# Patient Record
Sex: Female | Born: 1935 | Race: White | Hispanic: No | Smoking: Former smoker
Health system: Southern US, Community
[De-identification: ages and names within clinical notes are randomized; demographics above are authoritative.]

## PROBLEM LIST (undated history)

## (undated) DIAGNOSIS — Z79899 Other long term (current) drug therapy: Secondary | ICD-10-CM

## (undated) DIAGNOSIS — I5022 Chronic systolic (congestive) heart failure: Secondary | ICD-10-CM

## (undated) DIAGNOSIS — D509 Iron deficiency anemia, unspecified: Secondary | ICD-10-CM

## (undated) DIAGNOSIS — E785 Hyperlipidemia, unspecified: Secondary | ICD-10-CM

## (undated) DIAGNOSIS — F329 Major depressive disorder, single episode, unspecified: Secondary | ICD-10-CM

## (undated) DIAGNOSIS — I2510 Atherosclerotic heart disease of native coronary artery without angina pectoris: Secondary | ICD-10-CM

## (undated) DIAGNOSIS — F32A Depression, unspecified: Secondary | ICD-10-CM

## (undated) DIAGNOSIS — I447 Left bundle-branch block, unspecified: Secondary | ICD-10-CM

## (undated) DIAGNOSIS — F419 Anxiety disorder, unspecified: Secondary | ICD-10-CM

## (undated) DIAGNOSIS — Z7901 Long term (current) use of anticoagulants: Secondary | ICD-10-CM

## (undated) DIAGNOSIS — I10 Essential (primary) hypertension: Secondary | ICD-10-CM

## (undated) DIAGNOSIS — I495 Sick sinus syndrome: Secondary | ICD-10-CM

## (undated) DIAGNOSIS — I609 Nontraumatic subarachnoid hemorrhage, unspecified: Secondary | ICD-10-CM

## (undated) DIAGNOSIS — I4891 Unspecified atrial fibrillation: Secondary | ICD-10-CM

## (undated) DIAGNOSIS — I340 Nonrheumatic mitral (valve) insufficiency: Secondary | ICD-10-CM

## (undated) HISTORY — DX: Depression, unspecified: F32.A

## (undated) HISTORY — DX: Major depressive disorder, single episode, unspecified: F32.9

## (undated) HISTORY — DX: Essential (primary) hypertension: I10

## (undated) HISTORY — DX: Nontraumatic subarachnoid hemorrhage, unspecified: I60.9

## (undated) HISTORY — DX: Anxiety disorder, unspecified: F41.9

## (undated) HISTORY — DX: Other long term (current) drug therapy: Z79.899

## (undated) HISTORY — DX: Unspecified atrial fibrillation: I48.91

## (undated) HISTORY — PX: CORONARY ARTERY BYPASS GRAFT: SHX141

## (undated) HISTORY — DX: Atherosclerotic heart disease of native coronary artery without angina pectoris: I25.10

## (undated) HISTORY — DX: Nonrheumatic mitral (valve) insufficiency: I34.0

## (undated) HISTORY — DX: Sick sinus syndrome: I49.5

## (undated) HISTORY — DX: Hyperlipidemia, unspecified: E78.5

## (undated) HISTORY — PX: CORONARY STENT PLACEMENT: SHX1402

## (undated) HISTORY — PX: OTHER SURGICAL HISTORY: SHX169

## (undated) HISTORY — DX: Iron deficiency anemia, unspecified: D50.9

## (undated) HISTORY — DX: Long term (current) use of anticoagulants: Z79.01

## (undated) HISTORY — DX: Left bundle-branch block, unspecified: I44.7

## (undated) HISTORY — PX: MITRAL VALVE REPAIR: SHX2039

---

## 2004-06-21 ENCOUNTER — Inpatient Hospital Stay (HOSPITAL_COMMUNITY): Admission: AD | Admit: 2004-06-21 | Discharge: 2004-06-26 | Payer: Self-pay | Admitting: *Deleted

## 2004-06-22 ENCOUNTER — Encounter (INDEPENDENT_AMBULATORY_CARE_PROVIDER_SITE_OTHER): Payer: Self-pay | Admitting: *Deleted

## 2005-12-31 ENCOUNTER — Encounter: Admission: RE | Admit: 2005-12-31 | Discharge: 2005-12-31 | Payer: Self-pay | Admitting: Cardiology

## 2007-03-14 ENCOUNTER — Inpatient Hospital Stay (HOSPITAL_COMMUNITY): Admission: EM | Admit: 2007-03-14 | Discharge: 2007-03-21 | Payer: Self-pay | Admitting: Emergency Medicine

## 2007-03-14 ENCOUNTER — Ambulatory Visit: Payer: Self-pay | Admitting: Pulmonary Disease

## 2007-03-14 ENCOUNTER — Encounter (INDEPENDENT_AMBULATORY_CARE_PROVIDER_SITE_OTHER): Payer: Self-pay | Admitting: Nephrology

## 2007-03-19 HISTORY — PX: CARDIAC CATHETERIZATION: SHX172

## 2007-08-08 ENCOUNTER — Inpatient Hospital Stay (HOSPITAL_COMMUNITY): Admission: EM | Admit: 2007-08-08 | Discharge: 2007-08-10 | Payer: Self-pay | Admitting: Emergency Medicine

## 2007-11-16 ENCOUNTER — Inpatient Hospital Stay (HOSPITAL_COMMUNITY): Admission: EM | Admit: 2007-11-16 | Discharge: 2007-11-20 | Payer: Self-pay | Admitting: Emergency Medicine

## 2007-11-18 ENCOUNTER — Encounter (INDEPENDENT_AMBULATORY_CARE_PROVIDER_SITE_OTHER): Payer: Self-pay | Admitting: Internal Medicine

## 2007-11-20 ENCOUNTER — Emergency Department (HOSPITAL_COMMUNITY): Admission: EM | Admit: 2007-11-20 | Discharge: 2007-11-21 | Payer: Self-pay | Admitting: Emergency Medicine

## 2007-11-29 ENCOUNTER — Ambulatory Visit: Payer: Self-pay | Admitting: Cardiology

## 2007-11-29 ENCOUNTER — Inpatient Hospital Stay (HOSPITAL_COMMUNITY): Admission: EM | Admit: 2007-11-29 | Discharge: 2007-12-04 | Payer: Self-pay | Admitting: Emergency Medicine

## 2008-01-05 ENCOUNTER — Inpatient Hospital Stay (HOSPITAL_COMMUNITY): Admission: AD | Admit: 2008-01-05 | Discharge: 2008-01-08 | Payer: Self-pay | Admitting: Cardiology

## 2008-03-08 ENCOUNTER — Inpatient Hospital Stay (HOSPITAL_COMMUNITY): Admission: EM | Admit: 2008-03-08 | Discharge: 2008-03-12 | Payer: Self-pay | Admitting: Emergency Medicine

## 2008-03-25 ENCOUNTER — Emergency Department (HOSPITAL_COMMUNITY): Admission: EM | Admit: 2008-03-25 | Discharge: 2008-03-26 | Payer: Self-pay | Admitting: Emergency Medicine

## 2008-04-07 HISTORY — PX: CORONARY ARTERY BYPASS GRAFT: SHX141

## 2008-05-03 ENCOUNTER — Inpatient Hospital Stay (HOSPITAL_COMMUNITY): Admission: EM | Admit: 2008-05-03 | Discharge: 2008-05-30 | Payer: Self-pay | Admitting: Emergency Medicine

## 2008-05-03 ENCOUNTER — Encounter: Payer: Self-pay | Admitting: Critical Care Medicine

## 2008-05-03 ENCOUNTER — Ambulatory Visit: Payer: Self-pay | Admitting: Critical Care Medicine

## 2008-05-07 ENCOUNTER — Encounter: Payer: Self-pay | Admitting: Cardiothoracic Surgery

## 2008-05-08 ENCOUNTER — Ambulatory Visit: Payer: Self-pay | Admitting: Cardiothoracic Surgery

## 2008-05-10 ENCOUNTER — Encounter: Payer: Self-pay | Admitting: Cardiothoracic Surgery

## 2008-05-11 ENCOUNTER — Encounter: Payer: Self-pay | Admitting: Cardiothoracic Surgery

## 2008-05-19 HISTORY — PX: PACEMAKER INSERTION: SHX728

## 2008-05-26 ENCOUNTER — Ambulatory Visit: Payer: Self-pay | Admitting: Physical Medicine & Rehabilitation

## 2008-06-17 ENCOUNTER — Encounter: Admission: RE | Admit: 2008-06-17 | Discharge: 2008-06-17 | Payer: Self-pay | Admitting: Cardiology

## 2008-06-21 HISTORY — PX: TRANSTHORACIC ECHOCARDIOGRAM: SHX275

## 2008-06-24 ENCOUNTER — Ambulatory Visit: Payer: Self-pay | Admitting: Cardiothoracic Surgery

## 2008-06-24 ENCOUNTER — Encounter: Admission: RE | Admit: 2008-06-24 | Discharge: 2008-06-24 | Payer: Self-pay | Admitting: Cardiothoracic Surgery

## 2008-07-08 ENCOUNTER — Encounter (HOSPITAL_COMMUNITY): Admission: RE | Admit: 2008-07-08 | Discharge: 2008-10-06 | Payer: Self-pay | Admitting: Cardiology

## 2008-09-23 ENCOUNTER — Ambulatory Visit: Payer: Self-pay | Admitting: Cardiothoracic Surgery

## 2008-09-23 ENCOUNTER — Encounter: Admission: RE | Admit: 2008-09-23 | Discharge: 2008-09-23 | Payer: Self-pay | Admitting: Cardiothoracic Surgery

## 2008-10-08 ENCOUNTER — Encounter (HOSPITAL_COMMUNITY): Admission: RE | Admit: 2008-10-08 | Discharge: 2008-10-22 | Payer: Self-pay | Admitting: Cardiology

## 2008-10-23 ENCOUNTER — Encounter (HOSPITAL_COMMUNITY): Admission: RE | Admit: 2008-10-23 | Discharge: 2009-01-21 | Payer: Self-pay | Admitting: Cardiology

## 2009-02-07 ENCOUNTER — Encounter (HOSPITAL_COMMUNITY): Admission: RE | Admit: 2009-02-07 | Discharge: 2009-05-08 | Payer: Self-pay | Admitting: Cardiology

## 2009-05-09 ENCOUNTER — Encounter (HOSPITAL_COMMUNITY): Admission: RE | Admit: 2009-05-09 | Discharge: 2009-08-07 | Payer: Self-pay | Admitting: Cardiology

## 2009-05-11 IMAGING — CR DG CHEST 2V
2 series · 2 of 2 positions shown · non-contrast
Comparison: 11/18/07 and earlier

CLINICAL DATA: 77-year-old female with chest tightness.  
 CHEST ? 2 VIEW:

[w chest pa]
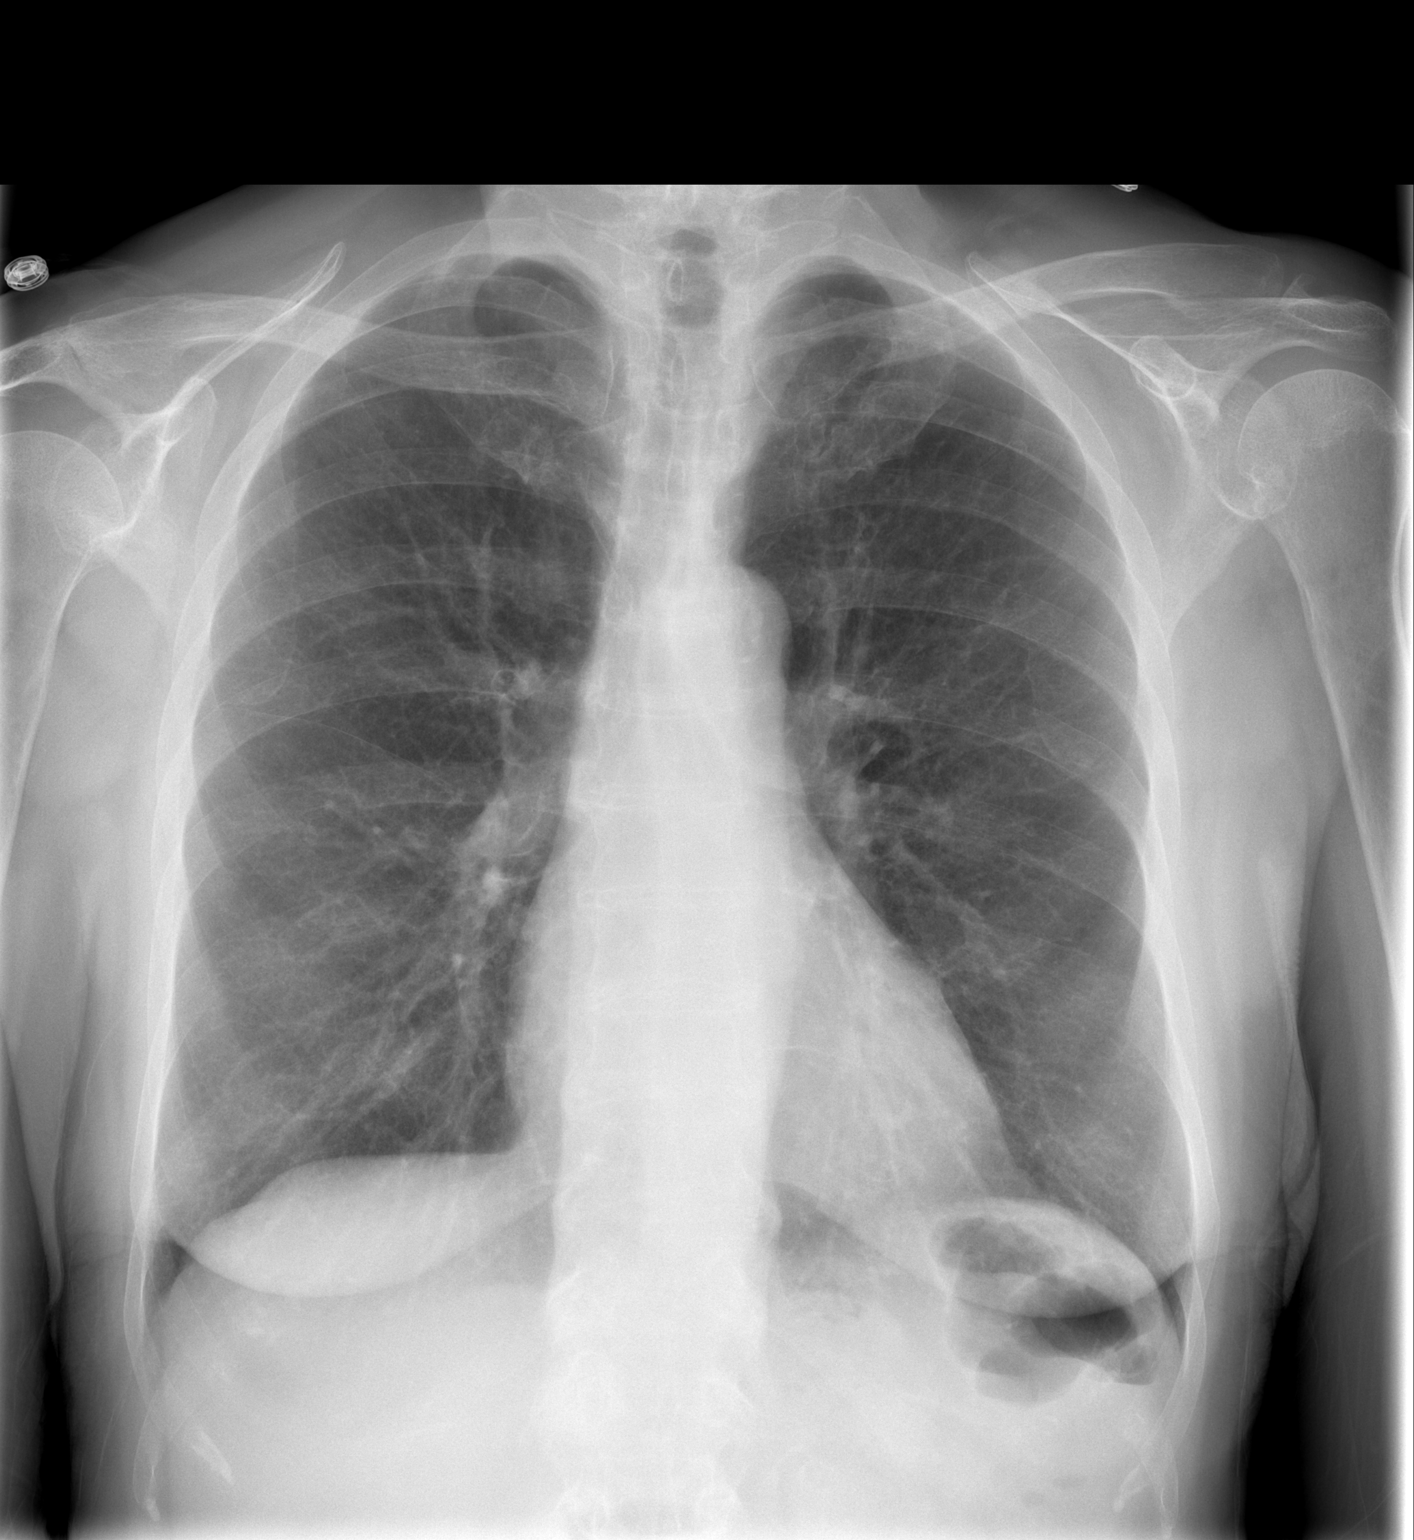

[w chest lat]
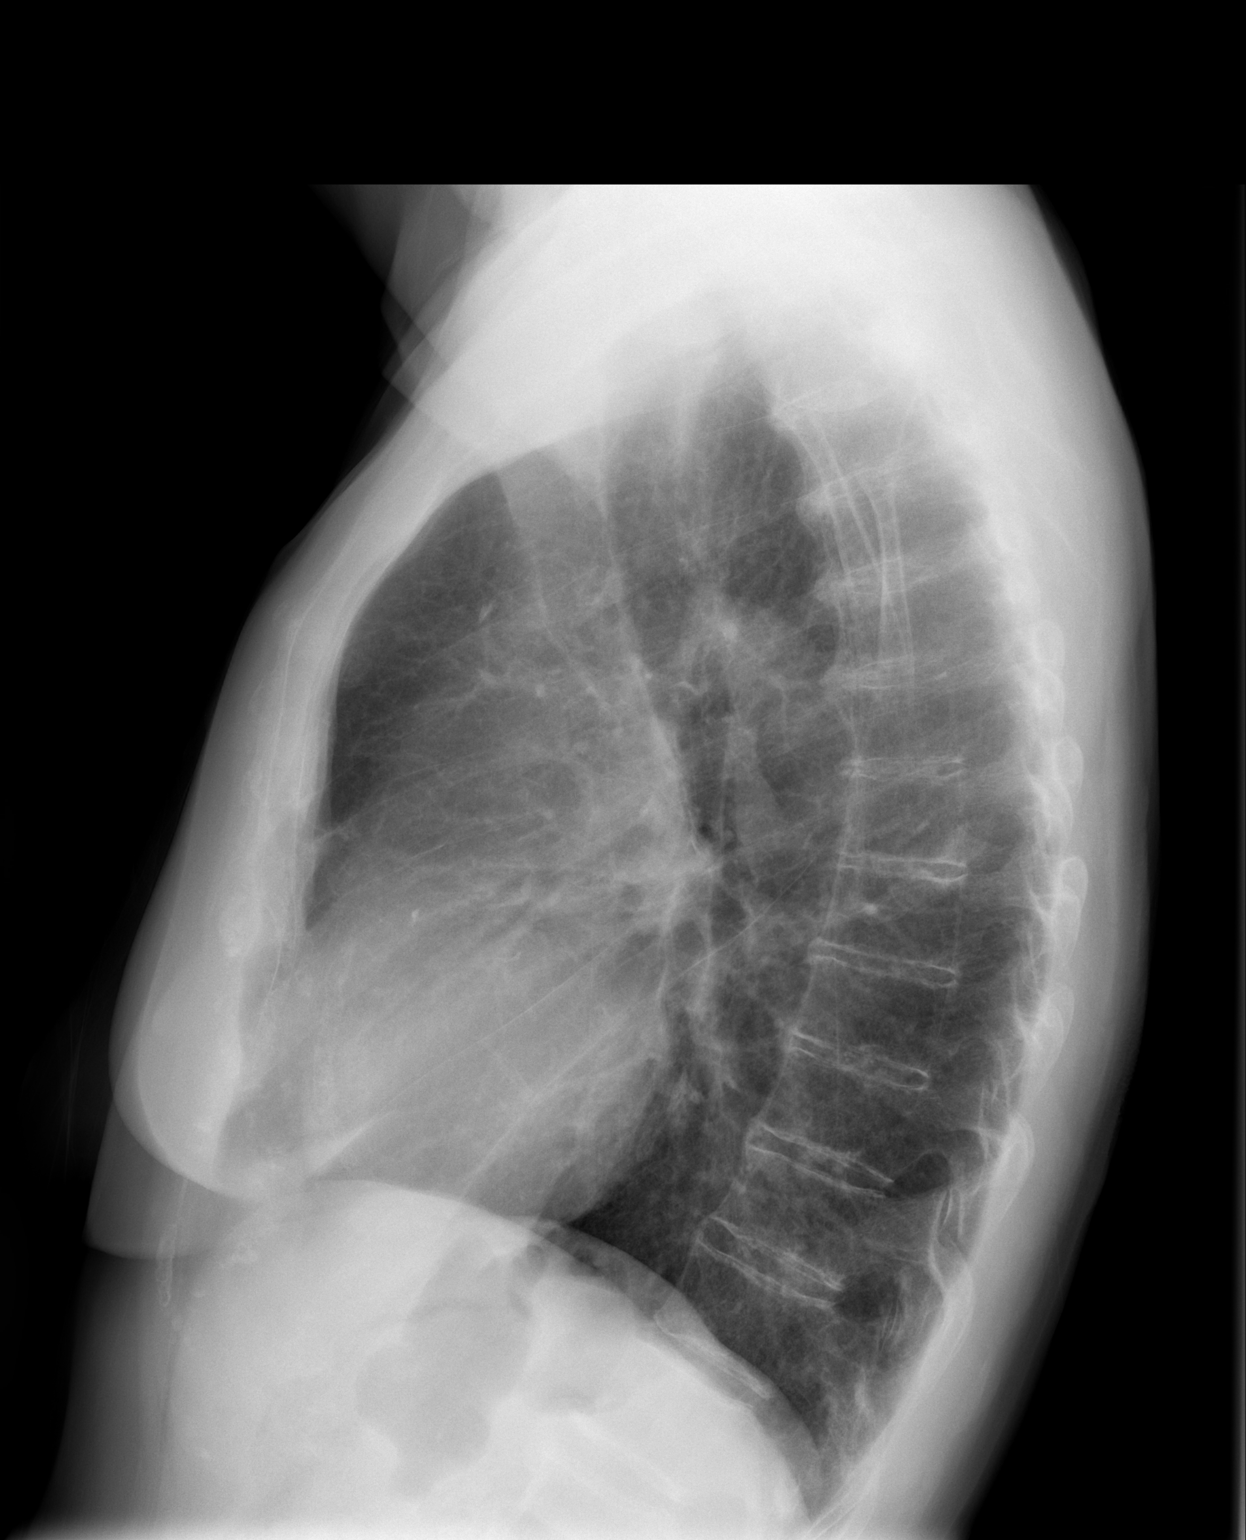

[2 of 2 positions shown; findings below may reference images not displayed]

FINDINGS: Further continued improved ventilation in both lungs.  Interval resolved pleural effusions.  Cardiac size and mediastinal contours remain within normal limits.  No pneumothorax.  Attenuation of vascular markings suggestive of emphysema reidentified.  No acute airspace opacity or consolidation.  Diffuse osteopenia.  No acute osseous abnormality.
IMPRESSION: Interval resolution of small pleural effusions and continued improved ventilation.  No acute cardiopulmonary abnormality.

## 2009-08-08 ENCOUNTER — Encounter (HOSPITAL_COMMUNITY): Admission: RE | Admit: 2009-08-08 | Discharge: 2009-11-06 | Payer: Self-pay | Admitting: Cardiology

## 2009-10-22 IMAGING — CR DG CHEST 1V PORT
1 series · 1 of 1 positions shown · non-contrast
Comparison: 05/03/2008

CLINICAL DATA: Intubation

PORTABLE CHEST - 1 VIEW

[AP]
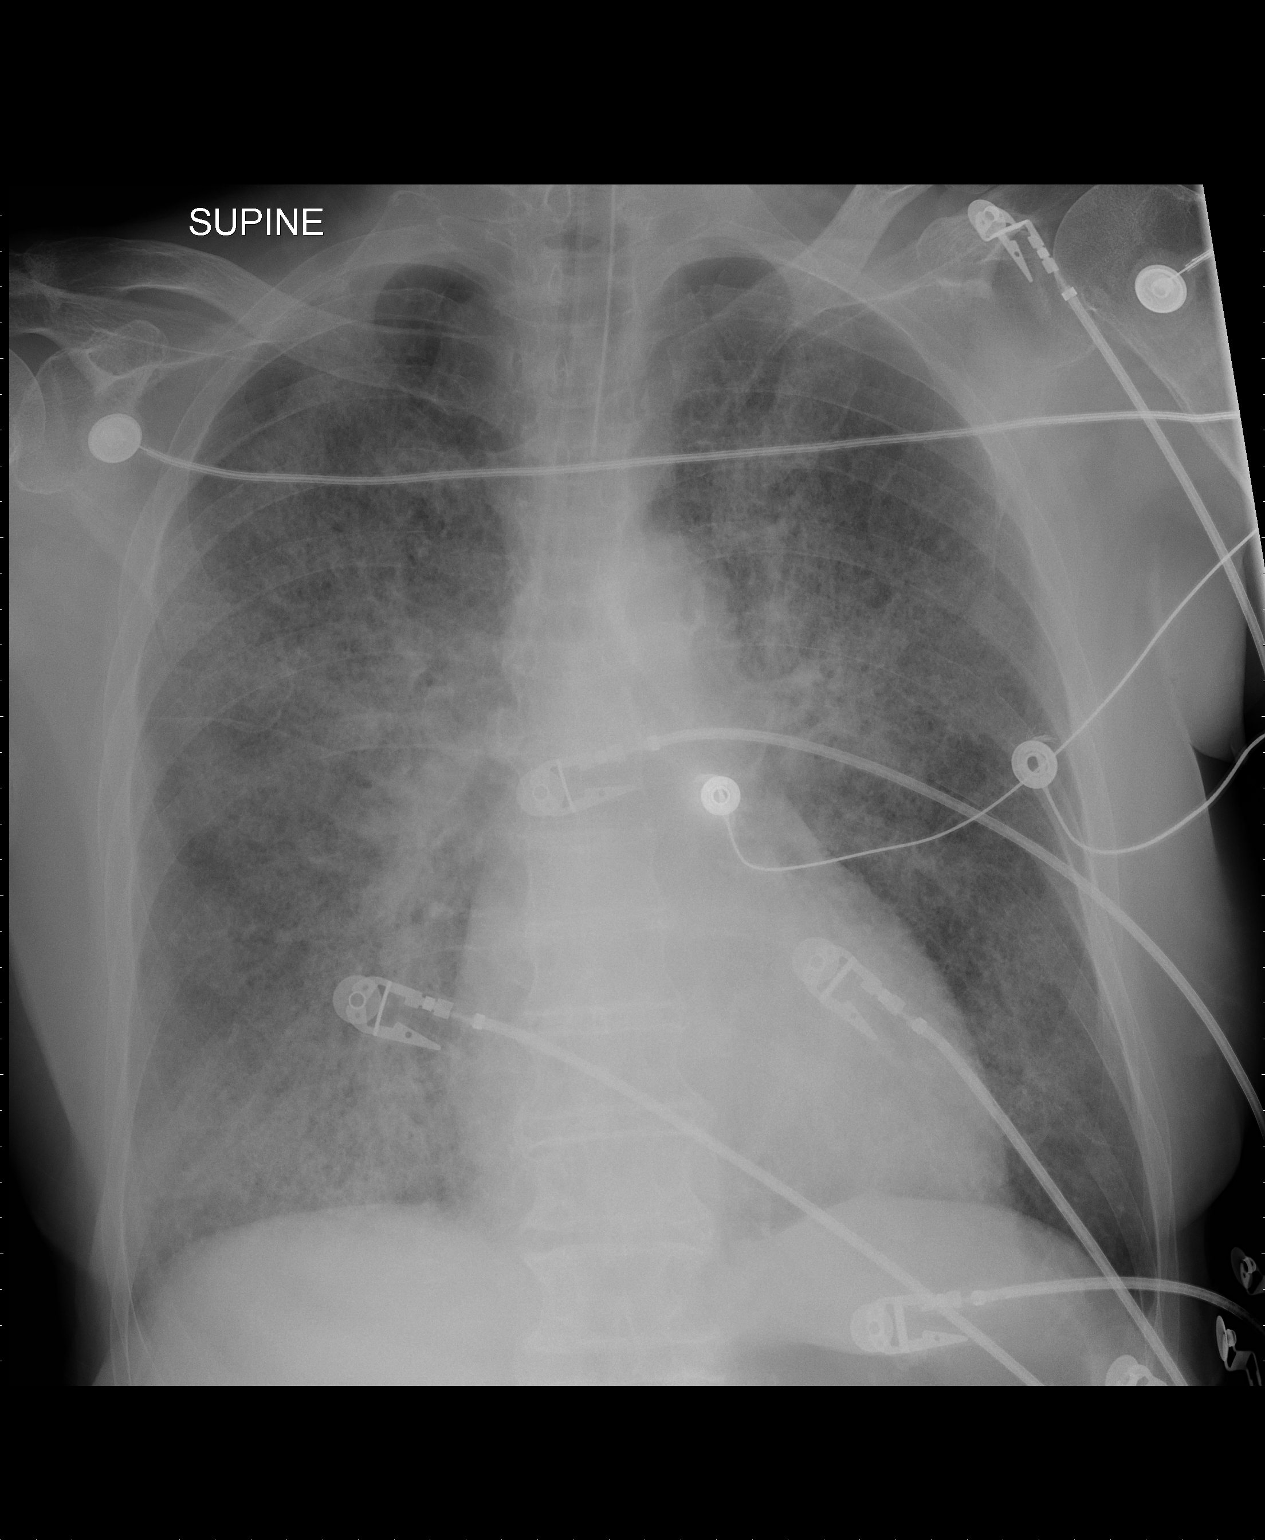

[1 of 1 positions shown; findings below may reference images not displayed]

FINDINGS: Endotracheal tube is 4 cm above the carina.  Continued
diffuse bilateral airspace disease compatible with edema.  Mild
cardiomegaly stable.
IMPRESSION: Endotracheal tube 4 cm above the carina.  Otherwise no change.

## 2009-10-22 IMAGING — CR DG CHEST 1V PORT
1 series · 1 of 1 positions shown · non-contrast
Comparison: 03/26/2008

CLINICAL DATA: Abdominal pain, respiratory distress

PORTABLE CHEST - 1 VIEW

[AP]
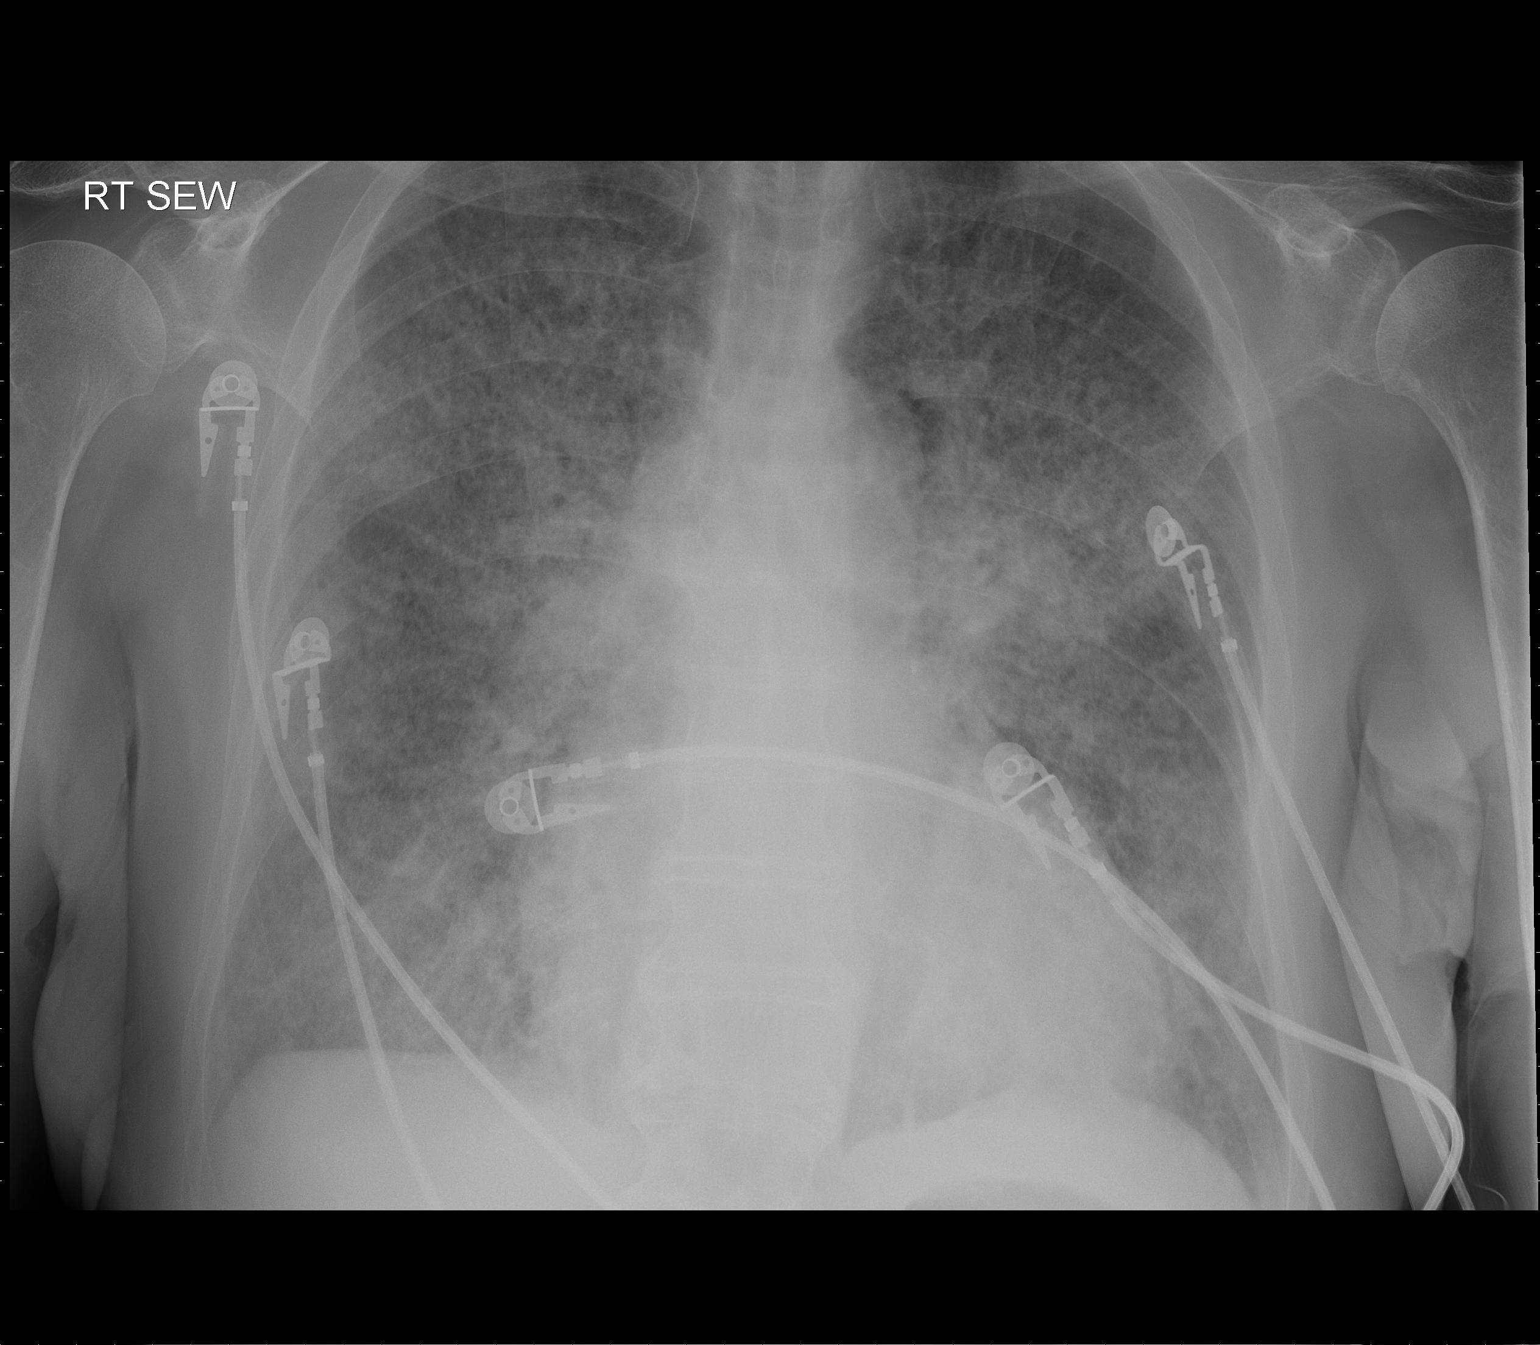

[1 of 1 positions shown; findings below may reference images not displayed]

FINDINGS: Diffuse bilateral airspace disease noted, worsened since
prior study.  There is cardiomegaly.  No effusions.
IMPRESSION: Moderate CHF.

## 2009-10-22 IMAGING — CR DG ABD PORTABLE 1V
1 series · 1 of 1 positions shown · non-contrast
Comparison: None.

CLINICAL DATA: Panda tube placement.

ABDOMEN - 1 VIEW

[view not recorded]
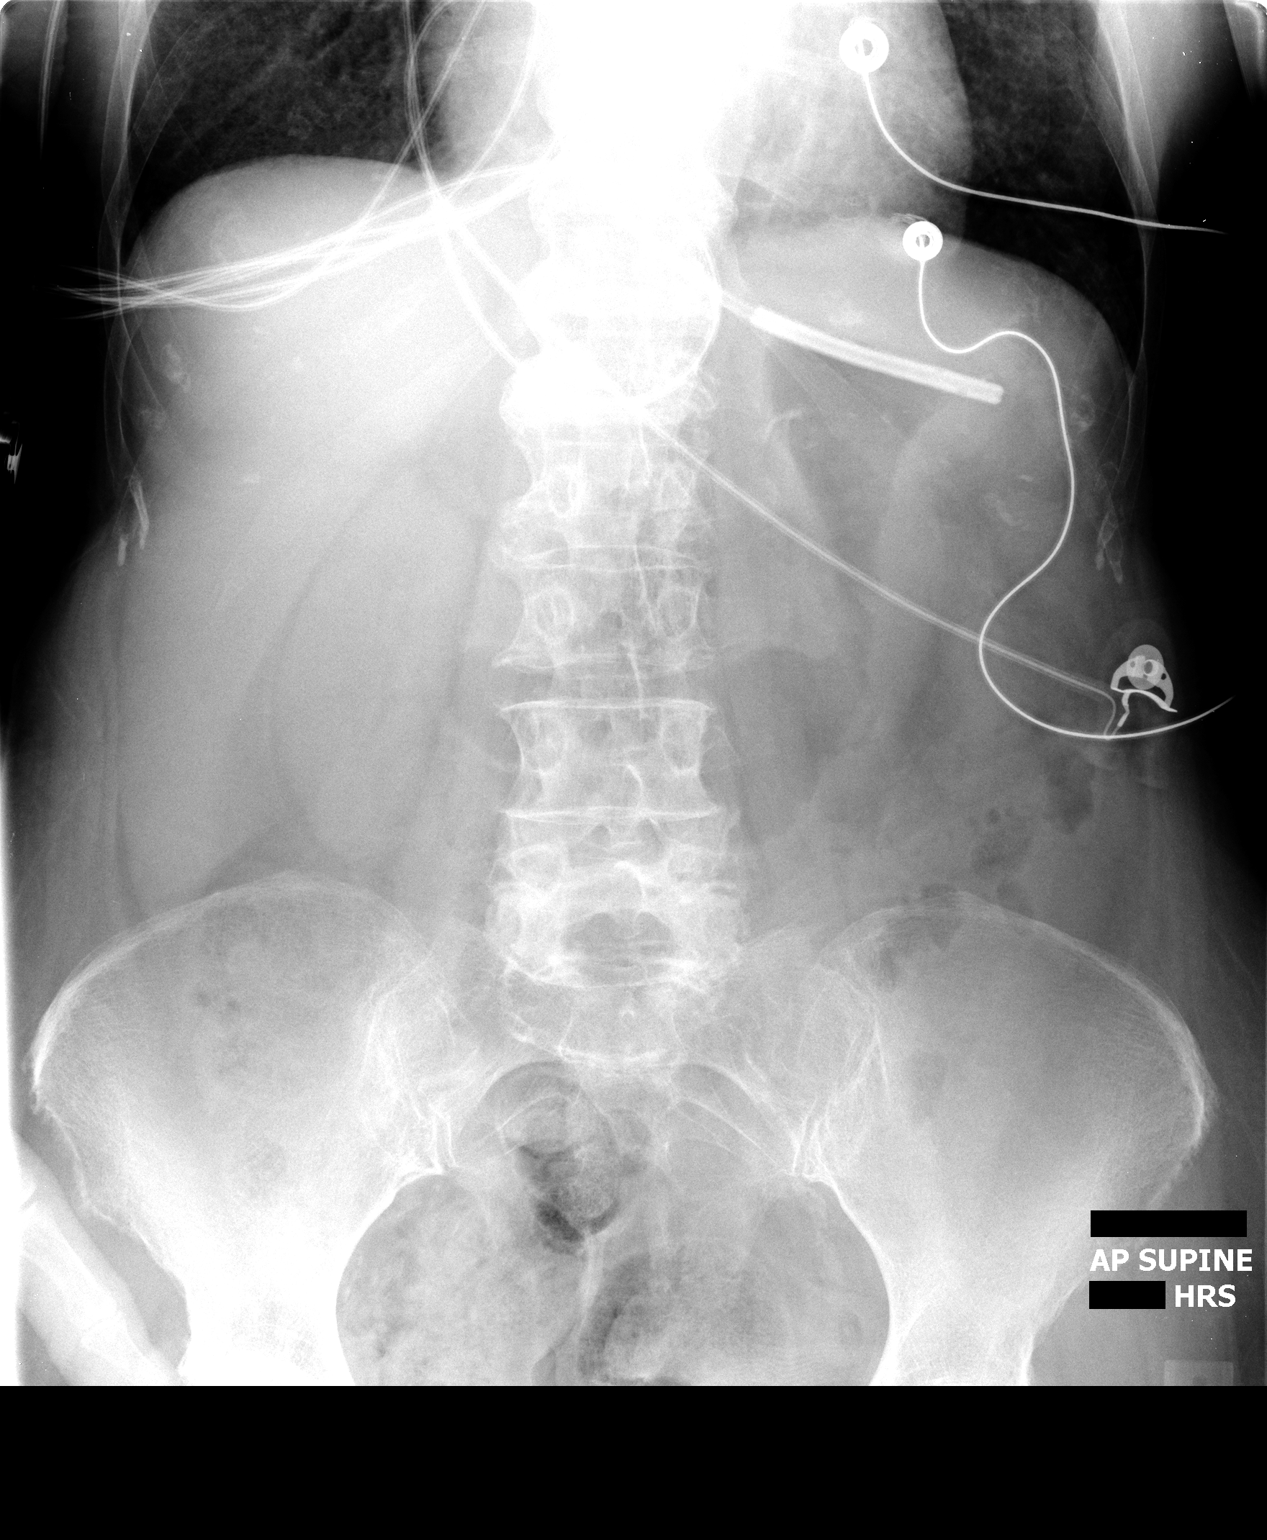

[1 of 1 positions shown; findings below may reference images not displayed]

FINDINGS: Panda tube tip gastric fundus.  Mild loss of height L1
vertebra.  Nonspecific bowel gas pattern.
IMPRESSION: Panda tube tip gastric fundus.

## 2009-11-08 ENCOUNTER — Encounter (HOSPITAL_COMMUNITY): Admission: RE | Admit: 2009-11-08 | Discharge: 2010-02-06 | Payer: Self-pay | Admitting: Cardiology

## 2010-02-07 ENCOUNTER — Encounter (HOSPITAL_COMMUNITY): Admission: RE | Admit: 2010-02-07 | Discharge: 2010-05-08 | Payer: Self-pay | Admitting: Cardiology

## 2010-05-09 ENCOUNTER — Encounter (HOSPITAL_COMMUNITY): Admission: RE | Admit: 2010-05-09 | Discharge: 2010-08-07 | Payer: Self-pay | Admitting: Cardiology

## 2010-05-15 ENCOUNTER — Ambulatory Visit: Payer: Self-pay | Admitting: Cardiovascular Disease

## 2010-06-22 ENCOUNTER — Ambulatory Visit: Payer: Self-pay | Admitting: Cardiology

## 2010-06-30 ENCOUNTER — Ambulatory Visit: Payer: Self-pay | Admitting: Cardiology

## 2010-06-30 ENCOUNTER — Encounter: Payer: Self-pay | Admitting: Internal Medicine

## 2010-07-20 ENCOUNTER — Ambulatory Visit: Payer: Self-pay | Admitting: Cardiology

## 2010-08-05 ENCOUNTER — Encounter: Payer: Self-pay | Admitting: Internal Medicine

## 2010-08-08 ENCOUNTER — Encounter (HOSPITAL_COMMUNITY)
Admission: RE | Admit: 2010-08-08 | Discharge: 2010-10-07 | Payer: Self-pay | Source: Home / Self Care | Attending: Cardiology | Admitting: Cardiology

## 2010-08-18 ENCOUNTER — Ambulatory Visit: Payer: Self-pay | Admitting: Cardiology

## 2010-09-06 ENCOUNTER — Ambulatory Visit: Payer: Self-pay | Admitting: Internal Medicine

## 2010-09-06 DIAGNOSIS — I2510 Atherosclerotic heart disease of native coronary artery without angina pectoris: Secondary | ICD-10-CM | POA: Insufficient documentation

## 2010-09-06 DIAGNOSIS — I495 Sick sinus syndrome: Secondary | ICD-10-CM

## 2010-09-06 DIAGNOSIS — I4891 Unspecified atrial fibrillation: Secondary | ICD-10-CM | POA: Insufficient documentation

## 2010-10-03 ENCOUNTER — Ambulatory Visit: Payer: Self-pay | Admitting: Cardiology

## 2010-10-16 ENCOUNTER — Encounter (HOSPITAL_COMMUNITY)
Admission: RE | Admit: 2010-10-16 | Discharge: 2010-11-07 | Payer: Self-pay | Source: Home / Self Care | Attending: Cardiology | Admitting: Cardiology

## 2010-11-01 ENCOUNTER — Ambulatory Visit: Payer: Self-pay | Admitting: Cardiology

## 2010-11-08 ENCOUNTER — Encounter (HOSPITAL_COMMUNITY): Payer: Self-pay | Attending: Cardiology

## 2010-11-08 DIAGNOSIS — E785 Hyperlipidemia, unspecified: Secondary | ICD-10-CM | POA: Insufficient documentation

## 2010-11-08 DIAGNOSIS — I2789 Other specified pulmonary heart diseases: Secondary | ICD-10-CM | POA: Insufficient documentation

## 2010-11-08 DIAGNOSIS — Z7901 Long term (current) use of anticoagulants: Secondary | ICD-10-CM | POA: Insufficient documentation

## 2010-11-08 DIAGNOSIS — I472 Ventricular tachycardia, unspecified: Secondary | ICD-10-CM | POA: Insufficient documentation

## 2010-11-08 DIAGNOSIS — R092 Respiratory arrest: Secondary | ICD-10-CM | POA: Insufficient documentation

## 2010-11-08 DIAGNOSIS — I447 Left bundle-branch block, unspecified: Secondary | ICD-10-CM | POA: Insufficient documentation

## 2010-11-08 DIAGNOSIS — I501 Left ventricular failure: Secondary | ICD-10-CM | POA: Insufficient documentation

## 2010-11-08 DIAGNOSIS — I509 Heart failure, unspecified: Secondary | ICD-10-CM | POA: Insufficient documentation

## 2010-11-08 DIAGNOSIS — Z950 Presence of cardiac pacemaker: Secondary | ICD-10-CM | POA: Insufficient documentation

## 2010-11-08 DIAGNOSIS — Z8674 Personal history of sudden cardiac arrest: Secondary | ICD-10-CM | POA: Insufficient documentation

## 2010-11-08 DIAGNOSIS — I059 Rheumatic mitral valve disease, unspecified: Secondary | ICD-10-CM | POA: Insufficient documentation

## 2010-11-08 DIAGNOSIS — Z951 Presence of aortocoronary bypass graft: Secondary | ICD-10-CM | POA: Insufficient documentation

## 2010-11-08 DIAGNOSIS — F3289 Other specified depressive episodes: Secondary | ICD-10-CM | POA: Insufficient documentation

## 2010-11-08 DIAGNOSIS — I959 Hypotension, unspecified: Secondary | ICD-10-CM | POA: Insufficient documentation

## 2010-11-08 DIAGNOSIS — Z9861 Coronary angioplasty status: Secondary | ICD-10-CM | POA: Insufficient documentation

## 2010-11-08 DIAGNOSIS — Z5189 Encounter for other specified aftercare: Secondary | ICD-10-CM | POA: Insufficient documentation

## 2010-11-08 DIAGNOSIS — D509 Iron deficiency anemia, unspecified: Secondary | ICD-10-CM | POA: Insufficient documentation

## 2010-11-08 DIAGNOSIS — Z7982 Long term (current) use of aspirin: Secondary | ICD-10-CM | POA: Insufficient documentation

## 2010-11-08 DIAGNOSIS — I4729 Other ventricular tachycardia: Secondary | ICD-10-CM | POA: Insufficient documentation

## 2010-11-08 DIAGNOSIS — I2589 Other forms of chronic ischemic heart disease: Secondary | ICD-10-CM | POA: Insufficient documentation

## 2010-11-08 DIAGNOSIS — D890 Polyclonal hypergammaglobulinemia: Secondary | ICD-10-CM | POA: Insufficient documentation

## 2010-11-08 DIAGNOSIS — I2510 Atherosclerotic heart disease of native coronary artery without angina pectoris: Secondary | ICD-10-CM | POA: Insufficient documentation

## 2010-11-08 DIAGNOSIS — Z9889 Other specified postprocedural states: Secondary | ICD-10-CM | POA: Insufficient documentation

## 2010-11-08 DIAGNOSIS — F329 Major depressive disorder, single episode, unspecified: Secondary | ICD-10-CM | POA: Insufficient documentation

## 2010-11-08 DIAGNOSIS — I252 Old myocardial infarction: Secondary | ICD-10-CM | POA: Insufficient documentation

## 2010-11-08 DIAGNOSIS — I4891 Unspecified atrial fibrillation: Secondary | ICD-10-CM | POA: Insufficient documentation

## 2010-11-09 NOTE — Cardiovascular Report (Signed)
Summary: Office Visit   Office Visit   Imported By: Roderic Ovens 09/12/2010 10:42:46  _____________________________________________________________________  External Attachment:    Type:   Image     Comment:   External Document

## 2010-11-09 NOTE — Letter (Signed)
Summary: [facility]  [facility]   Imported By: Marylou Mccoy 09/15/2010 17:32:58  _____________________________________________________________________  External Attachment:    Type:   Image     Comment:   External Document

## 2010-11-09 NOTE — Miscellaneous (Signed)
Summary: Device preload  Clinical Lists Changes  Observations: Added new observation of PPM INDICATN: A-fib (08/05/2010 9:47) Added new observation of MAGNET RTE: BOL 85 ERI 65 (08/05/2010 9:47) Added new observation of PPMLEADSTAT2: active (08/05/2010 9:47) Added new observation of PPMLEADSER2: [id number] (08/05/2010 9:47) Added new observation of PPMLEADMOD2: 5076  (08/05/2010 9:47) Added new observation of PPMLEADDOI2: 05/19/2008  (08/05/2010 9:47) Added new observation of PPMLEADLOC2: RV  (08/05/2010 9:47) Added new observation of PPMLEADSTAT1: active  (08/05/2010 9:47) Added new observation of PPMLEADSER1: [id number]  (08/05/2010 9:47) Added new observation of PPMLEADMOD1: 5076  (08/05/2010 9:47) Added new observation of PPMLEADDOI1: 05/19/2008  (08/05/2010 9:47) Added new observation of PPMLEADLOC1: RA  (08/05/2010 9:47) Added new observation of PPM DOI: 05/19/2008  (08/05/2010 9:47) Added new observation of PPM SERL#: [id number]  (08/05/2010 9:47) Added new observation of PPM MODL#: [id number]  (08/05/2010 9:47) Added new observation of PACEMAKERMFG: Medtronic  (08/05/2010 9:47) Added new observation of PPM IMP MD: Charlynn Court  (08/05/2010 9:47) Added new observation of PPM REFER MD: Vonna Drafts  (08/05/2010 9:47) Added new observation of PACEMAKER MD: Hillis Range, MD  (08/05/2010 9:47)      PPM Specifications Following MD:  Hillis Range, MD     Referring MD:  Vonna Drafts PPM Vendor:  Medtronic     PPM Model Number:  [id number]     PPM Serial Number:  [id number] PPM DOI:  05/19/2008     PPM Implanting MD:  Charlynn Court  Lead 1    Location: RA     DOI: 05/19/2008     Model #: [id number]     Serial #: [id number]     Status: active Lead 2    Location: RV     DOI: 05/19/2008     Model #: [id number]     Serial #: [id number]     Status: active  Magnet Response Rate:  BOL 85 ERI 65  Indications:  A-fib

## 2010-11-09 NOTE — Assessment & Plan Note (Signed)
Summary: pacer check/medtronic   Visit Type:  Follow-up Primary Provider:  Nyoka Cowden MD   History of Present Illness: [name] is a pleasant 77 yo WF with a h/o atrial fibrillation and bradycardia s/p PPM implantation (MDT) who presents today to establish EP care.  She reports doing well since CABG, mitral valve repair, and maze in 2009.  She underwent pacemaker implantation 05/19/2008 by Dr Reyes Ivan.  She remains very active .  Her primary concern is with her spouse who is terminally ill.  She denies symptoms of palpitations, chest pain, shortness of breath, orthopnea, PND, lower extremity edema, dizziness, presyncope, syncope, or neurologic sequela. The patient is tolerating medications without difficulties and is otherwise without complaint today.   Current Medications (verified): 1)  Crestor 20 Mg Tabs (Rosuvastatin Calcium) .... Take One Tablet By Mouth Daily. 2)  Digoxin 0.125 Mg Tabs (Digoxin) .... Take A Half  Tablet By Mouth Daily 3)  Furosemide 20 Mg Tabs (Furosemide) .... Take One Tablet By Mouth Daily. 4)  Potassium Chloride Crys Cr 20 Meq Cr-Tabs (Potassium Chloride Crys Cr) .... Take One Tablet By Mouth Daily 5)  Metoprolol Succinate 25 Mg Xr24h-Tab (Metoprolol Succinate) .... Take One Tablet By Mouth Daily 6)  Spironolactone 25 Mg Tabs (Spironolactone) .... Take One Tablet By Mouth Daily 7)  Tikosyn 250 Mcg Caps (Dofetilide) .... Two Times A Day 8)  Warfarin Sodium 4 Mg Tabs (Warfarin Sodium) .... Use As Directed By Anticoagulation Clinic 9)  Protonix 40 Mg Solr (Pantoprazole Sodium) .... Once Daily  Allergies (verified): No Known Drug Allergies  Past History:  Past Medical History: CAD s/p CABG 2009 s/p mitral valve repair 2009 atrial fibrillation s/p MAZE 2009 s/p PPM 05/19/2008 HTN HL  Past Surgical History: s/p CABG, mitral valve repair, and MAZE 8/09 s/p PPM (MDT) by Dr Reyes Ivan 2009  Family History: she is unaware of significant FH  Social History: Pt  lives with spouse in [place] She denies tobacco or ETOH  Review of Systems       All systems are reviewed and negative except as listed in the HPI.   Vital Signs:  Patient profile:   77 year old female Height:      66 inches Weight:      144 pounds BMI:     23.33 Pulse rate:   64 / minute BP sitting:   120 / 60  (left arm)  Vitals Entered By: Laurance Flatten CMA (September 06, 2010 11:34 AM)  Physical Exam  General:  Well developed, well nourished, in no acute distress. Head:  normocephalic and atraumatic Eyes:  PERRLA/EOM intact; conjunctiva and lids normal. Mouth:  Teeth, gums and palate normal. Oral mucosa normal. Neck:  supple Chest Wall:  pacemaker pocket is well healed Lungs:  Clear bilaterally to auscultation and percussion. Heart:  Non-displaced PMI, chest non-tender; regular rate and rhythm, S1, S2 without murmurs, rubs or gallops. Carotid upstroke normal, no bruit. Normal abdominal aortic size, no bruits. Femorals normal pulses, no bruits. Pedals normal pulses. No edema, no varicosities. Abdomen:  Bowel sounds positive; abdomen soft and non-tender without masses, organomegaly, or hernias noted. No hepatosplenomegaly. Msk:  Back normal, normal gait. Muscle strength and tone normal. Pulses:  pulses normal in all 4 extremities Extremities:  No clubbing or cyanosis. Neurologic:  Alert and oriented x 3. Psych:  tearful and saddened by illness of spouse   PPM Specifications Following MD:  Hillis Range, MD     Referring MD:  Vonna Drafts PPM Vendor:  Medtronic     PPM Model Number:  [id number]     PPM Serial Number:  [id number] PPM DOI:  05/19/2008     PPM Implanting MD:  Charlynn Court  Lead 1    Location: RA     DOI: 05/19/2008     Model #: [id number]     Serial #: [id number]     Status: active Lead 2    Location: RV     DOI: 05/19/2008     Model #: 9629     Serial #: [id number]     Status: active  Magnet Response Rate:  BOL 85 ERI 65  Indications:  A-fib   PPM Follow  Up Battery Voltage:  2.97 V       PPM Device Measurements Atrium  Amplitude: 1.2 mV, Impedance: 376 ohms, Threshold: 2.5 V at 1.0 msec Right Ventricle  Amplitude: 20.3 mV, Impedance: 528 ohms, Threshold: 1.0 V at 0.4 msec  Episodes Kim Episodes:  0     Ventricular High Rate:  1     Atrial Pacing:  4.3%     Ventricular Pacing:  0.2%  Parameters Mode:  DDDR     Lower Rate Limit:  60     Upper Rate Limit:  120 Paced AV Delay:  290     Sensed AV Delay:  260 Tech Comments:  [facility]---1 NST EPISODE LASTING 14 BEATS--SVT.  NORMAL DEVICE FUNCTION.  CHANGED RA OUTPUT FROM 6 TO 4 V. PT NOT INTERESTED IN [facility] PREFERS OFFICE VISITS. ROV 6 MTHS W/DEVICE CLINIC AND ROV IN 12 MTHS W/JA.  Vella Kohler  September 06, 2010 11:37 AM MD Comments:  agree  Impression & Recommendations:  Problem # 1:  BRADYCARDIA (ICD-427.89) normal pacemaker function as above  Problem # 2:  ATRIAL FIBRILLATION (ICD-427.31) maintaining sinus rhythm continue coumadin longterm  Problem # 3:  CAD (ICD-414.00) no symptoms of ischemia no changes today  Patient Instructions: 1)  Your physician wants you to follow-up in: 6 months with device clinic   You will receive a reminder letter in the mail two months in advance. If you don't receive a letter, please call our office to schedule the follow-up appointment.

## 2010-11-10 ENCOUNTER — Encounter (HOSPITAL_COMMUNITY): Payer: Self-pay

## 2010-11-13 ENCOUNTER — Encounter (HOSPITAL_COMMUNITY): Payer: Self-pay

## 2010-11-15 ENCOUNTER — Encounter (HOSPITAL_COMMUNITY): Payer: Self-pay

## 2010-11-17 ENCOUNTER — Encounter (HOSPITAL_COMMUNITY): Payer: Self-pay

## 2010-11-20 ENCOUNTER — Encounter (HOSPITAL_COMMUNITY): Payer: Self-pay

## 2010-11-22 ENCOUNTER — Encounter (HOSPITAL_COMMUNITY): Payer: Self-pay

## 2010-11-24 ENCOUNTER — Encounter (HOSPITAL_COMMUNITY): Payer: Self-pay

## 2010-11-24 DIAGNOSIS — E782 Mixed hyperlipidemia: Secondary | ICD-10-CM | POA: Insufficient documentation

## 2010-11-24 DIAGNOSIS — I480 Paroxysmal atrial fibrillation: Secondary | ICD-10-CM | POA: Insufficient documentation

## 2010-11-27 ENCOUNTER — Encounter (HOSPITAL_COMMUNITY): Payer: Medicare Other

## 2010-11-27 ENCOUNTER — Other Ambulatory Visit (INDEPENDENT_AMBULATORY_CARE_PROVIDER_SITE_OTHER): Payer: Medicare Other

## 2010-11-27 DIAGNOSIS — Z7901 Long term (current) use of anticoagulants: Secondary | ICD-10-CM

## 2010-11-27 DIAGNOSIS — I4891 Unspecified atrial fibrillation: Secondary | ICD-10-CM

## 2010-11-29 ENCOUNTER — Encounter (HOSPITAL_COMMUNITY): Payer: Medicare Other

## 2010-12-01 ENCOUNTER — Encounter (HOSPITAL_COMMUNITY): Payer: Self-pay

## 2010-12-04 ENCOUNTER — Encounter (HOSPITAL_COMMUNITY): Payer: Medicare Other

## 2010-12-06 ENCOUNTER — Encounter (HOSPITAL_COMMUNITY): Payer: Medicare Other

## 2010-12-08 ENCOUNTER — Encounter (HOSPITAL_COMMUNITY): Payer: Self-pay | Attending: Cardiology

## 2010-12-08 DIAGNOSIS — I059 Rheumatic mitral valve disease, unspecified: Secondary | ICD-10-CM | POA: Insufficient documentation

## 2010-12-08 DIAGNOSIS — I509 Heart failure, unspecified: Secondary | ICD-10-CM | POA: Insufficient documentation

## 2010-12-08 DIAGNOSIS — F3289 Other specified depressive episodes: Secondary | ICD-10-CM | POA: Insufficient documentation

## 2010-12-08 DIAGNOSIS — Z950 Presence of cardiac pacemaker: Secondary | ICD-10-CM | POA: Insufficient documentation

## 2010-12-08 DIAGNOSIS — D890 Polyclonal hypergammaglobulinemia: Secondary | ICD-10-CM | POA: Insufficient documentation

## 2010-12-08 DIAGNOSIS — I447 Left bundle-branch block, unspecified: Secondary | ICD-10-CM | POA: Insufficient documentation

## 2010-12-08 DIAGNOSIS — Z7901 Long term (current) use of anticoagulants: Secondary | ICD-10-CM | POA: Insufficient documentation

## 2010-12-08 DIAGNOSIS — F329 Major depressive disorder, single episode, unspecified: Secondary | ICD-10-CM | POA: Insufficient documentation

## 2010-12-08 DIAGNOSIS — Z5189 Encounter for other specified aftercare: Secondary | ICD-10-CM | POA: Insufficient documentation

## 2010-12-08 DIAGNOSIS — I2789 Other specified pulmonary heart diseases: Secondary | ICD-10-CM | POA: Insufficient documentation

## 2010-12-08 DIAGNOSIS — Z951 Presence of aortocoronary bypass graft: Secondary | ICD-10-CM | POA: Insufficient documentation

## 2010-12-08 DIAGNOSIS — I959 Hypotension, unspecified: Secondary | ICD-10-CM | POA: Insufficient documentation

## 2010-12-08 DIAGNOSIS — I472 Ventricular tachycardia, unspecified: Secondary | ICD-10-CM | POA: Insufficient documentation

## 2010-12-08 DIAGNOSIS — I2510 Atherosclerotic heart disease of native coronary artery without angina pectoris: Secondary | ICD-10-CM | POA: Insufficient documentation

## 2010-12-08 DIAGNOSIS — I4729 Other ventricular tachycardia: Secondary | ICD-10-CM | POA: Insufficient documentation

## 2010-12-08 DIAGNOSIS — I501 Left ventricular failure: Secondary | ICD-10-CM | POA: Insufficient documentation

## 2010-12-08 DIAGNOSIS — R092 Respiratory arrest: Secondary | ICD-10-CM | POA: Insufficient documentation

## 2010-12-08 DIAGNOSIS — E785 Hyperlipidemia, unspecified: Secondary | ICD-10-CM | POA: Insufficient documentation

## 2010-12-08 DIAGNOSIS — Z8674 Personal history of sudden cardiac arrest: Secondary | ICD-10-CM | POA: Insufficient documentation

## 2010-12-08 DIAGNOSIS — I252 Old myocardial infarction: Secondary | ICD-10-CM | POA: Insufficient documentation

## 2010-12-08 DIAGNOSIS — Z9861 Coronary angioplasty status: Secondary | ICD-10-CM | POA: Insufficient documentation

## 2010-12-08 DIAGNOSIS — D509 Iron deficiency anemia, unspecified: Secondary | ICD-10-CM | POA: Insufficient documentation

## 2010-12-08 DIAGNOSIS — I4891 Unspecified atrial fibrillation: Secondary | ICD-10-CM | POA: Insufficient documentation

## 2010-12-08 DIAGNOSIS — Z9889 Other specified postprocedural states: Secondary | ICD-10-CM | POA: Insufficient documentation

## 2010-12-08 DIAGNOSIS — Z7982 Long term (current) use of aspirin: Secondary | ICD-10-CM | POA: Insufficient documentation

## 2010-12-08 DIAGNOSIS — I2589 Other forms of chronic ischemic heart disease: Secondary | ICD-10-CM | POA: Insufficient documentation

## 2010-12-11 ENCOUNTER — Encounter (HOSPITAL_COMMUNITY): Payer: Self-pay

## 2010-12-13 ENCOUNTER — Encounter (HOSPITAL_COMMUNITY): Payer: Self-pay

## 2010-12-15 ENCOUNTER — Encounter (HOSPITAL_COMMUNITY): Payer: Self-pay

## 2010-12-18 ENCOUNTER — Encounter (HOSPITAL_COMMUNITY): Payer: Self-pay

## 2010-12-20 ENCOUNTER — Encounter (HOSPITAL_COMMUNITY): Payer: Self-pay

## 2010-12-22 ENCOUNTER — Encounter (HOSPITAL_COMMUNITY): Payer: Self-pay

## 2010-12-25 ENCOUNTER — Encounter (HOSPITAL_COMMUNITY): Payer: Self-pay

## 2010-12-25 ENCOUNTER — Encounter (INDEPENDENT_AMBULATORY_CARE_PROVIDER_SITE_OTHER): Payer: Medicare Other

## 2010-12-25 DIAGNOSIS — Z7901 Long term (current) use of anticoagulants: Secondary | ICD-10-CM

## 2010-12-25 DIAGNOSIS — I4891 Unspecified atrial fibrillation: Secondary | ICD-10-CM

## 2010-12-27 ENCOUNTER — Encounter (HOSPITAL_COMMUNITY): Payer: Self-pay

## 2010-12-29 ENCOUNTER — Encounter (HOSPITAL_COMMUNITY): Payer: Self-pay

## 2011-01-01 ENCOUNTER — Encounter (HOSPITAL_COMMUNITY): Payer: Self-pay

## 2011-01-03 ENCOUNTER — Encounter (HOSPITAL_COMMUNITY): Payer: Self-pay

## 2011-01-05 ENCOUNTER — Encounter (HOSPITAL_COMMUNITY): Payer: Self-pay

## 2011-01-08 ENCOUNTER — Encounter (HOSPITAL_COMMUNITY): Payer: Self-pay | Attending: Cardiology

## 2011-01-08 DIAGNOSIS — I447 Left bundle-branch block, unspecified: Secondary | ICD-10-CM | POA: Insufficient documentation

## 2011-01-08 DIAGNOSIS — Z9889 Other specified postprocedural states: Secondary | ICD-10-CM | POA: Insufficient documentation

## 2011-01-08 DIAGNOSIS — Z8674 Personal history of sudden cardiac arrest: Secondary | ICD-10-CM | POA: Insufficient documentation

## 2011-01-08 DIAGNOSIS — I252 Old myocardial infarction: Secondary | ICD-10-CM | POA: Insufficient documentation

## 2011-01-08 DIAGNOSIS — I2589 Other forms of chronic ischemic heart disease: Secondary | ICD-10-CM | POA: Insufficient documentation

## 2011-01-08 DIAGNOSIS — R092 Respiratory arrest: Secondary | ICD-10-CM | POA: Insufficient documentation

## 2011-01-08 DIAGNOSIS — Z7982 Long term (current) use of aspirin: Secondary | ICD-10-CM | POA: Insufficient documentation

## 2011-01-08 DIAGNOSIS — F3289 Other specified depressive episodes: Secondary | ICD-10-CM | POA: Insufficient documentation

## 2011-01-08 DIAGNOSIS — Z950 Presence of cardiac pacemaker: Secondary | ICD-10-CM | POA: Insufficient documentation

## 2011-01-08 DIAGNOSIS — I472 Ventricular tachycardia, unspecified: Secondary | ICD-10-CM | POA: Insufficient documentation

## 2011-01-08 DIAGNOSIS — Z951 Presence of aortocoronary bypass graft: Secondary | ICD-10-CM | POA: Insufficient documentation

## 2011-01-08 DIAGNOSIS — I059 Rheumatic mitral valve disease, unspecified: Secondary | ICD-10-CM | POA: Insufficient documentation

## 2011-01-08 DIAGNOSIS — I509 Heart failure, unspecified: Secondary | ICD-10-CM | POA: Insufficient documentation

## 2011-01-08 DIAGNOSIS — I501 Left ventricular failure: Secondary | ICD-10-CM | POA: Insufficient documentation

## 2011-01-08 DIAGNOSIS — Z7901 Long term (current) use of anticoagulants: Secondary | ICD-10-CM | POA: Insufficient documentation

## 2011-01-08 DIAGNOSIS — D890 Polyclonal hypergammaglobulinemia: Secondary | ICD-10-CM | POA: Insufficient documentation

## 2011-01-08 DIAGNOSIS — Z5189 Encounter for other specified aftercare: Secondary | ICD-10-CM | POA: Insufficient documentation

## 2011-01-08 DIAGNOSIS — I2510 Atherosclerotic heart disease of native coronary artery without angina pectoris: Secondary | ICD-10-CM | POA: Insufficient documentation

## 2011-01-08 DIAGNOSIS — Z9861 Coronary angioplasty status: Secondary | ICD-10-CM | POA: Insufficient documentation

## 2011-01-08 DIAGNOSIS — I959 Hypotension, unspecified: Secondary | ICD-10-CM | POA: Insufficient documentation

## 2011-01-08 DIAGNOSIS — E785 Hyperlipidemia, unspecified: Secondary | ICD-10-CM | POA: Insufficient documentation

## 2011-01-08 DIAGNOSIS — I4891 Unspecified atrial fibrillation: Secondary | ICD-10-CM | POA: Insufficient documentation

## 2011-01-08 DIAGNOSIS — I4729 Other ventricular tachycardia: Secondary | ICD-10-CM | POA: Insufficient documentation

## 2011-01-08 DIAGNOSIS — I2789 Other specified pulmonary heart diseases: Secondary | ICD-10-CM | POA: Insufficient documentation

## 2011-01-08 DIAGNOSIS — D509 Iron deficiency anemia, unspecified: Secondary | ICD-10-CM | POA: Insufficient documentation

## 2011-01-08 DIAGNOSIS — F329 Major depressive disorder, single episode, unspecified: Secondary | ICD-10-CM | POA: Insufficient documentation

## 2011-01-10 ENCOUNTER — Encounter (HOSPITAL_COMMUNITY): Payer: Self-pay

## 2011-01-12 ENCOUNTER — Encounter (HOSPITAL_COMMUNITY): Payer: Self-pay

## 2011-01-15 ENCOUNTER — Encounter (HOSPITAL_COMMUNITY): Payer: Self-pay

## 2011-01-17 ENCOUNTER — Encounter (HOSPITAL_COMMUNITY): Payer: Self-pay

## 2011-01-19 ENCOUNTER — Encounter (HOSPITAL_COMMUNITY): Payer: Self-pay

## 2011-01-22 ENCOUNTER — Ambulatory Visit (INDEPENDENT_AMBULATORY_CARE_PROVIDER_SITE_OTHER): Payer: Medicare Other | Admitting: *Deleted

## 2011-01-22 ENCOUNTER — Encounter (HOSPITAL_COMMUNITY): Payer: Self-pay

## 2011-01-22 DIAGNOSIS — Z7901 Long term (current) use of anticoagulants: Secondary | ICD-10-CM

## 2011-01-22 DIAGNOSIS — I4891 Unspecified atrial fibrillation: Secondary | ICD-10-CM

## 2011-01-24 ENCOUNTER — Encounter (HOSPITAL_COMMUNITY): Payer: Self-pay

## 2011-01-26 ENCOUNTER — Encounter (HOSPITAL_COMMUNITY): Payer: Self-pay

## 2011-01-26 ENCOUNTER — Other Ambulatory Visit: Payer: Self-pay | Admitting: Cardiology

## 2011-01-26 DIAGNOSIS — I4891 Unspecified atrial fibrillation: Secondary | ICD-10-CM

## 2011-01-26 DIAGNOSIS — I509 Heart failure, unspecified: Secondary | ICD-10-CM

## 2011-01-26 MED ORDER — DIGOXIN 125 MCG PO TABS: 125.0000 ug | ORAL_TABLET | Freq: Every day | ORAL | Status: DC

## 2011-01-26 MED ORDER — SPIRONOLACTONE 25 MG PO TABS: 25.0000 mg | ORAL_TABLET | Freq: Every day | ORAL | Status: DC

## 2011-01-26 NOTE — Telephone Encounter (Signed)
Digoxin 125 mg and Spironolactone 25 mg have not been called in yet. She requested them on this Wednesday.   [facility]

## 2011-01-26 NOTE — Telephone Encounter (Signed)
Called stating pharm has sent [place] refill 2 x on digoxin and spironolactone. sent

## 2011-01-29 ENCOUNTER — Encounter (HOSPITAL_COMMUNITY): Payer: Self-pay

## 2011-01-31 ENCOUNTER — Encounter (HOSPITAL_COMMUNITY): Payer: Self-pay

## 2011-02-02 ENCOUNTER — Encounter (HOSPITAL_COMMUNITY): Payer: Self-pay

## 2011-02-05 ENCOUNTER — Encounter (HOSPITAL_COMMUNITY): Payer: Self-pay

## 2011-02-07 ENCOUNTER — Encounter (HOSPITAL_COMMUNITY): Payer: Self-pay | Attending: Cardiology

## 2011-02-07 DIAGNOSIS — Z9889 Other specified postprocedural states: Secondary | ICD-10-CM | POA: Insufficient documentation

## 2011-02-07 DIAGNOSIS — I472 Ventricular tachycardia, unspecified: Secondary | ICD-10-CM | POA: Insufficient documentation

## 2011-02-07 DIAGNOSIS — Z7982 Long term (current) use of aspirin: Secondary | ICD-10-CM | POA: Insufficient documentation

## 2011-02-07 DIAGNOSIS — F3289 Other specified depressive episodes: Secondary | ICD-10-CM | POA: Insufficient documentation

## 2011-02-07 DIAGNOSIS — Z7901 Long term (current) use of anticoagulants: Secondary | ICD-10-CM | POA: Insufficient documentation

## 2011-02-07 DIAGNOSIS — Z5189 Encounter for other specified aftercare: Secondary | ICD-10-CM | POA: Insufficient documentation

## 2011-02-07 DIAGNOSIS — Z951 Presence of aortocoronary bypass graft: Secondary | ICD-10-CM | POA: Insufficient documentation

## 2011-02-07 DIAGNOSIS — D890 Polyclonal hypergammaglobulinemia: Secondary | ICD-10-CM | POA: Insufficient documentation

## 2011-02-07 DIAGNOSIS — I4729 Other ventricular tachycardia: Secondary | ICD-10-CM | POA: Insufficient documentation

## 2011-02-07 DIAGNOSIS — I959 Hypotension, unspecified: Secondary | ICD-10-CM | POA: Insufficient documentation

## 2011-02-07 DIAGNOSIS — F329 Major depressive disorder, single episode, unspecified: Secondary | ICD-10-CM | POA: Insufficient documentation

## 2011-02-07 DIAGNOSIS — I059 Rheumatic mitral valve disease, unspecified: Secondary | ICD-10-CM | POA: Insufficient documentation

## 2011-02-07 DIAGNOSIS — I501 Left ventricular failure: Secondary | ICD-10-CM | POA: Insufficient documentation

## 2011-02-07 DIAGNOSIS — I4891 Unspecified atrial fibrillation: Secondary | ICD-10-CM | POA: Insufficient documentation

## 2011-02-07 DIAGNOSIS — R092 Respiratory arrest: Secondary | ICD-10-CM | POA: Insufficient documentation

## 2011-02-07 DIAGNOSIS — I509 Heart failure, unspecified: Secondary | ICD-10-CM | POA: Insufficient documentation

## 2011-02-07 DIAGNOSIS — D509 Iron deficiency anemia, unspecified: Secondary | ICD-10-CM | POA: Insufficient documentation

## 2011-02-07 DIAGNOSIS — Z950 Presence of cardiac pacemaker: Secondary | ICD-10-CM | POA: Insufficient documentation

## 2011-02-07 DIAGNOSIS — I2789 Other specified pulmonary heart diseases: Secondary | ICD-10-CM | POA: Insufficient documentation

## 2011-02-07 DIAGNOSIS — I2510 Atherosclerotic heart disease of native coronary artery without angina pectoris: Secondary | ICD-10-CM | POA: Insufficient documentation

## 2011-02-07 DIAGNOSIS — I447 Left bundle-branch block, unspecified: Secondary | ICD-10-CM | POA: Insufficient documentation

## 2011-02-07 DIAGNOSIS — I2589 Other forms of chronic ischemic heart disease: Secondary | ICD-10-CM | POA: Insufficient documentation

## 2011-02-07 DIAGNOSIS — Z9861 Coronary angioplasty status: Secondary | ICD-10-CM | POA: Insufficient documentation

## 2011-02-07 DIAGNOSIS — E785 Hyperlipidemia, unspecified: Secondary | ICD-10-CM | POA: Insufficient documentation

## 2011-02-07 DIAGNOSIS — I252 Old myocardial infarction: Secondary | ICD-10-CM | POA: Insufficient documentation

## 2011-02-07 DIAGNOSIS — Z8674 Personal history of sudden cardiac arrest: Secondary | ICD-10-CM | POA: Insufficient documentation

## 2011-02-09 ENCOUNTER — Encounter (HOSPITAL_COMMUNITY): Payer: Self-pay

## 2011-02-12 ENCOUNTER — Encounter (HOSPITAL_COMMUNITY): Payer: Self-pay

## 2011-02-14 ENCOUNTER — Encounter (HOSPITAL_COMMUNITY): Payer: Self-pay

## 2011-02-16 ENCOUNTER — Encounter (HOSPITAL_COMMUNITY): Payer: Self-pay

## 2011-02-19 ENCOUNTER — Encounter (HOSPITAL_COMMUNITY): Payer: Self-pay

## 2011-02-20 NOTE — Cardiovascular Report (Signed)
NAMEREXANN, LUERAS NO.:  [id number]   MEDICAL RECORD NO.:  [id number]          PATIENT TYPE:  INP   LOCATION:  2905                         FACILITY:  [facility]   PHYSICIAN:  Georga Hacking, M.D.DATE OF BIRTH:  January 13, 1930   DATE OF PROCEDURE:  11/29/2007  DATE OF DISCHARGE:                            CARDIAC CATHETERIZATION   HISTORY:  A 77 year old female who was recently discharged following a  CHF exacerbation and pneumonia.  She had increased digoxin and diltiazem  during that admission and was discharged on November 20, 2007. She  developed a gastritis, nausea, anorexia and diarrhea. She was weak,  lightheaded and collapsed in the bathroom and was found to have a pulse  rate in the 20s and 30s was transcutaneously paced. She was hypotensive  initially.   PROCEDURE:  Insertion of temporary transvenous pacemaker via the right  femoral vein.   DESCRIPTION OF PROCEDURE:  The patient was brought to the  catheterization lab, and the right groin was prepped and draped. An INR  was previously 4, and she had been given an infusion of fresh frozen  plasma. The right femoral vein was entered using a single anterior  needle wall stick.  A 6-French sheath was placed in this vein.  A  temporary pacemaker was advanced to the right ventricle and, after the  positioning several times, was finally able to obtain a good capture.  She was paced at a rate of 80 with a threshold of less than 0.6. She was  returned to the CCU in stable condition.   IMPRESSION:  Successful placement of temporary transvenous pacemaker  through the right femoral vein.      Georga Hacking, M.D.  Electronically Signed     WST/MEDQ  D:  11/29/2007  T:  11/30/2007  Job:  [id number]   cc:   Peter M. [place], M.D.

## 2011-02-20 NOTE — Cardiovascular Report (Signed)
[name], Kim NO.:  [id number]   MEDICAL RECORD NO.:  [id number]          PATIENT TYPE:  INP   LOCATION:  2807                         FACILITY:  [facility]   PHYSICIAN:  Peter M. [place], M.D.  DATE OF BIRTH:  07-Aug-1930   DATE OF PROCEDURE:  03/19/2007  DATE OF DISCHARGE:                            CARDIAC CATHETERIZATION   INDICATIONS FOR PROCEDURE:  A 77 year old white female presented with  acute pulmonary edema and a non-Q-wave myocardial infarction.  She also  had atrial fibrillation with rapid ventricular response.  She has  subsequently recovered and is back in sinus rhythm.  She has remote  history of stenting of the distal right coronary with a [id number] stent in  1996.   PROCEDURE:  Left heart catheterization, coronary and left ventricular  angiography and intracoronary stenting of the right coronary artery.  Access via the right femoral artery using standard Seldinger technique.  Closure was obtained with Angio-Seal device with excellent hemostasis.  Equipment, 6-French 4 cm right and left Judkins catheter, 6-French  pigtail catheter, 6-French arterial sheath, a 6-French JR-4 guide with  side holes, 0.04 high-torque floppy extra support wire, a 3.0 x 15 mm  Maverick balloon, a 3.0 x 15-mm Quantum Maverick balloon, a  3.5 x 20-mm  Quantum Maverick balloon, a 3.0 x 24 mm Taxus stent and a 3.0 x 28 mm  Taxus stent.   MEDICATIONS:  Local anesthesia with 1% Xylocaine, Versed total of 3 mg  IV, fentanyl 25 mcg IV, nitroglycerin 200 mcg intracoronary x2,  Integrilin double bolus at 180 mcg/kg followed by continuous infusion of  2 mcg/kg per minute, Plavix 600 mg p.o. ACT after heparin was 264,  contrast 275 mL of Omnipaque.   HEMODYNAMIC DATA:  Aortic pressure is 160/55 with a mean of 104, left  ventricle pressure is 158 with EDP 11 mmHg.   ANGIOGRAPHIC DATA:  The left coronary artery arises and distributes  normally.  The left main coronary artery is  moderately calcified without  significant disease.   The left anterior descending artery is moderately calcified in the  proximal and mid segments.  There is 40-50% narrowing in the mid-LAD.  The distal LAD has mild irregularities.  There are two moderate size  diagonal branches which are without significant disease.   The left circumflex coronary is a small vessel and appears normal.   The right coronary is a very large dominant vessel.  It gives rise to  large PDA and two large posterolateral branches.  The right coronary is  diffusely diseased.  There is segmental 80-90% stenosis in the mid  vessel with moderate calcification.  In the distal vessel there is a 90%  stenosis which is also calcified.  The previously stented segment of the  distal vessel is still widely patent.   Left ventricular angiography was performed in RAO view.  This  demonstrates normal left ventricular size.  There is moderate inferior  wall hypokinesia with overall mild to moderate left ventricular  dysfunction.  Ejection fraction is estimated at 45%.  There is 2+ mitral  insufficiency.   We proceeded  at this point with intervention of the right coronary  stenoses.  Using above equipment, the patient was anticoagulated.  We  were able to cross lesions without difficulty using a wire.  We  predilated both the mid and distal vessel lesions using a 3.0 x 50 mm  Maverick balloon.  The distal lesion was very hard to yield, even up to  12 atmospheres.  The mid vessel lesion yielded easily at 6 atmospheres.  We then used a 3.0 x 50 mm Quantum Maverick balloon to dilate the more  distal lesion and this actually yielded quite easily with a 6  atmospheres inflation.   We then proceeded to stent the distal lesion using a 3.0 x 24 mm Taxus  stent.  This was overlapped distally with the old stent.  It was  deployed at 9 atmospheres and then 12 atmospheres with the stent  balloon.  We next postdilated using a 3.0 x 50  mm Quantum Maverick  balloon up to 16 atmospheres throughout the stented segment.  We next  addressed the midvessel lesion using a 3.0 x 24 mm Taxus stent.  This  was placed an overlapping fashion with the first stent and extended more  proximally, covering the disease segment.  This was deployed at 9  atmospheres and then 12 atmospheres with a stent balloon.  We then  exchanged for a 3.5 x 20 mm Quantum Maverick balloon.  We inflated this  in the transition zone and overlapped the two stents up to 10  atmospheres.  More proximally we did two inflations up to 14 atmospheres  to expand the stent.  An excellent angiographic result was obtained with  0% residual stenosis and TIMI grade 3 flow.  The patient tolerated  procedure very well.   FINAL INTERPRETATION:  1. Single-vessel obstructive atherosclerotic coronary disease.  2. Mild to moderate left ventricular dysfunction.  3. Mitral insufficiency 2+.  4. Successful intracoronary stenting of the mid and distal right      coronary artery.           ______________________________  Peter M. [place], M.D.     PMJ/MEDQ  D:  03/19/2007  T:  03/19/2007  Job:  [id number]   cc:   C. Duane Lope, M.D.

## 2011-02-20 NOTE — Consult Note (Signed)
[name], [name]             ACCOUNT NO.:  [id number]   MEDICAL RECORD NO.:  [id number]          PATIENT TYPE:  INP   LOCATION:  2111                         FACILITY:  [facility]   PHYSICIAN:  Wilson Singer, M.D.DATE OF BIRTH:  07/14/1930   DATE OF CONSULTATION:  05/03/2008  DATE OF DISCHARGE:                                 CONSULTATION   REFERRING PHYSICIAN:  Charlcie Cradle. Delford Field, MD, FCCP.   CONSULTING PHYSICIAN:  Wilson Singer, M.D.   IMPRESSION:  1. Dyspnea - secondary to flash pulmonary edema, secondary to sudden      atrial fibrillation in the setting of systolic dysfunction.  The      patient is currently requiring pressors.  The patient's quality of      life in between these episodes of pulmonary edema is actually very      good.  2. Altered mental status - apparently there was poor response few      hours ago but now the patient is much more alert and obeying      commands.  3. Palliative performance score of 80%-90% prehospitalization.   RECOMMENDATIONS:  1. Continue aggressive care.  2. Would appreciate Dr. Elvis Coil input as to whether the patient needs      ablation therapy for her atrial fibrillation.  3. Reevaluate goals if the patient remains on pressors and is      dependent on pressors and/or dependent on mechanical ventilation      and/or if conscious level deteriorates.   The family agree with this plan of care.   HISTORY:  This 77 year old lady has now been admitted for about a fourth  or fifth time this year with flash pulmonary edema which developed very  rapidly, and Dr. Swaziland knows her very well.  She does have systolic  dysfunction with ejection fraction of 40%-45%, but her main issue seems  to be that she goes into pulmonary edema as soon as she goes into atrial  fibrillation.  This happened again this morning at about 4 a.m. when she  became short of breath which progressed rapidly.  Now she is on a  mechanical ventilator and also on  pressor support.  She was a few hours  ago not very alert but now is fairly alert, obeying commands.  We are  asked to consult on our goals of care.  The husband and the son tell me  that between these episodes her quality of life is very good.  She is  able to carry out all activities of daily living which is full  aggressive care.   PAST MEDICAL HISTORY:  Significant for;  1. Ischemic cardiomyopathy with baseline ejection fraction of 45%-50%      with inferior wall hypokinesia.  2. Coronary artery disease with stenting in the right coronary artery      in 1996 and again in 2008 with a drug-eluting stent.  3. History of recurrent paroxysmal atrial fibrillation.  4. Hyperlipidemia.  5. Hypertension.  6. Iron deficiency anemia.  7. Previous history of __________  8. Previous history of amiodarone __________.  9. History of  left bundle-branch block.  10.Previous history of removal of colon and cecal polyps.   ALLERGIES OR INTOLERANCES:  AMIODARONE and DIGOXIN.   SOCIAL HISTORY:  She is married and lives with her husband.  She has 3  children.  She does not smoke, does not drink alcohol.   FAMILY HISTORY:  Noncontributory.   REVIEW OF SYSTEMS:  The patient currently unable to giving any history  at the present time as she is on a mechanical ventilator.  She is alert  however, but I did not pursue any further questioning at this point.   CURRENT MEDICATIONS:  1. Zosyn intravenously.  2. Coumadin, when the patient is able to take p.o.  3. Crestor 20 mg daily.  4. Aspirin 81 mg daily.  5. Metoprolol 25 mg b.i.d.  6. Lasix which has been discontinued.  7. Protonix 40 mg IV q.24 h.  8. Tikosyn 250 mg b.i.d.   PHYSICAL EXAMINATION:  GENERAL:  She is afebrile.  VITAL SIGNS:  Blood pressure 123/81, heart rate 80, saturations 100% on  ventilator.  __________ maintained blood pressure.  She has had 450 mL  of urine so far.   RELEVANT DATA:  Sodium of 139, potassium 4.1, bicarbonate  18, glucose  243, BUN 19, creatinine 0.94, and calcium 9.5.  Lactic acid is  significantly elevated at 5.1.  Troponin is within normal range.  Hemoglobin 17.4, white blood cells 14.5, platelets 248.  INR was 1.7.   FURTHER DISCUSSION:  I believe that this lady has a __________ but her  quality of life was very good.  Echocardiogram has been done and Dr.  [place] will review this as well as review with the patient.  Based on  further findings, goals of care may be changed, but at the present time  I think aggressive care is appropriate assessing from her excellent  quality of life.   Time spent was 60 minutes, more than 50% of which was involved in  counseling and coordination of care.      Wilson Singer, M.D.  Electronically Signed     NCG/MEDQ  D:  05/03/2008  T:  05/04/2008  Job:  [id number]

## 2011-02-20 NOTE — H&P (Signed)
[name], [name] NO.:  [id number]   MEDICAL RECORD NO.:  [id number]          PATIENT TYPE:  INP   LOCATION:  3316                         FACILITY:  [facility]   PHYSICIAN:  Peter M. Swaziland, M.D.  DATE OF BIRTH:  1930/02/01   DATE OF ADMISSION:  03/08/2008  DATE OF DISCHARGE:                              HISTORY & PHYSICAL   HISTORY OF PRESENT ILLNESS:  [name] is a 77 year old white female  with known history of congestive heart failure, coronary artery disease,  and recurrent atrial fibrillation.  She presents this morning after  acute onset of severe shortness of breath beginning 5:30 a.m.  She  denied any significant cough or fever.  She has had no increased edema  or weight gain.  However, shortness of breath is severe.  She presented  to the emergency room.  She was found to be severely hypertensive.  She  was in sinus rhythm on her own presentation.  Chest x-ray was consistent  with acute pulmonary edema.  She is now admitted for treatment of  recurrent congestive heart failure.  [name] has had repeated  hospitalizations for congestive heart failure.  Her last echocardiogram  in February of this year showed ejection fraction of 45-50%.  She has  mitral valve prolapse with history of mitral insufficiency.  She has had  recurrent atrial fibrillation.  In the past, she has failed therapy with  Betapace.  She was last treated with amiodarone, but developed severe  pulmonary toxicity and this was discontinued in February of this year.  Since then she has been managed with rate control only.  She also has a  history of dig toxicity.  She has a history of coronary artery disease  and is status post stenting of the proximal to distal right coronary  artery in June 2008 with Taxus drug-eluting stents.  She has been  treated chronically with aspirin and Coumadin.   PAST MEDICAL HISTORY:  1. History of ischemic cardiomyopathy with baseline ejection  fraction      of 45-50% with inferior wall akinesia.  2. Coronary artery disease with stenting in the right coronary artery      in 1996 and again in 2008 with a drug-eluting stent.  3. History of recurrent paroxysmal atrial fibrillation.  4. Hyperlipidemia.  5. Hypertension.  6. Iron deficiency anemia.  7. History of dig toxicity.  8. History of amiodarone pulmonary toxicity.  9. Previous removal of colon and cecal polyps.  10.Left bundle branch block.   CURRENT MEDICATIONS:  Include Coumadin 3 mg alternating with 1.5 mg  daily, Crestor 20 mg per day, potassium 20 mEq per day, aspirin 81 mg  per day, Lasix 40 mg daily, iron 325 mg b.i.d., Protonix 40 mg daily,  metoprolol XL 25 mg daily.   ALLERGIES:  The patient is intolerant to amiodarone and to digoxin.   SOCIAL HISTORY:  The patient lives in [place] with her husband.  She  has children.  She denies tobacco or alcohol use.   FAMILY HISTORY:  Positive for coronary artery disease in her father.  History of cancer  in her mother.   REVIEW OF SYSTEMS:  Otherwise unremarkable.  She has had no nausea,  vomiting, diarrhea, TIA, or CVA symptoms.  She has had no claudication.  No increased edema.  She has had some tightness in her lower chest  associated with her shortness of breath this morning.   PHYSICAL EXAM:  VITAL SIGNS:  When patient initially presented to the  emergency room, she was severely hypertensive with a blood pressure of  190/111.  This subsequently came down to 164/80, her pulse was 67 and  regular, respirations were increased at 30 and moderately labored.  Oxygen saturation initially was 83% and increased to 94% on non-  rebreather mask.  HEENT:  Normocephalic, atraumatic.  EYES:  Her pupils are equal, round and reactive to light and  accommodation.  Her sclerae clear.  MOUTH:  Oropharynx is clear.  NECK:  Without bruits or adenopathy.  She does have mild jugular venous  distention.  LUNGS:  Reveal  bilateral coarse rales.  CARDIAC EXAM:  Reveals a regular rate and rhythm with a grade 2/6  systolic murmur at the apex.  ABDOMEN:  Soft, nontender, without masses or hepatosplenomegaly.  EXTREMITIES:  Femoral and pedal pulses were 2+ and symmetric.  She has  no lower extremity edema.  NEUROLOGIC EXAM:  Nonfocal.   LABORATORY DATA:  Sodium is 139, potassium 4.3, chloride 105, CO2 25,  glucose 206, BUN 14, creatinine 0.81, calcium is 9.2.  Liver function  studies are normal.  Hemoglobin 15.2, hematocrit 45.9, white count  14,100, platelets 243,000.  Pro time is 26 with an INR of 2.3, BNP level  is 918.  ECG shows normal sinus rhythm with a left bundle branch block.  Chest x-ray shows mild cardiomegaly with pulmonary edema.   IMPRESSION:  1. Recurrent acute pulmonary edema secondary to acute on chronic      diastolic and systolic dysfunction.  2. Mitral valve prolapse, chronic mitral insufficiency.  3. Recurrent atrial fibrillation.  4. Coronary artery disease with prior stenting of the right coronary.  5. Hypertension.  6. History of iron deficiency anemia.  7. History of previous removal of a large cecal polyp.  8. History of amiodarone and digoxin toxicity.   PLAN:  Will admit the patient to step-down unit.  She will be treated  with oxygen support.  Will continue IV nitroglycerin until cardiac  enzymes return, she is ruled out to stop the IV nitroglycerin.  She will  be diuresed with IV Lasix.  The patient has recurrent atrial  fibrillation during this hospitalization, I would recommend initiation  of antiarrhythmic drug therapy with Tikosyn.  She is greater than 3  months out now from her Amiodarone therapy and I think her intermittent  atrial fibrillation may be contributing to her recurrent heart failure.  Will continue on Coumadin therapy.  Also resume ACE inhibitor.  The  patient had taken Diovan in the past.  It is unclear why this was  stopped.  There is no documented  adverse reaction.           ______________________________  Peter M. Sw[place]iland, M.D.     PMJ/MEDQ  D:  03/08/2008  T:  03/09/2008  Job:  [id number]   cc:   C. Duane Lope, M.D.  Bernette Redbird, M.D.

## 2011-02-20 NOTE — H&P (Signed)
[name], [name]             ACCOUNT NO.:  [id number]   MEDICAL RECORD NO.:  [id number]          PATIENT TYPE:  INP   LOCATION:  2621                         FACILITY:  [facility]   PHYSICIAN:  Corinna L. Lendell Caprice, MDDATE OF BIRTH:  26-Sep-1930   DATE OF ADMISSION:  11/16/2007  DATE OF DISCHARGE:                              HISTORY & PHYSICAL   CHIEF COMPLAINT:  Shortness of breath.   HISTORY OF PRESENT ILLNESS:  [name] is a 77 year old white female  with a history of congestive heart failure, atrial fibrillation and  respiratory failure in the past requiring BiPAP, who presents with  sudden onset of shortness of breath this morning.  Apparently, according  to the ER physician, her oxygen saturations were in the 50s at home and,  on BiPAP, her sats now are 100%.  She recently had a sinus infection and  had antibiotic for that.  She does not know which antibiotic.  Her  husband reports that she has been feeling malaise and fatigued over the  past week and thought she may have a cold but really can describe no  other symptoms.  Her two-pillow orthopnea has not changed.  She has had  no change in her medication other than the antibiotic and change in her  Coumadin.  She has not run out of any of her medications and reports  compliance with a low-salt diet.  Her appetite has been good.  She was  working in the garden yesterday but felt tired and it is difficult to  get much more history as the patient is currently on BiPAP.   PAST MEDICAL HISTORY:  1. Significant for coronary artery disease with history of myocardial      infarction and a right coronary stent in 1996 and in 2008.  2. Ejection fraction 40-45%, ischemic cardiomyopathy.  3. Hyperlipidemia.  4. Hypertension.  5. Atrial fibrillation.  Denies history of diabetes.   MEDICATIONS:  1. Crestor 10 mg a day.  2. Lanoxin 0.125 mg a day.  3. Diltiazem 120 mg a day.  4. Diovan 320 mg a day.  5. Lasix 40 mg a day.  6.  Amiodarone 200 mg a day.  7. Coumadin 3 mg alternating with 1.5 mg a day.  8. Plavix 75 mg a day.   She has no known drug allergies.   SOCIAL HISTORY:  She is here with her husband who provides much of the  history.  There is no drinking or smoking history.   FAMILY HISTORY:  Mother had cancer.  Father had coronary disease.   REVIEW OF SYSTEMS:  Is difficult due to the BiPAP but, otherwise, as  above.   PHYSICAL EXAMINATION:  Her temperature was 99.6 rectally, blood pressure  156/59, pulse initially 124, currently in the 60s, respiratory rate 28,  oxygen saturation 100% on BiPAP, apparently she was on CPAP on route to  the hospital and her saturations initially were 93%.  IN GENERAL:  The patient has her eyes closed but is oriented and can  provide some history.  HEENT:  Pupils equal, round, reactive to light.  Dry mucous  membranes.  NECK:  Is supple.  No carotid bruits.  No JVD.  LUNGS:  Are clear to auscultation anteriorly without wheezes, rhonchi or  rales.  CARDIOVASCULAR:  Irregularly irregular.  Difficult to assess  murmurs due to the BiPAP.  ABDOMEN:  Is soft, nontender, normal bowel sounds.  GU:  She has a Foley catheter draining clear urine.  RECTAL:  Deferred.  EXTREMITIES:  No clubbing, cyanosis or edema.  Feet are warm.  Pulses  are intact.  NEUROLOGIC:  Eyes are closed but she is oriented.  Sensory/motor exams are intact.  SKIN:  No rash.  PSYCHIATRIC:  The patient is cooperative.   LABS:  Her white blood cell count is 15,000 with 85% neutrophils, 10%  lymphocytes.  Hemoglobin 11.2, hematocrit 33, MCV is 76, platelet count  448,000.  INR is 1.8.  Basic metabolic panel significant for a glucose  of 311, bicarbonate of 19, otherwise unremarkable.  Myoglobin 203,  normal CPK-MB and troponin.  BNP is 357 and, in the past, the highest  BNP was 951 in June.  Digoxin level is pending.  EKG is pending but, on  telemetry, she shows atrial fibrillation.  Portable chest  x-ray shows  interval development of moderate, diffuse, interstitial edema or  infiltrates with air space opacities in the perihilar regions, right  greater than left.   ASSESSMENT/PLAN:  1. Acute respiratory failure:  With leukocytosis, certainly pneumonia      is possible.  She has recently been on antibiotics and has been in      the hospital twice in the past year, making resistant organisms or      Pseudomonas possible.  For now, she will get intravenous vancomycin      and Zosyn.  I will check blood cultures and repeat chest x-ray in      the morning.  Also, there may be an element of congestive heart      failure despite equivocal BNP.  It looks as if she has had      respiratory failure several times in the past which was thought to      be at least a component of congestive heart failure and not      terribly high BNPs.  She has received a dose of intravenous Lasix      with good results and I will give 40 mg intravenous b.i.d., rule      out myocardial infarction.  She had a TSH checked 6 months ago      which was normal.  2. Atrial fibrillation with slightly subtherapeutic INR.  Continue      Coumadin and monitor daily.  3. Coronary artery disease with history of MI and stent, no chest      pain.  4. Hypertension.  Continue outpatient medications.  5. Hyperlipidemia.  Continue Crestor.  6. Ischemic cardiomyopathy with ejection fraction of 40-45%.  7. Hyperglycemia.  The patient has no history of diabetes.  I will      check a hemoglobin A1c and give sliding scale for now.      Corinna L. Lendell Caprice, MD  Electronically Signed     CLS/MEDQ  D:  11/16/2007  T:  11/17/2007  Job:  [id number]   cc:   Sigmund Hazel, M.D.  Peter M. [place]il[place], M.D.

## 2011-02-20 NOTE — Discharge Summary (Signed)
[name], [name] NO.:  [id number]   MEDICAL RECORD NO.:  [id number]          PATIENT TYPE:  INP   LOCATION:  6525                         FACILITY:  [facility]   PHYSICIAN:  Peter M. Sw[place]iland, M.D.  DATE OF BIRTH:  09-Dec-1929   DATE OF ADMISSION:  03/14/2007  DATE OF DISCHARGE:  03/21/2007                               DISCHARGE SUMMARY   HISTORY OF PRESENT ILLNESS:  [name] is a 77 year old white female  who has a prior history of atrial fibrillation that was regulated with  Coumadin and Betapace.  She has a remote history of coronary artery  disease, having inferior myocardial infarction in 1996 with stenting of  the distal right coronary using  a [id number] stent.  She has a history of  hypertension.  The patient presented the morning of admission with acute  respiratory distress.  She woke suddenly from sleep at 3:00 a.m., with  inability to breathe.  She had no significant chest pain.  She was  diaphoretic.  EMS was called, and the patient was in acute respiratory  distress.  She was in atrial fibrillation with a rapid ventricular  response and rate of 160.  On subsequent presentation to the emergency  room, the patient was in respiratory failure, required intubation.  She  was also relatively hypotensive.  The patient was admitted for further  medical treatment.   For details of her past medical history, social history, family history,  please see admission history and physical.   LABORATORY DATA:  PH was 7.3, pCO2 of 37, pO2 of 100, bicarb of 18.  This was after intubation.  White count was 14,300, hemoglobin 16.1,  hematocrit 48.6, platelets 240,000.  Sed rate was 30.  Pro-time was 23.5  with an INR of 2.  PTT was 34.  Sodium was 137, potassium 3.9, chloride  105, CO2 21, BUN 11, creatinine 0.82, glucose initially was 272.  It  subsequently returned to normal during her hospital stay.  Calcium  levels were normal.  Albumin is 3.4, AST was 54.  All other  liver  function studies were normal.  Magnesium is 2.1.  Lactic acid level was  1.6.  Initial CK was 269 with 18.5 MB, then increased to 367 with 23 MB  before subsequently declining again. Troponin peaked at 10.02.  BNP  level was 636 on admission, climbed to 951.  At the time of discharge,  it was down to 144.  TSH was 0.679.  ECG showed atrial fibrillation,  rapid ventricle response and a left bundle branch block which was new.  Chest x-ray showed diffuse bilateral air space disease, consistent with  pulmonary edema.   HOSPITAL COURSE:  The patient was admitted to intensive care unit.  She  was maintained on a little later support.  She was sedated while on the  ventilator.  She was begun on anticoagulation with subcu Lovenox.  She  was given IV Lasix.  Initially, she was started on IV Cardizem, but due  to aggressive hypotension, Cardizem was discontinued.  She was loaded  with amiodarone, due to refractory a-fib with rapid  rate. Because of  hypotension, she required Neo-Synephrine IV for blood pressure support.  Echocardiogram was obtained.  This demonstrated mild LVH with global  hypokinesia and overall ejection fraction approximately 40%.  There was  septal __________.  There were no clear otherwise regional wall motion  abnormalities.  She had mitral valve thickening and mitral annular  calcification with moderate 2+ mitral insufficiency.  There was mild  left atrial enlargement.   Critical care medicine was consulted to manage her ventilator support.  The patient did diurese well with improvement in her pulmonary edema  pattern on chest x-ray and improvement in oxygenation.  She was able to  be extubated March 15, 2007 and had no further respiratoria problems  during her hospital stay.  Her congestive heart failure improved  significantly with diuresis, and she was able to be switched over to  oral diuretics.  She did require potassium repletion.  She was also  switched to oral  amiodarone and Lanoxin.  She converted to normal sinus  rhythm on March 16, 2007.  She remained in sinus rhythm, until March 20, 2007, when she had recurrent atrial fibrillation and rapid ventricular  response.  We increased her amiodarone dose orally and she was placed on  IV Cardizem, subsequently switched to p.o.  She again converted back to  normal sinus rhythm, remained in sinus rhythm for the remainder of her  hospital stay.   Given her new left bundle branch block and new onset of heart failure  with non-Q-wave myocardial infarction, we did recommend the patient  undergo cardiac catheterization.  This was performed on March 19, 2007.  This demonstrated moderate calcification LAD with 40-50% narrowing in  the mid-vessel.  There was no significant disease in the diagonal  vessels or the circumflex.  The right coronary was a large dominant  vessel that was calcified.  There was an 80-90% stenosis in the mid-  vessel and 90% stenosis distally prior to the previous stent.  The old  stent in the distal right coronary was still patent.  Left ventricular  angiography demonstrated inferior hypokinesia with ejection fraction of  45% and 2+ mitral insufficiency.  We proceeded with stenting of the mid  and distal right coronary overlapping stents.  The more distal segment  was stented with a 3.0 x 24-mm TAXUS in the mid-vessel, with a 3.0 x 28-  mm TAXUS.  This was dilated up to 3.0 in the distal vessel, with up to  3.5 mm in the proximal vessel.  Excellent angiographic result was  obtained with 0% residual stenosis and TIMI grade 3 flow.  The patient  was loaded with oral Plavix.  She was continued on Integralin for 18  hours post intervention.  She was to continue on aspirin.  Given the  fact that she had a recurrent episode of atrial fibrillation prior to  discharge, we did recommend also resuming her Coumadin, but since we have started her amiodarone, we resumed her lower to 3 mg per day.   She  had complete resolution of congestive heart failure.  She was  ambulatory, had no recurrent chest pain, was felt that she was stable  for discharge on March 21, 2007.  At the time of discharge, her sodium  was 141, potassium 3.5, chloride 107, CO2 28, BUN 14, creatinine 0.84,  glucose of 90.  Her INR was 1.1.  BNP level was down to 144 and dig  level was 0.7.   DISCHARGE DIAGNOSES:  1.  Acute respiratory failure, secondary to acute pulmonary edema.  2. Acute pulmonary edema, multifactorial with atrial fibrillation,      rapid ventricle response, left ventricular dysfunction and ischemic      heart disease.  3. Non-Q-wave myocardial infarction.  4. Coronary artery disease with remote inferior myocardial infarction,      remote stenting of the distal right coronary, now with new stenting      of the mid and distal right coronary artery.  5. Atrial fibrillation with rapid ventricular response, recurrent.  6. Moderate mitral insufficiency.  7. Hypertension.  8. Hypercholesterolemia.  9. Chronic anticoagulation.   DISCHARGE MEDICATIONS:  1. Coated aspirin 325 mg per day.  2. Plavix 75 mg daily.  3. Diovan 60 mg per day.  4. Lanoxin 0.125 mg per daily.  5. Potassium 20 mEq daily.  6. Crestor 10 mg per day.  7. Coumadin 3 mg daily.  8. Lasix 40 mg per day.  9. Amiodarone 200 mg twice a day.  10.Diltiazem 1000 SR 120 mg per day.  11.Nitroglycerin 0.4 mg sublingual p.r.n.   The patient was instructed to stop her Betapace.  She remained on a low  sodium heart healthy diet.  She increased her activity slowly,  and she  was given heart failure instructions.  She will follow up with Dr.  Sw[place]iland in 1 week, will repeat a BMET and a pro-time.  At that time, her  discharge status is improved.           ______________________________  Peter M. [place], M.D.     PMJ/MEDQ  D:  03/21/2007  T:  03/21/2007  Job:  [id number]   cc:   C. Duane Lope, M.D.

## 2011-02-20 NOTE — Discharge Summary (Signed)
[name], OKEEFE NO.:  [id number]   MEDICAL RECORD NO.:  [id number]          PATIENT TYPE:  INP   LOCATION:  4713                         FACILITY:  [facility]   PHYSICIAN:  Peter M. Sw[place]iland, M.D.  DATE OF BIRTH:  1930/01/20   DATE OF ADMISSION:  03/08/2008  DATE OF DISCHARGE:  03/11/2008                               DISCHARGE SUMMARY   HISTORY OF PRESENT ILLNESS:  Ms. Jabbour is a 77 year old white female  with history of congestive heart failure, recurrent atrial fibrillation,  and coronary artery disease, who presented with acute pulmonary edema on  the day of admission.  She had abrupt onset of shortness of breath  without significant warning.  She had no significant chest pain or  arrhythmia.  When she presented to the emergency room, she was in sinus  rhythm, but chest x-ray demonstrated acute pulmonary edema.   For details of her past medical history, social history, family history  and physical exam, please see admission history and physical.   LABORATORY DATA:  Her BNP on admission was 918.  Point-of-care cardiac  markers were negative.  Serial CPK-MB and troponins were negative x2.  Magnesium was 2.3 and calcium 8.6.  Liver function studies were normal  with the exception of AST of 39.  Protime was 26 with an INR of 2.3.  Sodium was 139, potassium 4.3, chloride 105, CO2 of 25, BUN 14,  creatinine 0.81, and glucose was 206 and then subsequently came down to  normal levels.  Hemoglobin was 15.2, hematocrit 45.9, white count was  14,100, and platelets 243,000 Chest x-ray showed cardiomegaly with acute  pulmonary edema.  ECG showed normal sinus rhythm with a left bundle-  branch block.   HOSPITAL COURSE:  The patient was admitted to stepdown unit.  She was  aggressively diuresed with IV Lasix.  In reviewing her medical history,  it was unclear why she was not on an ACE inhibitor or angiotensin  receptor blocker.  She had been on Diovan in the past and I  could not  find a reason why this had been stopped.  We started her on Altace 5 mg  per day.  We later reduced this dose to 2.5 mg daily due to low blood  pressure.  She was maintained on Toprol.  On her first hospital day, the  patient developed atrial fibrillation and rapid ventricular response  with a heart rate of 120 beats per minute.  It was noted in her records  that she had previously failed Betapace therapy.  She was not a  candidate for 1C antiarrhythmic drugs due to her history of coronary  artery disease.  She developed significant pulmonary toxicity in the  past on amiodarone.  We, therefore, initiated her on Tikosyn beginning  with a standard dose of 500 mcg twice daily; however, after initiating  this, she had significant increase in her QTC up to 540 milliseconds.  We adjusted her dose down to 250 mcg twice daily and her QTC decreased  to 501 milliseconds, which was acceptable and represented a less than  15% increase over her baseline.  She remained in sinus rhythm throughout  the remainder of her hospital stay.  She diuresed very well and there  was resolution of her pulmonary edema by chest x-ray.  Her BNP level  declined to 269.  Her INRs remained therapeutic.  The patient was  switched from IV to oral Lasix at 40 mg b.i.d.  Her Foley and oxygen  were discontinued.  She was transferred to the telemetry and  progressively ambulated and did very well.  She was discharged home in  stable condition on March 11, 2008.   DISCHARGE DIAGNOSIS:  1. Acute pulmonary edema.  2. Acute on chronic congestive heart failure with both diastolic and      systolic dysfunction.  3. Recurrent atrial fibrillation.  4. Coronary artery disease with prior stenting of the right coronary.  5. Hyperlipidemia.  6. Hypertension.  7. History of iron deficiency anemia.  8. History of amiodarone and digoxin toxicity.  9. Left bundle-branch block.   DISCHARGE MEDICATIONS:  1. Coumadin 3 mg  alternating with 1.5 mg daily.  2. Crestor 20 mg per day.  3. Potassium 20 mEq twice daily.  4. Aspirin 81 mg daily.  5. Lasix 40 mg twice a day.  6. Iron 325 mg twice a day.  7. Protonix 40 mg per day.  8. Metoprolol XL 25 mg per day.  9. Altace 2.5 mg daily.  10.Tikosyn 250 mcg twice a day.   It was recommended the patient to have followup with Dr. Swaziland in 1  week.  Repeat ECG at that time.  We will also check a BMET, protime, and  BNP level.   DISCHARGE STATUS:  Improved.           ______________________________  Peter M. [place], M.D.     PMJ/MEDQ  D:  03/11/2008  T:  03/11/2008  Job:  [id number]   cc:   Bernette Redbird, M.D.  Vianne Bulls, M.D.

## 2011-02-20 NOTE — Consult Note (Signed)
[name], [name]:  [id number]   MEDICAL RECORD NO.:  [id number]          PATIENT TYPE:  INP   LOCATION:  2006                         FACILITY:  [facility]   PHYSICIAN:  Ramiro Harvest, MD    DATE OF BIRTH:  04/15/1930   DATE OF CONSULTATION:  DATE OF DISCHARGE:                                 CONSULTATION   PRIMARY CARE PHYSICIAN:  C. Duane Lope, M.D.   CARDIOLOGIST:  Peter M. Swaziland, M.D.   HISTORY OF PRESENT ILLNESS:  [name] is a 77 year old white  female, history of atrial fibrillation, coronary artery disease status  post MI with PCI, hospitalized in June for acute respiratory failure  secondary to CHF, in AFib, who presents to the ED with a 6 to 12 hour  history of worsening shortness of breath, epigastric/substernal  bilateral chest pain.  The patient's husband gave her some nitroglycerin  last night with no relief.  EMS was called, and the patient was brought  to the ED.  The patient denied any palpitations.  No wheezes.  No fever.  No chills.  No cough.  No other associated symptoms.   ALLERGIES:  NO KNOWN DRUG ALLERGIES.   PAST MEDICAL HISTORY:  1. Recurrent atrial fibrillation with RVR.  2. History of mitral valve prolapse and mitral insufficiency.  3. Dyslipidemia.  4. Coronary artery disease status post remote inferior MI, remote      stenting of distal RCA, now with new stent of mid and distal RCA.  5. Non Q wave MI.  6. Hypertension.  7. Acute respiratory failure secondary to acute pulmonary edema in      June of 2008.  8. Hyperlipidemia.  9. On chronic anticoagulation.   MEDICATIONS:  Home medications:  1. Aspirin 325 mg p.o. daily.  2. Plavix 75 mg p.o. daily.  3. Amiodarone 200 mg p.o. daily.  4. Lanoxin 0.125 mg p.o. daily.  5. Cardizem 120 mg daily.  6. Nitro 0.4 mg sublingual p.r.n.  7. Diovan 320 mg daily.  8. Crestor 10 mg daily.  9. Coumadin 2.5 mg Tuesday, Wednesday, Friday, Saturday, Sunday and 5      mg on  Monday and Thursday.   SOCIAL HISTORY:  The patient lives at home with her husband in  [place].  Positive tobacco history at 30 pack a day times 57 years.  No alcohol abuse.  No IV drug use.  The patient has 4 children who are  healthy.   FAMILY HISTORY:  Was significant for coronary artery disease.   REVIEW OF SYSTEMS:  Per HPI, otherwise negative.  GENERAL:  The patient  denies any recent fever, chills.  No weight loss.  No nausea.  No  vomiting.  No hemoptysis.  No cough.  RESPIRATORY:  No cough.  No  wheezing.  Does endorse shortness of breath.  CARDIOVASCULAR:  Notes  some epigastric/substernal chest pain.  Positive shortness of breath.  No palpitations.  Positive diaphoresis.  No syncope.  No lower extremity  edema.  GI:  No nausea.  No vomiting.  No abdominal pain.  No diarrhea.  No constipation.  No melena.  No hematochezia.  No hematemesis.  GU:  No  hematuria.  No dysuria.   PHYSICAL EXAMINATION:  VITAL SIGNS:  Temperature 98.6.  Blood pressure  151/72.  Pulse of 132.  Respiratory rate 36.  Saturating 72% on room  air.  GENERAL:  The patient is an elderly female on BiPAP.  HEENT:  Normocephalic, atraumatic.  Pupils equal, round and reactive to  light.  Extraocular movements are intact.  Oropharynx is dry.  No  lesions.  NECK:  Supple.  No lymphadenopathy.  RESPIRATORY:  Lungs are with bilateral crackles, right greater than  left.  CARDIOVASCULAR:  Tachycardic.  Irregularly irregular.  ABDOMEN:  Soft, nontender, nondistended.  Positive bowel sounds.  EXTREMITIES:  No clubbing, cyanosis or edema.   LABORATORY DATA:  BNP 399.  PTT 34.  PT 20.4.  INR 1.7.  ABG:  A pH of  7.41, PCO2 of 32, PO2 of 41, bicarb of 20.3.  A white count of 24.3,  hemoglobin 15.6, platelets 233, ANC 21.6.  D-dimer of 0.99.  Blood  cultures are pending.  Sodium 138, potassium 4.1, chloride 106, bicarb  21, BUN 17, creatinine 0.92, glucose of 218.  A chest x-ray bilateral:  Pulmonary edema, right  greater than left.   ASSESSMENT/PLAN:  [name] is a 77 year old with a history of  AFib, CHF, coronary artery disease status PCI presents to the ED in  respiratory distress.   1. Acute respiratory distress likely multifactorial in nature      including a CHF, COPD exacerbation versus pneumonia versus atrial      fibrillation.  We will pan culture patient.  Enzymes will be cycled      every 8 hours times 3.  An EKG will be checked.  The patient will      be placed on BiPAP.  We will check an ABG after 2 hours and titrate      her oxygen as needed.  If no improvement with her BiPAP, the      patient may need to be intubated and a Critical Care consult will      thus be needed.  We will go ahead and also treat patient      empirically with vancomycin and Zosyn and diurese patient and rate      control patient's AFib per Cardiology.  2. Leukocytosis.  We will pan culture patient.  Treat empirically with      vanc and Zosyn until cultures return and follow her temperature.  3. Atrial fibrillation.  Rate control per Cardiology and      anticoagulation per CARDS.  4. Hypertension.  We will just follow her blood pressure and monitor      it with diuresis.  We will hold the rest of her blood pressure      medications for now until acute respiratory distress resolves.  5. Hyperlipidemia.  Continue statin and per CARDS.  6. Prophylaxis.  Protonix with GI and Coumadin for DVT.   It has been a pleasure taking care of Ms. [name].      Ramiro Harvest, MD  Electronically Signed    DT/MEDQ  D:  08/08/2007  T:  08/09/2007  Job:  [id number]

## 2011-02-20 NOTE — Assessment & Plan Note (Signed)
OFFICE VISIT   VIRGIL, SLINGER  DOB:  03-07-1930                                        September 23, 2008  CHART #:  [id number]   The patient returns to the office today in followup after her coronary  artery bypass grafting, mitral valve repair, and right and left-sided  maze with endovein vein harvesting in August 2009.  She currently  continues to be as depressed as she was on postoperatively, but it  sounds from more physical activity level, it is much improved.  She  continues in the cardiac rehabilitation program.  Has not had anymore  readmissions to the emergency room with rapid atrial fibrillation and  cardiac decompensation.  She does complain of poor appetite and not  eating much.   PHYSICAL EXAMINATION:  VITAL SIGNS:  Her blood pressure is 104/63, heart  rate is 83 and regular, respiratory rate is 18, and O2 sats 96%.  CHEST:  Her sternum is stable and well healed.  CARDIAC:  I do not appreciate any murmur of mitral insufficiency.  EXTREMITIES:  She has no pedal edema.   Rhythm strip in the office shows a DDD pacing.   She continues on Crestor, Lopressor, aspirin 81 mg, Coumadin,  spironolactone, Lexapro, digoxin, pantoprazole 40 mg a day, Lasix 20 mg  a day, and potassium supplementation.   Followup chest x-ray shows clear lung fields bilaterally with no  effusions.   Overall, I am pleased with her progress.  She seems to be doing  reasonably well from an activity level.  She continues to need  encouragement on p.o. appetite and I have encouraged her to  continue with the cardiac rehabilitation program.  I have not made her a  return appointment, but would be glad to see her at Dr. Elvis Coil request  any time.   Sheliah Plane, MD  Electronically Signed   EG/MEDQ  D:  09/23/2008  T:  09/23/2008  Job:  [id number]   cc:   Peter M. [place]il[place], M.D.

## 2011-02-20 NOTE — Discharge Summary (Signed)
[name], [name] NO.:  [id number]   MEDICAL RECORD NO.:  [id number]          PATIENT TYPE:  INP   LOCATION:  4743                         FACILITY:  [facility]   PHYSICIAN:  [name], M.D.  DATE OF BIRTH:  May 24, 1930   DATE OF ADMISSION:  11/29/2007  DATE OF DISCHARGE:  12/04/2007                               DISCHARGE SUMMARY   HISTORY OF PRESENT ILLNESS:  Mrs. [name] is a 77 year old white female  with complex medical history.  She has known history of ischemic heart  disease in atrial fibrillation who presented on this occasion with  syncope.  The patient was recently hospitalized for congestive heart  failure exacerbation.  She also was diagnosed with pulmonary toxicity  from amiodarone with elevated sed rate and significant decrease in her  diffusion capacity.  As a result, her amiodarone was discontinued.  For  her atrial fibrillation, she was treated for rate control with Lanoxin  and diltiazem.  She was discharged on November 20, 2007. She  subsequently developed progressive nausea, anorexia and diarrhea.  She  continued on her medications.  On the day of admission, she was very  weak and lightheaded.  She went to the bathroom and passed out hitting  her head during her fall.  When EMS responded, she had a heart rate in  the 30s with a low junctional rhythm and was transcutaneously paced. She  was also she was also significantly hypotensive.  She was transferred to  [facility] and admitted for further management.  Of note, the patient  has a history of congestive heart failure with at least two episodes of  flash pulmonary edema.  Her baseline ejection fraction was approximately  45%.  She has a history of atrial fibrillation.  She has had prior  extensive stenting of the right coronary artery in 2000, early 2008.  She also has a history of hypertension, hyperlipidemia and iron  deficiency anemia.  On her prior hospital admission she had  heme-  negative stools.   Details of her past medical history, social history, family history and  physical exam,  please see admission history and physical.   LABORATORY DATA:  The ECG on admission showed atrial fibrillation with a  ventricular escape rhythm in the 30s.  CT of the head showed  subarachnoid hemorrhage overlying the frontal lobes bilaterally.  There  was no mass effect or hydrocephalus.  There is no evidence of acute  infarction.  Chest x-ray showed cardiac enlargement with vascular  congestion but no significant edema.  Hemoglobin was 8, hematocrit 24.9,  white count was 16,500, platelets 366,000.  Sodium was 141, potassium  4.7, chloride 107, CO2 27, BUN 41, creatinine 1.43, glucose of 114.  Protime was 25.3 with an INR of 2.2.  Liver function studies were  normal.  Albumin is 3.1.  Troponins were 0.06 and 0.07.  CK was 116 with  1.9 MB, and 132 with 2.3 MB.  Calcium was 8.1.  Digoxin level was 4.2.  BNP level was 833.  Occult blood stool was positive.   HOSPITAL COURSE:  The patient was  admitted to ICU level bed.  She was  initially transcutaneously paced with improvement of her hypotension.  She subsequently had a temporary transvenous pacemaker surgery in the  right femoral vein by Dr. Donnie Aho.  Given her high digoxin level,  She  was given Digibind IV.  Her Cardizem was held.  Also she was given IV  hydration.  Her next laboratory evaluation showed a drop in her  hemoglobin to 5.7 with hematocrit of 17.5.  She was transfused with 4  units of packed red blood cells.  She was also given 2 units of fresh  frozen plasma.  With this her hemoglobin corrected to 10.4 and remained  stable throughout remainder of her hospital stay.  Her protime was  normal.  With hydration and transfusion, her renal insufficiency also  normalized.  At the time of discharge her BUN was 11, creatinine 0.96.  With the Digibind therapy and holding her medications, her arrhythmia  improved.   She was able to have her transcutaneous pacemaker removed 48  hours later.  She remained in atrial fibrillation but had a controlled  ventricular spines was despite being on no IV nodal blocking agents.  During the remainder of her hospital stay she did have some periods of  sinus rhythm but was predominantly in atrial fibrillation/flutter with a  controlled ventricular response in the 70's.  The patient had no  symptoms of shortness of breath and BNP level was elevated  but chest x-  ray continued to show no significant edema.  She was started back on  Lasix at 40 mg per day and tolerated this well.  We tried to increase it  to twice a day.  She became hypotensive and so we cut her back to once a  day.  Her potassium was repleted to normal.  Her digoxin level prior to  discharge was 0.9.  Neurosurgery, Dr. Phoebe Perch, evaluated the patient for  some subarachnoid bleed.  It is recommend that we hold her aspirin,  Plavix and Coumadin.  Serial CT scans did show resolution of  subarachnoid hemorrhage.  Dr. Phoebe Perch felt that we can resume aspirin at  this point but recommended holding Coumadin for another 4-6 weeks to  give this area time to heal.   It was felt that the patient's heme-positive stools were related to her  coagulopathy.  Her hemoglobin remained stable after transfusion.  It was  recommended that she be evaluated from a gastrointestinal standpoint the  sometime within the next 4 weeks including colonoscopy.  It was felt  that this could be safely done as an outpatient once her acute illness  had resolved.  The was progressively ambulated and did well.  She was  discharged home in stable condition on December 04, 2007.   DISCHARGE DIAGNOSIS:  1. Syncope due to marked bradycardia in digitoxicity and volume      depletion.  2. Subarachnoid hemorrhage due to frontal trauma.  3. Digitoxicity.  4. Atrial fibrillation, persistent.  5. Anemia of acute blood loss with heme-positive  stools.  6. Iron deficiency.  7. Hypokalemia.  8. Congestive heart failure.  9. Coronary artery disease, status post stenting of the right      coronary.  10.Hyperlipidemia.  11.Chronic mitral insufficiency.   DISCHARGE MEDICATIONS:  1. The patient is instructed to hold her Plavix, Coumadin, Cardizem,      and digoxin.  2. She will remain on Lasix 40 mg once a day.  3. Potassium 20 mEq once a day.  4. Aspirin 81 mg per day.  5. Crestor 10 mg per day.  6. Ativan p.r.n.  7. Fluorostar 250 mg twice a day.  8. Iron sulfate 325 mg twice a day.   She is instructed to remain on a low-sodium diet, slowly increase her  activity.  I have asked her to monitor her blood pressure and pulse  daily and let me know if her heart rate increases over 100.  I will plan  on seeing her back in one  week.  If she is stable at that time, we will then refer her for GI  evaluation.  As per neurosurgery, will hold Coumadin therapy for the  next 4-6 weeks.   DISCHARGE STATUS:  Improved.           ______________________________  [name], M.D.     PMJ/MEDQ  D:  12/04/2007  T:  12/05/2007  Job:  [id number]   cc:   Sigmund Hazel, M.D.  Bernette Redbird, M.D.

## 2011-02-20 NOTE — Op Note (Signed)
[name], [name]             ACCOUNT NO.:  [id number]   MEDICAL RECORD NO.:  [id number]          PATIENT TYPE:  INP   LOCATION:  2315                         FACILITY:  [facility]   PHYSICIAN:  Sheliah Plane, MD    DATE OF BIRTH:  06-Jul-1930   DATE OF PROCEDURE:  05/11/2008  DATE OF DISCHARGE:                               OPERATIVE REPORT   PREOPERATIVE DIAGNOSES:  1. Coronary occlusive disease.  2. Mitral regurgitation.  3. Intermittent significant atrial fibrillation.   POSTOPERATIVE DIAGNOSES:  1. Coronary occlusive disease.  2. Mitral regurgitation.  3. Intermittent significant atrial fibrillation.   SURGICAL PROCEDURES:  Coronary artery bypass grafting x2 with the left  internal mammary to left anterior descending coronary artery and  reversed saphenous vein graft to the posterior descending coronary  artery.  Mitral valve repair with [facility], annuloplasty  ring model 5200, 26 mm, serial number [id number] and right and left-sided  MAZE procedure, and right thigh endovein harvesting.   SURGEON:  Sheliah Plane, MD   FIRST ASSISTANT:  Salvatore Decent. Dorris Fetch, MD   SECOND ASSISTANT:  Theda Belfast, [place]   BRIEF HISTORY:  The patient is a 77 year old female who has had 7  admissions to the hospital with acute exacerbation of rapid atrial  fibrillation and congestive heart failure, several resulting in acute  intubation because of pulmonary collapse.  Because of pulmonary  insufficiency, she has also been noted to be intolerant of amiodarone.  At most recent admission, the patient came in with flash pulmonary  edema, was intubated, initially stabilized.  Further evaluation showed  severe mitral regurgitation.  She has known coronary occlusive disease,  having had angioplasty and stents placed in the right coronary artery  previously.  Repeat cardiac catheterization showed depressed left  ventricular function, moderate-to-severe mitral regurgitation, total  occlusion of the distal right coronary artery, and 70% lesion of the  LAD.  The circumflex was relatively a small vessel, but without  significant disease.  Because of the patient's repeated episodes of  flash pulmonary edema, further evaluation was recommended to the patient  including coronary artery bypass grafting, mitral valve repair and/or  replacement, and MAZE procedure.  The patient agreed and signed informed  consent.   DESCRIPTION OF PROCEDURE:  With Swan-Ganz and arterial line monitors in  place, the patient underwent general endotracheal anesthesia without  incidents.  The skin of the chest and legs were prepped and draped in  the usual sterile manner.  A TEE probe was used throughout the procedure  to evaluate pre and postoperative mitral regurgitation and LV function  and was dictated under a separate note.  Using a [employer] endovein  harvesting system, vein was harvested from the right thigh and was of  good quality and caliber.  Median sternotomy was performed.  Left  internal mammary artery was dissected down as pedicle graft.  Distal  artery was divided and had a good free flow.  Pericardium was opened.  Overall, ventricular function appeared depressed.  The patient was  systemically heparinized.  Ascending aorta was cannulated, the superior  and inferior vena cava.  Right  angle cannulas were placed.  A retrograde  cardioplegia catheter was placed.  Aortic root vent cardioplegia needle  was introduced into the ascending aorta.  The patient was placed on  cardiopulmonary bypass 2.4 liters per minute per meter squared.  Initially, using the Medtronic RF ablation device, lesions were placed  across the base of the left atrial appendage and also the left-sided  pulmonary veins.  Coronary artery bypass grafting was then carried out  to the posterior descending with a segment of reversed saphenous vein  graft into the left anterior descending coronary artery and the left   internal mammary artery was used to anastomose to the left anterior  descending coronary artery with a running 8-0 Prolene.  Throughout the  procedure, intermittent cardioplegia was administered on the vein graft  and also retrograde and into the aortic root.  Attention was then turned  to the mitral valve repair.  The left atrium was opened along the intra-  atrial groove.  This gave good visualization of the mitral valve and the  atrial appendage in the left atrial. Completion of the left-sided MAZE  was carried out with both the bipolar and unipolar device to complete  the lesion set around the pulmonary veins crossing lesions.  A box  between the pulmonary veins was created and also lesions to the base of  the atrial appendage and mitral valve.  With this completed, the left  atrial appendage was doubly closed with a running 4-0 Prolene.  Attention was then turned to the mitral valve itself.  There was no  evidence of flail leaflets.  The annulus did appear dilated.  There was  slight prolapse of the anterior leaflet in P-A2 and A3.  A 26 Physio 2  ring was selected and sized.  The #2 Tycron pledgeted sutures were  placed circumferentially around the annulus and were then secured and  the ring was secured in place.  This gave a good approximation and  coaptation of the anterior and posterior leaflets and passive filling  held well.  The left atrial incision was then closed in 2 layers with  horizontal mattress 3-0 Prolene and a running over-and-over 3-0 Prolene.  Prior to complete closure, the sutures were left untied to the de-air  the heart through the left atrium.  Attention was then turned to the  right-sided MAZE.  A crossing was made in the right atrial appendage.  This allowed bipolar lesions to the superior and inferior vena cava  across the base of atrial appendage in the posterior wall of the atrium.  After complete lesion set was created on the right side, the bulldog was   removed from the mammary artery with prompt rise in the myocardial  septal temperature.  The right heart was allowed to passively fill and  the right atriotomy closure was completed.  The heart was allowed to  passively fill and de-air through the left atrial appendage, which was  then closed in additional de-airing through the ascending aorta for  removal of the crossclamp.  The single venous anastomosis to the  proximal aorta was carried out.  After crossclamp was removed, further  de-airing with a #16 gauge needle in the left ventricular apex was  carried out.  The patient was spontaneously converted to a slow  sinus/junctional rhythm.  Atrial and ventricular pacing wires were  applied.  Graft markers were applied.  The heart was allowed to  passively fill.  On TEE, there was no evidence of mitral  regurgitation.  On low-dose dopamine and milrinone, the patient was then ventilated and  weaned from cardiopulmonary bypass, ultimately separated from bypass,  remained hemodynamically stable, decannulated in the usual fashion.  Protamine sulfate was administered because of coagulopathy.  Platelets  and fresh frozen were also administered.  The patient remained  hemodynamically stable.  Pericardium was loosely reapproximated in the  left pleural tube and a Blake mediastinal drain was left in place.  Sternum was closed with #6 stainless steel wire.  Fascia was closed with  interrupted 0 Vicryl, running 3-0 Vicryl in the subcutaneous tissue, 4-0  subcuticular stitch in skin edges.  Dry dressings were applied.  Sponge  and needle count was reported as correct at the completion of procedure.  Because of blood loss anemia during the procedure and dilution while on  bypass, she did require packed red blood cells.      Sheliah Plane, MD  Electronically Signed     EG/MEDQ  D:  05/14/2008  T:  05/15/2008  Job:  [id number]   cc:   Peter M. Sw[place]il[place], M.D.

## 2011-02-20 NOTE — Assessment & Plan Note (Signed)
OFFICE VISIT   [name], [name]  DOB:  10-25-1929                                        June 24, 2008  CHART #:  [id number]   The patient returns to the office today in followup after her recent  coronary artery bypass grafting, mitral valve repair with annuloplasty  ring, and left and right-sided maze done on May 11, 2008.  The patient  also had episodes of bradycardia with episodes of rapid atrial  fibrillation and ultimately also ended up having a DDD pacemaker placed  postoperatively.  Considering her very frail status and depressed  affect, she seems to be doing reasonably well.  Since discharge, she  notes she has had some increase in appetite, but she did lose a  significant amount of weight up to 16 pounds postoperatively.  She is to  start in cardiac rehabilitation as an outpatient next week.  She has  been having Physical Therapy come to her house and she is now walking  with a cane around the house and not using the walker all the time.  She  is not in obvious heart failure.   PHYSICAL EXAMINATION:  VITAL SIGNS:  Her blood pressure 125/71, pulse is  84, respiratory rate is 18, and O2 sats 96%.  CARDIAC:  Her incisions are all well healed.  The left pacemaker pocket  is also well healed.  I do not appreciate any murmur of mitral  insufficiency.  EXTREMITIES:  She has no pedal edema.   Followup chest x-ray shows very small right pleural effusion.  There is  a question on today's film of bilateral upper lobe.  Shadows they were  not appreciated on other films, so we will obtain a followup film in  several months to make sure there is nothing significant  I can decide  at that time if CT scan of the chest is warranted.  She continues on  Crestor, Lopressor, aspirin, Coumadin, spironolactone, Lexapro, digoxin,  furosemide, potassium, and Protonix..   Overall, I am pleased with her progress.  I will plan to see her back in  3 months with  a followup chest x-ray.   Sheliah Plane, MD  Electronically Signed   EG/MEDQ  D:  06/24/2008  T:  06/25/2008  Job:  [id number]   cc:   Peter M. [place], M.D.

## 2011-02-20 NOTE — H&P (Signed)
[name], [name] NO.:  [id number]   MEDICAL RECORD NO.:  [id number]          PATIENT TYPE:  INP   LOCATION:  2111                         FACILITY:  [facility]   PHYSICIAN:  Charlcie Cradle. Delford Field, MD, FCCPDATE OF BIRTH:  Nov 20, 1929   DATE OF ADMISSION:  05/03/2008  DATE OF DISCHARGE:                              HISTORY & PHYSICAL   CHIEF COMPLAINT:  Pulmonary edema and respiratory failure.   HISTORY OF PRESENT ILLNESS:  This is a 77 year old complex woman who has  history of congestive failure; coronary artery disease; recurrent atrial  fibrillation; multiple recurrent admissions, last one in June 2009.  She  is the patient of Dr. Peter Swaziland.  This morning, she had acute  shortness of breath, brought to the emergency room in the early morning  hours, unfortunately required intubation.  Husband later comes in and  says he is not wanting full support, but yet she is already intubated.  She really had no increased edema or weight gain.  She was initially  going back into rapid atrial fibrillation.  She was in this when she  first arrived and now with amiodarone, has slowed down, although there  is a history of amiodarone intolerance.  Chest x-ray showed acute  pulmonary edema and she is also hypotensive.  Last echo in February 2009  showed an EF of 45%, mitral valve prolapse, and mitral insufficiency.  She has failed therapy with Betapace.  When she was treated with  amiodarone, she had pulmonary toxicity, this was discontinued in  February 2009.  She also cannot tolerate digoxin.  She is status post  proximal to distal right coronary artery stenting in June 2008, with  Taxus drug-eluting stent, has had chronic aspirin and Coumadin, now on  Tikosyn and increased beta-blocker at last admission.   PAST MEDICAL HISTORY:  Medical history of ischemic cardiomyopathy,  ejection fraction of 45% with inferior wall akinesis; coronary artery  disease with stenting in right  coronary artery in 1996, again in 2008,  with drug-eluting stent; history of recurrent paroxysmal atrial  fibrillation; history of hyperlipidemia; hypertension; iron-deficiency  anemia; dig toxicity; pulmonary amiodarone toxicity; previous colon and  cecal polyp removal; and left bundle-branch block.   CURRENT MEDICATIONS:  Now include Coumadin, Crestor 20 mg daily,  potassium daily, Lasix 40 mg daily, iron 325 mg b.i.d., Protonix 40 mg  daily, metoprolol 25 mg b.i.d., and Tikosyn 250 mcg b.i.d.   ALLERGIES:  Intolerant to AMIODARONE and DIGOXIN.   SOCIAL HISTORY:  Lives at home with her husband.  She denies tobacco or  alcohol, has children.   FAMILY HISTORY:  Positive for coronary artery disease in her father.  Cancer in mother.   REVIEW OF SYSTEMS:  Otherwise noncontributory.   PHYSICAL EXAMINATION:  GENERAL:  This is an ill-appearing woman.  The  patient is orally intubated.  VITAL SIGNS:  Blood pressure is 78/50; pulse now 76 on amiodarone,  atrial fibrillation.  HEENT:  ETT in oropharynx.  NECK:  There is jugular venous distention.  CHEST:  Bilateral rales and poor air flow.  CARDIAC:  Irregular rate and rhythm  and S3 gallop.  Normal S1 and S2.  There is a murmur of mitral insufficiency.  ABDOMEN:  Distended.  Bowel sounds hypoactive.  EXTREMITIES:  Cool.  Poorly perfused.  There is minimal edema in the  lower extremities.  NEUROLOGIC:  The patient is sedated on vent support.   LABORATORY DATA:  At time of dictation, Hemoccult of the stool was  negative.  Troponin-I 0.05, lipase is 21, and CK 87.  CMP; sodium 139,  potassium 4.1, chloride 103, CO2 of 18, BUN 19, and creatinine 0.9.  Liver functions; alk phos 161, SGOT 201, SGPT 137, albumin 4.0, calcium  9.5, bilirubin 1.0, BNP 2093, and lactic acid is 5.  White count is  14,500, hemoglobin 17.4, platelet count 248,000, and CK-MB is 3.4.  Chest x-ray showed acute pulmonary edema, endotracheal tube and central  line in  good position.  Atrial fibrillation, left bundle branch block on  the ECG is noted, rate in 70s to 90s.   IMPRESSION:  Acute pulmonary edema on the basis of diastolic and  systolic heart failure with coronary artery disease and recurrent  paroxysmal atrial fibrillation.  When the patient goes into a rapid  rate, she is intolerant of this; with associated mitral valve prolapse  and mitral insufficiency, which exacerbates with increased left atrial  pressure.  She has coronary artery disease; history of hypertension;  iron-deficiency anemia, although hemoglobin is good now; cecal polyp;  amiodarone and digoxin toxicity.   RECOMMENDATIONS:  Admit to ICU to the Critical Care Service.  Consult  with Cardiology for further rhythm management, end-of-life goals with  clarification, discussions with family, BAL, blood cultures, empiric  Zosyn with high lactic acid and white count.  Rule out underlying  pneumonia. Resume Tikosyn.  Discontinue amiodarone.  The emergency room  had started full vent support.  See orders.      Charlcie Cradle Delford Field, MD, [facility]  Electronically Signed     PEW/MEDQ  D:  05/03/2008  T:  05/03/2008  Job:  [id number]

## 2011-02-20 NOTE — Consult Note (Signed)
NAMEMARLAINE, [name] NO.:  [id number]   MEDICAL RECORD NO.:  [id number]          PATIENT TYPE:  INP   LOCATION:  2111                         FACILITY:  [facility]   PHYSICIAN:  Peter M. Swaziland, M.D.  DATE OF BIRTH:  1929/11/14   DATE OF CONSULTATION:  DATE OF DISCHARGE:                                 CONSULTATION   HISTORY OF PRESENT ILLNESS:  [name] is a 77 year old white female  well-known to me.  She has a history of ischemic heart disease,  congestive heart failure, and atrial fibrillation.  This now represents  her 6th admission since June 2008, with acute pulmonary edema and atrial  fibrillation.  The patient according to the husband felt ill on  Saturday.  She felt better on "Sunday and was able to 4 loads of wash.  On Monday morning, today she woke at 4 a.m. with acute respiratory  distress, husband called EMS.  The patient was brought to the emergency  room, her status quickly deteriorated and she was intubated in the  emergency department.  She also received IV amiodarone, which resulted  in significant hypotension and bradycardia.  The patient is currently on  the ventilator.  She is able to follow commands and response to  questions.  The patient's last evaluation was echocardiogram in February  of this year, showed an ejection fraction of approximately 45% with  inferior wall akinesia.  She had moderate mitral insufficiency with  moderate mitral annular calcification.  There was very minimal aortic  stenosis.  It has been very difficult to regulate her atrial  fibrillation.  She had been on Betapace therapy, but had frequent  breakthrough on this and was subsequently switched to amiodarone.  She  did developed acute pulmonary toxicity on amiodarone and this was  discontinued.  In June of this year, she was started on Tikosyn therapy.  Initially, on her calculated dose of 500 mcg twice daily.  She developed  significant QT prolongation, this  resolved with reduction in her dose to  250 mcg b.i.d. with this she still had some breakthrough and atrial  fibrillation, but has done reasonably well.  It does appear that each of  her episodes of acute pulmonary edema are associated with atrial  fibrillation and rapid ventricular response.  She really develops flash  pulmonary edema with very little warning.   PAST MEDICAL HISTORY:  1. Coronary artery disease.  The patient has had stenting of the right      coronary artery in 1996 with a Palmaz-Schatz stent.  She underwent      repeat stenting of the right coronary in June 2008, using a 3.0 x      28"  mm Taxus stent.  2. Congestive heart failure.  Baseline ejection fraction of 45% with      inferior wall akinesia.  3. Chronic mitral insufficiency.  4. Recurrent atrial fibrillation.  5. Hyperlipidemia.  6. Hypertension.  7. Iron-deficiency anemia.  8. Status post removal of colon polyp and a large cecal polyp.  9. History of subarachnoid hemorrhage.  10.Left bundle-branch block.  11.History of amiodarone pulmonary  toxicity and digoxin toxicity.   CURRENT MEDICATIONS:  1. Coumadin 1.5 mg 3 days a week, 3 mg 4 days a week.  2. Crestor 20 mg per day.  3. Potassium 20 mEq per day.  4. Aspirin 81 mg per day.  5. Lasix 40 mg in the morning and 20 in the evening.  6. Iron 325 mg daily.  7. Protonix 40 mg per day.  8. Metoprolol 25 mg per day.  9. Tikosyn 250 mcg b.i.d.  10.Ramipril 2.5 mg daily.   SOCIAL HISTORY:  The patient is married.  She lives in [place] with  her husband.  Her husband is chronically ill with chronic leukemia and  renal failure.  She denies any history of tobacco or alcohol abuse.  She  has 4 children.   FAMILY HISTORY:  Father died at age 81 with cancer.  Mother died of  heart disease at age 39 with myocardial infarction.   REVIEW OF SYSTEMS:  Otherwise, unobtainable.  In reviewing history with  the husband, it does appear that the patient has a  significant sodium  intake.  She eats a lot of processed meats and foods, and they also eat  a lot at [employer] such as [employer].   PHYSICAL EXAMINATION:  GENERAL:  The patient is an elderly white female.  She was intubated and sedated.  VITAL SIGNS:  Her blood pressure is 124/48 on Levophed, pulse is 65 and  sinus rhythm.  Sats are 100% and 100% FIO2.  Her urine output is  approximately 480 mL over the last shift.  HEENT:  She is normocephalic and atraumatic.  Pupils are equal, round,  and reactive.  Extraocular movements are intact.  She is intubated.  NECK:  She has no JVD, adenopathy, thyromegaly or bruits.  LUNGS:  Bilateral rales.  CARDIAC:  Regular rate and rhythm with a grade 2/6 systolic murmur at  the apex.  ABDOMEN:  Soft and nontender.  There were no masses or bruits.  EXTREMITIES:  Without edema.  Pulses were palpable.  NEUROLOGIC:  She is able to open her eyes to command and follow basic  commands and moving all extremities.   LABORATORY DATA:  ECG initial presentation, atrial fibrillation with  rapid ventricular response rate of 144 and a left bundle branch block.  Current ECG shows normal sinus rhythm with a left bundle branch block.  Chest x-ray showed diffuse pulmonary edema.  Sodium is 139, potassium  4.1, chloride 103, bicarb of 18, glucose 243, BUN 19, and creatinine  0.94.  Calcium of 9.5, magnesium 2.3, AST is 201, ALT 137, total  bilirubin is 1.0, and lipase is 21.  CPK-MB and troponin are negative  x2.  Alkaline phosphatase 161, hemoglobin 17.4, hematocrit 53.0, white  count 14,500, and platelets 248,000.  PT is 21.2 with an INR of 1.7.  Arterial blood gas shows pH of 7.33, pCO2 is 64, pO2 is 76, and bicarb  of 33.  Urinalysis is negative.  BNP levels 2093.   IMPRESSION:  1. Acute pulmonary edema secondary acute on chronic combined systolic      and diastolic dysfunction and it is exacerbated by atrial      fibrillation, rapid ventricular response, and also  her dietary      indiscretion.  Also, possible significant component of mitral      insufficiency.  2. Recurrent atrial fibrillation, rapid ventricular response, now back      in sinus rhythm.  3. Coronary artery disease, prior stenting of  the right coronary      artery.  4. History of amiodarone pulmonary toxicity.  5. Hyperlipidemia.  6. Hypertension.  7. Iron deficiency anemia.  8. Chronic Coumadin therapy.  9. Prior subarachnoid hemorrhage.  10.Previous removal of a large cecal polyp.   PLAN:  Agree with current support with Levophed to blood pressure and IV  diuresis.  May need to consider dobutamine depending on LV function  assessment.  I would recommend to place her on Lovenox for  anticoagulation.  We will hold Coumadin at this time since she may  require cardiac catheterization for further evaluation.  I will  recommend a transesophageal echo tomorrow to further assess the degree  of her mitral insufficiency.  It may be that the patient has surgical  disease with need for mitral valve repair and could have a maze  procedure done at that time.  If she does not have surgical disease,  then we will consider for an atrial fibrillation ablation within the  near future.  We definitely need to hold amiodarone given her history of  toxicity.  Since she had significant bradycardia and junctional rhythm  this morning, we will hold her metoprolol and Tikosyn.  We will also  hold her Altace since she is hypotensive.  The patient is still  critically ill, but has responded well to diuresis in the past.           ______________________________  Peter M. Swaziland, M.D.     PMJ/MEDQ  D:  05/03/2008  T:  05/04/2008  Job:  [id number]   cc:   C. Duane Lope, M.D.

## 2011-02-20 NOTE — H&P (Signed)
[name], [name] NO.:  [id number]   MEDICAL RECORD NO.:  [id number]          PATIENT TYPE:  INP   LOCATION:  2905                         FACILITY:  [facility]   PHYSICIAN:  Peter M. Swaziland, M.D.  DATE OF BIRTH:  Mar 20, 1930   DATE OF ADMISSION:  11/29/2007  DATE OF DISCHARGE:                              HISTORY & PHYSICAL   CHIEF COMPLAINT:  Syncope.   HISTORY OF PRESENT ILLNESS:  Mrs. Sequeira is a 77 year old, Caucasian  woman with a history of ischemic cardiomyopathy, atrial fibrillation,  iron deficiency anemia, who presents with an episode of syncope.  The  patient was recently hospitalized for congestive heart failure and  pneumonia/pulmonary toxicity from Amiodarone.  After recovery, her  Digoxin, Diltiazem, and Lasix were increased for management of her  atrial fibrillation and acute on chronic systolic heart failure.  She  was discharged on November 20, 2007, but subsequently developed a  gastroenteritis at home with nausea, anorexia, and diarrhea.  Of note,  she had been treated with antibiotics during the hospitalization.  The  patient continued her medications during the significant anorexia and  diarrhea.  Today, the patient complained of significant weakness and  lightheadedness and subsequently collapsed in the bathroom.  She did hit  her head on the floor but remained conscious.  EMS noted a heart rate of  30, and subsequently transcutaneously paced her and brought her to the  emergency room.  In the ER, transcutaneous pacing was temporarily shut  off, which revealed a severe bradycardia of 30 and significant  hypotension down into the 60s.  Transvenous pacing was subsequently  restarted with improvement in her blood pressure to the 90s systolic.  The patient denied any chest pain during today's event.   PAST MEDICAL HISTORY:  1. Ischemic cardiomyopathy with an EF of 45 to 50% with inferior      akinesis.  2. History of coronary disease with an  MI, and RCA stent placement in      1996 and 2008.  3. Atrial fibrillation with a history of rapid ventricular rate.  4. History of respiratory failure requiring BiPAP.  5. Hyperlipidemia.  6. Hypertension.  7. Iron deficiency anemia with a baseline hematocrit ranging from 24      to 26.   MEDICATIONS:  1. Digoxin 0.25 mg p.o. daily.  2. Cardizem 240 mg p.o. daily.  3. Lasix 80 mg p.o. b.i.d.  4. Plavix 75 mg p.o. daily.  5. Coumadin 3 mg p.o. daily.  6. Potassium chloride 40 mEq p.o. b.i.d.  7. Aspirin 81 mg p.o. daily.  8. Crestor 10 mg p.o. daily.  9. Ativan p.r.n.  10.Iron sulfate one tablet p.o. b.i.d.  11.Senokot two tablets p.o. q.h.s. p.r.n.  12.Florastor 250 mg p.o. b.i.d.   SOCIAL HISTORY:  The patient lives in the [place] area with her  husband.  She denies any tobacco, alcohol, or drug use.   FAMILY HISTORY:  Notable for a mother with a history of cancer and a  father with a history of coronary artery disease.   REVIEW OF SYSTEMS:  Except for HPI, the  rest of the 12 review of systems  was reviewed and is negative.   PHYSICAL EXAMINATION:  VITAL SIGNS:  Temperature is afebrile, pulse is  paced at 60 with a blood pressure of 96/77, respiratory rate is 16, the  patient is on 100% non-rebreather with a saturation of 100%.  GENERAL:  The patient is awake, alert, oriented x3, and in no acute  distress.  HEENT:  Normocephalic with a left anterior forehead soft tissue swelling  at the site of fall.  Extraocular movements are intact.  Pupils are  equal, round, and reactive to light.  NECK:  No JVD, no carotid bruits.  CARDIOVASCULAR:  A regular rhythm in the setting of transcutaneous  pacing with a 2/6 systolic ejection murmur at the right upper sternal  border.  LUNGS:  Clear to auscultation bilaterally.  ABDOMEN:  Positive bowel sounds, soft, nontender, and nondistended.  EXTREMITIES:  No cyanosis, clubbing, or edema.  SKIN:  Mild tenting.  MUSCULOSKELETAL:  No  joint effusions or tenderness.  NEURO:  Cranial nerves II-XII grossly intact, no focal musculoskeletal  or sensory deficits.   X-RAYS:  No pulmonary edema.  There is cardiomegaly with mild vascular  congestion.  EKG is pending.  Telemetry strips demonstrate a severe  sinus bradycardia with a rate of 30 and a right bundle branch block.   LABORATORY DATA:  White count of 16.5, hemoglobin of 8.0, platelets of  366,000, INR of 4.3, a BUN of 46, creatinine is 2.0, glucose of 175,  potassium of 5.3, troponin is less than 0.05, CK is 158.   ASSESSMENT AND PLAN:  This is a 77 year old, Caucasian woman with a  severe symptomatic bradycardia secondary to medications in the setting  of acute renal failure exacerbated by higher dose Lasix, and diarrhea.  1. Bradycardia.  The patient will undergo a transvenous pacemaker as a      temporizing measurement, and this time we will do supportive care      given that the patient's acute renal failure is likely to resolve      with gentle hydration.  Digibind has been considered and may be      used if her recovery is not quick.  2. Anticoagulation.  The patient's INR is 4.3 likely in the setting of      anorexia.  We will initiate STAT fresh frozen plasma transfusions      in preparations for a transvenous pacemaker.  Coumadin will be      held.  Plavix and aspirin will be continued.  3. Digoxin.  There is a possibility of Digoxin toxicity; the level is      pending.  Digoxin, Cardizem, and Lasix have      been held.  4. Acute renal failure.  The patient will receive intravenous      hydration.  Renal function and electrolytes will be monitored.      Reginia Forts, MD  Electronically Signed     ______________________________  Peter M. Swaziland, M.D.    RA/MEDQ  D:  11/29/2007  T:  11/30/2007  Job:  [id number]

## 2011-02-20 NOTE — Discharge Summary (Signed)
NAMETOWANA, STENGLEIN NO.:  [id number]   MEDICAL RECORD NO.:  [id number]          PATIENT TYPE:  INP   LOCATION:  2006                         FACILITY:  [facility]   PHYSICIAN:  Vesta Mixer, M.D. DATE OF BIRTH:  11-23-1929   DATE OF ADMISSION:  08/08/2007  DATE OF DISCHARGE:  08/10/2007                               DISCHARGE SUMMARY   [name] is a 77 year old female who was admitted with  respiratory failure.  Please see dictated H&P for further details.   DISCHARGE DIAGNOSES:  1. Respiratory failure secondary to congestive heart failure and      bronchitis.  2. History of atrial fibrillation.  3. History of coronary artery disease.  4. Hypertension.  5. Hyperlipidemia.  6. Chronic anticoagulation.   DISCHARGE MEDICATIONS:  1. Avelox 400 mg a day.  2. Plavix 75 mg a day.  3. Diovan 160 mg a day.  4. Coumadin 5 mg on Mondays and Thursdays with 2.5 mg on Tuesdays,      Wednesdays, Fridays, Saturdays and Sundays.  5. Lasix 40 mg a day.  6. Potassium chloride 20 meq a day.  7. Enteric-coated aspirin 81 mg.  8. Amiodarone 200 mg a day.  9. Digoxin 0.125 mg a day.  10.Cardizem CD 120 mg a day.  11.Crestor 10 mg a day.  12.The patient also takes an anxiety medication which she thinks might      be lorazepam.  She is to take that as needed.   DISPOSITION:  The patient will see Dr. Swaziland in 1-2 weeks.  She will  see Dr. Tenny Craw in 1-2 weeks.   HISTORY:  [name] was admitted to the hospital with acute  respiratory failure.  She was placed on BiPAP.  Please see dictated H&P  for further details.   HOSPITAL COURSE BY PROBLEMS:  PROBLEM #1 - RESPIRATORY FAILURE:  The  patient WAS placed on BiPAP and was aggressively diuresed.  She made  great improvements and we were able to stop the BiPAP by the next  morning.  She was also treated empirically with some IV antibiotics.  She did not have any further episodes of respiratory failure.  She ruled  out  for myocardial infarction with serial CPKs.  She has a history of a  subendocardial myocardial infarction several months ago but did not have  recurrent episodes of chest pain during this admission.   The patient continues to do well and we changed her to Avelox.  She will  be discharged on the above noted medications and disposition.   PROBLEM #2 -  INTERMITTENT ATRIAL FIBRILLATION:  Stable.   PROBLEM #3 -  HISTORY OF HYPERTENSION:  Stable.   PROBLEM #4 -  HYPERCHOLESTEROLEMIA:  Stable.           ______________________________  Vesta Mixer, M.D.     PJN/MEDQ  D:  08/10/2007  T:  08/11/2007  Job:  [id number]   cc:   Miguel Aschoff, M.D.

## 2011-02-20 NOTE — Consult Note (Signed)
[name], [name] NO.:  [id number]   MEDICAL RECORD NO.:  [id number]          PATIENT TYPE:  INP   LOCATION:  2621                         FACILITY:  [facility]   PHYSICIAN:  Peter M. Swaziland, M.D.  DATE OF BIRTH:  Mar 14, 1930   DATE OF CONSULTATION:  11/17/2007  DATE OF DISCHARGE:                                 CONSULTATION   HISTORY OF PRESENT ILLNESS:  [name] is a 77 year old white female  well-known to me.  She has a history of congestive heart failure with  ejection fraction 40% by echo done in June 2008.  She presented at that  time with acute pulmonary edema requiring ventilation support.  She also  has a history of recurrent atrial fibrillation/flutter and has been on  chronic amiodarone since June 2008.  Prior to this she had been taking  Bet[place]ace.  She had a remote stent to the distal right coronary and  repeat stenting of the mid and distal right coronary in June 2008 with  Taxus stents.  The patient states she was doing well.  She went on a  trip to [place] this weekend with her husband.  On Saturday she  returned home and did some yard work but she did note some increased  shortness of breath while she was doing this work.  Sunday she awoke  with severe shortness of breath and some discomfort in her epigastric  area presented to the emergency apartment with respiratory failure.  She  denies any nausea, fever or cough.  She has had no syncope.  She denies  any tachypalpitations.   PAST MEDICAL HISTORY:  1. Congestive heart failure.  2. Coronary artery disease with prior stenting of the right coronary      artery.  3. Atrial fibrillation/flutter.  4. Hypertension.  5. Dyslipidemia.  6. Chronic Coumadin therapy.  7. History of moderate mitral insufficiency.   ALLERGIES:  She has no known allergies.   MEDICATIONS PRIOR TO ADMISSION:  1. Amiodarone 200 mg per day.  2. Lasix 40 mg per day.  3. Plavix 75 mg daily.  4. Diovan 160 mg daily.  5.  Coumadin 3 mg Monday and Wednesday, 1.5 mg the other days.  6. Potassium 20 mEq per day.  7. Aspirin 81 mg per day.  8. Digoxin 0.125 mg daily.  9. Cardizem CD 120 mg per day.  10.Crestor 10 mg per day.   SOCIAL HISTORY:  The patient lives with her husband, who has CLL.  She  has a positive smoking history.  Last smoked in July of this year.  She  has four children.   FAMILY HISTORY:  Positive for coronary disease.   PHYSICAL EXAMINATION:  Elderly white female in no distress.  Her blood pressure is 100/50, pulse is 133 and irregular, saturations  are 86% on 3 L nasal cannula.  Respiratory rate is 30.  HEENT:  Unremarkable.  She does have positive jugular venous distention.  LUNGS:  Diminished breath sounds and few rales.  CARDIAC:  Irregular rate and rhythm with a grade 2/6 systolic murmur at  the apex.  ABDOMEN:  Soft, nontender.  She has no edema.  NEUROLOGIC:  Nonfocal.   LABORATORY DATA:  ECG shows atrial fibrillation/flutter with rapid  ventricular response and a left bundle branch block.  Chest x-ray shows  asymmetric pulmonary edema, right greater than left.  BNP level is 357.  Digoxin is 0.4.  Magnesium 2.3.  Troponin went from 0.15 to 0.19 and  then back to 0.13.  CK-MBs were all negative.  LFTs were normal.  PT is  21.4 with an INR of 1.8.  White count is 11,000, hemoglobin 7.7,  hematocrit 23.9, platelets 341,000.  Sodium is 137, potassium 3.3,  chloride 104, CO2 26, BUN 11, creatinine 0.97, glucose of 98.   IMPRESSION:  1. Acute pulmonary edema.  I think this is predominantly secondary to      congestive heart failure with acute on chronic systolic      dysfunction, probably exacerbated by recurrent atrial fibrillation,      rapid ventricular response, as well as recent increased dietary      sodium intake.  Also need to consider the possibility of amiodarone      toxicity.  2. Elevated troponins.  I agree that this is due to the stress of      acute pulmonary  edema and hypoxia.  I doubt that this represents a      primary ischemic event.  3. Severe anemia.  4. Atrial fibrillation, rapid ventricular response.  5. Coronary disease status, post stenting of the right coronary      artery.  6. History of tobacco abuse.  7. Hypertension.   PLAN:  We will increase her IV diuresis to 80 mg of Lasix q.12h.  Will  hold her amiodarone for now.  We will start her on IV Cardizem for  optimal rate control and I agree with increasing her oral digoxin dose.  We will recheck an echocardiogram tomorrow when her rate control is  improved.  I agree with transfusion of packed red cells today.  We will  also check a sedimentation rate and repeat her chest x-ray in the  morning.  If her sedimentation rate is normal and chest x-ray findings  clear quickly, then it is unlikely this represents amiodarone toxicity  and we will continue with this.  If not, then we may need to continue  amiodarone and just treat her with rate control.  The patient clearly  needs further counseling on dietary restrictions.  While she does not  add salt to her food, she does not understand that other foods are  inherently salty and that she needs to avoid.   We will follow with you.           ______________________________  Peter M. [place], M.D.    PMJ/MEDQ  D:  11/17/2007  T:  11/18/2007  Job:  [id number]   cc:   Corinna L. Lendell Caprice, MD  C. Duane Lope, M.D.

## 2011-02-20 NOTE — Discharge Summary (Signed)
NAMECYLAH, FANNIN NO.:  [id number]   MEDICAL RECORD NO.:  [id number]          PATIENT TYPE:  INP   LOCATION:  4713                         FACILITY:  [facility]   PHYSICIAN:  Peter M. [place]aziland, M.D.  DATE OF BIRTH:  1929-10-13   DATE OF ADMISSION:  03/08/2008  DATE OF DISCHARGE:                               DISCHARGE SUMMARY   ADDENDUM  As the patient was prepared for discharge on March 11, 2008, she went back  into atrial fibrillation with a rapid ventricular response up to 125-  130.  She was asymptomatic with this.  We increased her metoprolol dose  to 25 mg b.i.d.  She was continued on Tikosyn at 250 mcg b.i.d.  throughout.  Her discharge was postponed.  She remained in atrial  fibrillation the next 24 hours with an improved ventricular response  with rates between 100 and 110s.  Again, she was asymptomatic.  Her ECG  remained stable.  Her other vital signs were stable.  Her oxygen  saturation was 95% and her lungs were clear.   We recommend at this point discharge to home on her medical therapy with  increased dose of metoprolol.  All her other medications were as  dictated on her discharge summary.  Her Toprol dose was increased to 25  mg b.i.d.  We will follow up in 1 week with the patient.  If she has  persistent or recurrent atrial fibrillation on this therapy, we will  need to consider potential for referral for atrial fibrillation  ablation.   DISCHARGE STATUS:  Stable.           ______________________________  Peter M. [place], M.D.     PMJ/MEDQ  D:  03/12/2008  T:  03/12/2008  Job:  [id number]   cc:   C. Duane Lope, M.D.  Bernette Redbird, M.D.

## 2011-02-20 NOTE — Consult Note (Signed)
NAMEED, RAYSON NO.:  [id number]   MEDICAL RECORD NO.:  [id number]          PATIENT TYPE:  INP   LOCATION:  2006                         FACILITY:  [facility]   PHYSICIAN:  Sheliah Plane, MD    DATE OF BIRTH:  1930-03-18   DATE OF CONSULTATION:  05/08/2008  DATE OF DISCHARGE:                                 CONSULTATION   REQUESTING PHYSICIAN:  Peter M. [place]il[place], MD   FOLLOWUP CARDIOLOGIST:  Peter M. [place], M.D.   PRIMARY CARE PHYSICIAN:  Unknown.   REASON FOR CONSULTATION:  1. Repeat episodes of flash pulmonary edema with intermittent atrial      fibrillation.  2. Coronary occlusive disease.  3. Mitral regurgitation.   HISTORY OF PRESENT ILLNESS:  The patient is a 77 year old patient with a  depressive attitude who presents with at least 7 episodes of flash  pulmonary edema, several  of these requiring intubation in the emergency  room.  Most recent on May 03, 2008, she was admitted with sudden onset  of shortness of breath and respiratory distress and was in rapid atrial  fibrillation.  In spite of medical history of allergy to AMIODARONE in  the emergency room, she was loaded with amiodarone, intubated.  She says  from previous echo is known to have at least moderate mitral  insufficiency and depressed ejection fraction of 45%.  She has known  coronary occlusive disease, having had stents placed in her right  coronary artery once in 1999 and again in June 2008 when a Taxus drug-  eluting stent was placed.   In February 2009, she had subarachnoid bleed when she had a syncopal  episode, passed out in the bathroom, on Coumadin, and was hospitalized.   In between episodes, the patient remains functional without significant  chronic heart failure symptoms.  She does have episodes of fluid  retention.  Previous cardiac history as noted above.  She has history of  myocardial infarction in the distant past, history of angioplasty with  stent placement with  a Palmaz stent in 1999 and a Taxus stent in the  right coronary artery in June 2008.  She has had no previous cardiac  surgery.  No history of hypertension or history of hyperlipidemia.  Denies diabetes.  She is a smoker and continues to smoke occasionally.  She has no previous history of stroke.  Denies claudication.  Denies  renal insufficiency.   PAST MEDICAL HISTORY:  Significant for in addition to the above noted:  1. History of anemia with chronic GI blood loss, on Coumadin, was      found to have cecal polyp which was removed and colonoscopy by Dr.      Matthias Hughs earlier in 2009.  2. Followup subarachnoid bleed while on Coumadin, Plavix, and aspirin      in February 2009.  She has had no previous surgery.  She is married      and lives with her husband who also has chronic medical conditions      including chronic lymphoma.  The patient denies any alcohol use for      at  least the last 17 years.   CURRENT MEDICATIONS:  Include:  1. Aspirin 81 mg a day.  2. Potassium 20 mEq a day.  3. Lasix 40 mg a day.  4. Crestor 40 mg a day.  5. Metoprolol 25 b.i.d.  6. Protonix 40 mg a day.  7. Coumadin 3 mg a day, currently being held.  8. Tikosyn 250 mg twice a day.  9. Altace 2.5 mg a day.  10.Ferrous sulfate 2 tablets a day.   ALLERGIES:  The patient had AMIODARONE toxicity and was stopped in  January 2009 because of decreased diffusion capacity and increased sed  rate.  She also is intolerant of DIGOXIN primarily because of low heart  rate.  She notes that in 1988, she had a bee sting and was given  Benadryl and since then has been told she was allergic to BENADRYL.   CARDIAC REVIEW OF SYSTEMS:  The patient denies chest pain.  Denies  resting shortness of breath.  Denies exertional shortness of breath with  the exception of the episodes of acute nocturnal dyspnea and shortness  of breath that have precipitated hospital admissions.  She does have a  history of syncope as noted  above, which resulted in a fall and  subarachnoid bleed.  She notes palpitations.  She denies lower extremity  edema.   GENERAL REVIEW OF SYSTEMS:  Weight has been stable.  Denies fever,  chills, or night sweat.  RESPIRATORY:  Denies hemoptysis, otherwise as  noted above.  GASTROINTESTINAL:  She had a history of GI blood loss,  found to have a cecal polyp which was removed, and she has had no  obvious blood in her stool or urine since.  PSYCHIATRIC:  History is  significant that the patient is noted to have very depressive attitude.  Denies diabetes.  Denies amaurosis or TIAs.  Denies dysuria.   On exam today, blood pressure is 121/50.  She is still in sinus rhythm.  She is 5 feet 7 inches tall, 143 pounds.  She is awake and alert and  neurologically intact and able to relate her history.  She has soft  bilateral carotid bruits.  Cardiac exam reveals no active wheezing.  On  cardiac exam, she does have 2/6 diastolic murmur heard at the apex  radiating to the axilla.  She has abdomen that is nontender without  palpable masses or tenderness.  Lower extremities, she has some  superficial bruising around the ankles and some varicosities in both  legs.  There is no pedal edema.   The patient's echocardiogram and cardiac catheterization films are  reviewed and discussed with Dr. Sw[place]iland.  She has at least moderate  mitral regurgitation on TEE.  Cardiac catheterization reveals 50-70% mid  right lesion with total occlusion distally, filling by collaterals from  the right.  Circumflex is small without significant disease, and she has  a 70% left LAD lesion, ejection fraction is 35% with global hypokinesis.  Currently, the patient is in sinus rhythm.   IMPRESSION:  The patient with multiple episodes of flash pulmonary  edema, usually associated with going into rapid atrial fibrillation with  underlying coronary artery disease and at least moderately severe mitral  regurgitation, probably made  worse with the ischemia and rapid atrial  fibrillation.  I have discussed with the patient the surgical options as  outlined and also by Dr. Sw[place]iland including coronary artery bypass  grafting, maze procedure, and mitral valve repair possibly with not  requiring replacement.  The risk of surgery including death, infection,  stroke, myocardial infarction, bleeding, and blood transfusion are all  discussed with the patient and her husband.  The patient is aware of  these risks as her sister last year had bypass surgery and required a  prolonged hospital course up to 7 weeks postop and ultimately was  discharged home to [facility] and expired there.  We will  consider keeping the patient in the hospital to avoid having to restart  her Coumadin and proceed early next week with surgery if she is  agreeable.  She is currently having diarrhea, and we will check  Clostridium difficile today before proceeding with surgery.      Sheliah Plane, MD  Electronically Signed     EG/MEDQ  D:  05/08/2008  T:  05/09/2008  Job:  [id number]   cc:   Peter M. [place]il[place], M.D.

## 2011-02-20 NOTE — H&P (Signed)
NAMEDEL, WISEMAN NO.:  [id number]   MEDICAL RECORD NO.:  [id number]          PATIENT TYPE:  INP   LOCATION:  2622                         FACILITY:  [facility]   PHYSICIAN:  Peter M. [place]iland, M.D.  DATE OF BIRTH:  10/29/1929   DATE OF ADMISSION:  01/05/2008  DATE OF DISCHARGE:                              HISTORY & PHYSICAL   HISTORY OF PRESENT ILLNESS:  Ms. Un is a 77 year old white female  who has a known history of congestive heart failure, atrial  fibrillation, iron deficiency anemia and coronary artery disease.  She  was last hospitalized on November 29, 2007, after she had experienced a  syncopal episode.  She was found to be digoxin-toxic.  She also had  amiodarone toxicity with pulmonary infiltrates.  She suffered a  subarachnoid hemorrhage related to her fall and her Coumadin and aspirin  and Plavix were held at that time.  She did have heme-positive stools  during that admission and had iron-deficiency anemia.  Subsequent to  that she had been in sinus rhythm.  She was off all antiarrhythmic drug  therapy.  She was on a baseline dose of Lasix.  Her Coumadin and Plavix  were still on hold, although she was taking a baby aspirin per day.  She  actually felt much better.  She underwent GI evaluation with Dr. Matthias Hughs  on March 26.  Her upper endoscopy was unremarkable.  Her colonoscopy  revealed a moderate-sized polyp up in the cecum that was removed and  clipped.  She had three smaller polyps.  Pathology is pending at this  time.  The patient presented to our office today as a work-in.  She  noticed acute onset of some upper abdominal fullness and states she fell  puffy in her upper abdomen.  She had no nausea, vomiting or diarrhea.  Bowel movements have been normal.  She has had no chest pain and she  denies shortness of breath but appeared to be visibly in some  respiratory distress.  Her husband noted that she was much weaker this  morning.  On our  evaluation she was found to be in atrial fibrillation  with a rapid ventricular response at a rate of 137.  She was also  hypoxic with a saturation of 88% on room air.  The patient is admitted  at this time for treatment of congestive heart failure and rapid atrial  fibrillation.   PAST MEDICAL HISTORY:  1. Ischemic cardiomyopathy, baseline ejection fraction is 45-50%.  She      has inferior wall akinesia.  2. History of coronary artery disease.  She has had previous stenting      of the right coronary artery in 1996 and again in 2008.  3. History of atrial fibrillation, paroxysmal.  4. Hyperlipidemia.  5. Hypertension.  6. Iron-deficiency anemia.  7. History of digoxin toxicity.  8. History of amiodarone pulmonary toxicity.  9. Status post removal of colon and cecal polyps.  10.A history of left bundle branch block.   CURRENT MEDICATIONS:  1. Crestor 10 mg per day.  2. Potassium 20 mEq per  day.  3. Aspirin 81 mg per day.  4. Lasix 40 mg twice a day.  5. Florastor 250 mg b.i.d.  6. Protonix 40 mg per day.  7. Ambien 5 mg nightly.  8. Of note, her iron was held after her colonoscopy procedure last      week.   SOCIAL HISTORY:  She lives in [place] with her husband.  She has  children.  She denies tobacco or alcohol use.   FAMILY HISTORY:  Mother has a history of cancer and father has a history  of coronary disease.   REVIEW OF SYSTEMS:  She denies any increased edema or orthopnea.  She  denies any significant chest pain.  She has had no new neurologic  symptoms.  She denies any headache.  Other review of systems is  negative.   PHYSICAL EXAM:  The patient is a pleasant, elderly white female who  appears in mild respiratory distress.  Her weight is 145, blood pressure is 124/70, pulse is 137 and irregular,  respirations are 24.  HEENT:  She wears glasses.  Her pupils are equal, round, reactive to  light and accommodation.  Extraocular movements are full.  Oropharynx  is  clear.  NECK:  Supple without JVD, adenopathy, thyromegaly or bruits.  LUNGS:  Diminished breath sounds in the bases with mild crackles.  CARDIAC:  A rapid rate, irregular rhythm, with a grade 2/6 systolic  murmur at the apex.  ABDOMEN:  Soft, nontender, without masses or splenomegaly.  Femoral and pedal pulses are 2+ and symmetric.  NEUROLOGIC:  Nonfocal.   LABORATORY DATA:  ECG shows atrial fibrillation with a rapid ventricular  response and a left bundle branch block.   IMPRESSION:  1. Recurrent atrial fibrillation with a rapid ventricular responsive.  2. Congestive heart failure due to mild left ventricular systolic      dysfunction, diastolic dysfunction, and rapid atrial fibrillation.  3. Status post removal of a moderate-sized cecal polyp 5 days ago.  4. Coronary artery disease with prior stenting of the right coronary      artery.  5. History of subarachnoid hemorrhage 5 weeks ago.  6. History of amiodarone pulmonary toxicity.  7. Hyperlipidemia.  8. Hypertension.  9. History of iron-deficiency anemia, most recent hemoglobin of 12.3      on March 26.   PLAN:  The patient will be admitted to a step-down unit.  She will be  begun on rate control with IV Cardizem.  Would avoid digoxin.  She has  been off amiodarone for 5 weeks now.  Would check an amiodarone level.  If her level is appropriately low, then we could initiate alternative  antiarrhythmic drug therapy for atrial fibrillation.  At  this point given her prior subarachnoid hemorrhage and her recent  colonoscopy with removal of a moderate-sized cecal polyp, we would like  to hold off on her Coumadin for at least another week.  She will not be  anticoagulated with either Lovenox or heparin due to these risks.           ______________________________  Peter M. [place], M.D.     PMJ/MEDQ  D:  01/05/2008  T:  01/05/2008  Job:  [id number]   cc:   Duane Lope, MD  Bernette Redbird, M.D.

## 2011-02-20 NOTE — H&P (Signed)
NAMELESSLY, STIGLER NO.:  [id number]   MEDICAL RECORD NO.:  [id number]          PATIENT TYPE:  INP   LOCATION:  2928                         FACILITY:  [facility]   PHYSICIAN:  Vesta Mixer, M.D. DATE OF BIRTH:  1930/05/14   DATE OF ADMISSION:  08/08/2007  DATE OF DISCHARGE:                              HISTORY & PHYSICAL   [name] is a 77 year old female with a history of coronary artery  disease, atrial fibrillation and COPD.  She is admitted with respiratory  failure.   The patient was admitted in June 2008 with respiratory failure.  She  also had a rapid atrial fibrillation.  She ruled in for myocardial  infarction and was ultimately treated with PTCA and stenting of the  right coronary artery.  She was diuresed and did fairly well.  She  converted to sinus rhythm.  She has done fairly well since that time.   Last night she started having episodes of indigestion.  The pain lasted  all night and she gradually became more and more short of breath through  the night.  This morning she woke up very short of breath and presented  to the emergency room.  She called EMS and they gave her Lasix, morphine  and nitroglycerin, and she felt little bit better.   CURRENT MEDICATIONS:  1. Crestor 10 mg a day.  2. Coumadin 2.5 mg five days a week with 5 mg two days a week.  3. Potassium chloride 20 mEq a day  4. Diovan 320 mg a day.  5. Aspirin 325 mg a day.  6. Plavix 75 mg a day.  7. Lanoxin 0.25 mg a day.  8. Lasix 40 mg a day.  9. Amiodarone 200 mg a day.  10.Diltiazem slow-release 120 mg a day.   ALLERGIES:  She is intolerant to BENADRYL which causes hives.   PAST MEDICAL HISTORY:  1. History of atrial fibrillation.  2. History of coronary artery disease - status post an old inferior      wall myocardial infarction and a recent PTCA and stenting of her      right coronary artery.  3. COPD.  4. Hypertension.  5. History of moderate mitral  regurgitation.  6. Hypercholesterolemia.   SOCIAL HISTORY:  The patient continues to smoke.   FAMILY HISTORY:  Noncontributory.   REVIEW OF SYSTEMS:  Reviewed and is essentially negative except as noted  in the HPI.   EXAMINATION:  She is an elderly female in moderate distress.  Her heart  rate is 124, blood pressure is 106/62.  HEENT EXAM:  Reveals no JVD.  She was examined with the BiPAP mask on so  it was fairly difficult.  LUNG EXAM:  Reveals bilateral rales right greater than left.  She has  bilateral wheezes.  HEART:  Irregularly irregular and is tachycardic.  ABDOMINAL EXAM:  Reveals good bowel sounds and is nontender.  EXTREMITIES:  She has no clubbing, cyanosis or edema.  NEUROLOGIC EXAM:  Nonfocal.   LABORATORY DATA:  Her white blood cell count is 24.3, hemoglobin is  15.6, hematocrit is 47.2.  Arterial blood gases show a pH of 7.41, pCO2  of 32, pO2 of 41 on room air.  Sodium is 138, potassium is 4.1, chloride  is 106, CO2 is 21, BUN is 17, creatinine is 0.9.  BNP is 399.   Her chest x-ray reveals bilateral pulmonary edema, right greater than  left.   Her EKG reveals rapid atrial fibrillation.  She has a left bundle-branch  block with associated ST- and T-wave changes but no changes from her  previous EKG.   IMPRESSION AND PLAN:  Respiratory failure.  The etiology is most likely  multifactorial.  She clearly has severe COPD and an elevated white  count.  This could be an exacerbation of her COPD with bronchitis.  In  addition, she also has rapid atrial fibrillation and could have  diastolic dysfunction.  Her enzymes are negative despite having  discomfort for the past 8-12 hours.  She certainly could have ischemia  and worsening congestive heart failure.  We have consulted the [facility] to help [place] with evaluation and management of her diabetes  and respiratory failure.  We will continue treatment of her atrial  fibrillation.  She did not tolerate  Cardizem in the past and so I do not  think she will tolerate it this time.  We will continue with amiodarone.  Hopefully, she will slow down as she diureses and her respiratory status  improves.           ______________________________  Vesta Mixer, M.D.     PJN/MEDQ  D:  08/08/2007  T:  08/09/2007  Job:  [id number]   cc:   C. Duane Lope, M.D.  Peter M. [place], M.D.

## 2011-02-20 NOTE — Discharge Summary (Signed)
NAMEARAH, [name]             ACCOUNT NO.:  [id number]   MEDICAL RECORD NO.:  [id number]          PATIENT TYPE:  INP   LOCATION:  6733                         FACILITY:  [facility]   PHYSICIAN:  Kela Millin, M.D.DATE OF BIRTH:  [birth year]   DATE OF ADMISSION:  11/16/2007  DATE OF DISCHARGE:  11/20/2007                               DISCHARGE SUMMARY   DISCHARGE DIAGNOSES:  1. Acute respiratory failure, hypoxic, secondary to congestive heart      failure exacerbation with pneumonia and possible amiodarone      pulmonary toxicity.  2. Acute on chronic systolic congestive heart failure.  3. Elevated troponins, due to stress of acute pulmonary edema and      hypoxia, not primary ischemic event per cardiology.  4. Atrial fibrillation with rapid ventricular response.  5. Iron-deficiency anemia, severe.  6. Coronary artery disease, status post stenting of right coronary      artery.  7. Hypertension.  8. History of tobacco abuse.  9. Hypokalemia, resolved.  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   PROCEDURES AND STUDIES:  1. A 2D echocardiogram, overall left ventricular systolic function      mildly decreased, ejection fraction 45% to 50% with severe      hypokinesis of the inferior wall.  Findings consistent with mild      aortic valve stenosis.  Moderate mitral valvular regurgitation.  2. Pulmonary function test, on November 20, 2007, DLCO, 8.27, 36% of      predicted.  Reduced diffusion capacity indicating severe loss of      functional alveolar capillary surface.   CONSULTATIONS:  Cardiology, Dr. Swaziland.   BRIEF HISTORY:  The patient is a 77 year old white female with above-  listed medical problems, as well as a history of respiratory failure in  the past requiring BiPAP who presented with complaints of shortness of  breath.  The ER physician reported that her O2 sats were in the 50s at  home and upon arrival to the ER she was put on BiPAP and her O2 sats  improved  to 100%.  It was reported that she had had a recent sinus  infection and was treated with an antibiotic and also that she had had  generalized malaise and fatigue for a week prior to admission.   Please see the full admission history and physical dictated on November 16, 2007, by Dr. Lendell Caprice for the details of the admission physical exam,  as well as the laboratory data.   HOSPITAL COURSE:  1. Acute respiratory failure, as discussed above, upon admission      patient was placed on BiPAP with improvement of her oxygenation.      She was diuresed with IV Lasix as well.  She had cardiac enzymes      done and these were elevated but the impression was that it was      likely secondary to her hypoxia/pulmonary edema and not due to a      primary ischemic event.  Her chest x-ray showed bilateral      asymmetric edema or infiltrates probably superimposed on a degree  of chronic underlying pulmonary parenchymal disease, and her white      cell count was also elevated at 15,000 and so she was empirically      started on broad-spectrum IV antibiotics to cover for pneumonia.      As already mentioned, cardiology was consulted and Dr. Swaziland saw      the patient and noted that she had been on amiodarone and suspected      possible amiodarone toxicity and so the amiodarone was held.  Dr.      Swaziland also increased her digoxin and with the diuresis she      improved and was weaned off the BiPAP.  A 2D echocardiogram was      done and the results as stated above.  As patient continued to      improve, her IV Lasix was subsequently changed by cardiology to      p.o. and she was maintained on an increased dose of p.o. Lasix.      She has been oxygenating well on room air, cardiac rehab was      consulted and has been following the patient and she has been able      to ambulate without difficulty.  Pulmonary function tests were      ordered to further evaluate for the possibility of amiodarone       toxicity and the results were discussed with Dr. Swaziland, the      reduced diffusion capacity which is supportive of possible      amiodarone toxicity.  She is to stay off amiodarone.  She has      remained hemodynamically stable and tolerating p.o. well and will      be discharged as recommended per cardiology to follow up with Dr.      Swaziland in 2 weeks.  Given that her Cardizem and Lasix doses were      increased this hospitalization, her Diovan has been on hold and Dr.      Swaziland has recommended that she stays off of it until she follows      up with him at the office.  2. Probable pneumonia, as discussed above, she has been on antibiotics      and her leukocytosis as resolved, white cell count of 8.8 today      prior to discharge and she has been afebrile and hemodynamically      stable.  She will be discharged on oral antibiotics to complete the      antibiotic course.  3. Atrial fibrillation with rapid ventricular response, she was placed      on IV Cardizem following admission per cardiology and once her      heart rate was controlled was subsequently switched back to oral      Cardizem at an increased dose.  As discussed above, amiodarone was      discontinued secondary to the possibility of amiodarone toxicity as      discussed above.  Her digoxin dose was also increased and she is to      continue this upon discharge.  Patient was maintained on Coumadin      and she is to follow up for outpatient PT/INR monitoring as      previously.  4. Severe iron-deficiency anemia, her hemoglobin dropped to a low of      7.7 during hospital stay and she required blood transfusion.      Anemia workup was done which revealed iron level  of 10 with a TIBC      of 264 and ferritin of 36.  Stool Hemoccults were done and these      were negative.  The patient did not have any evidence of GI      bleeding in the hospital, she reported that she had had recurrent      epistaxis.  Following her  transfusions, hemoglobin has been stable,      10.9 today prior to discharge.  She is to follow up with      gastroenterology upon discharge for possible colonoscopy as she      does not remember having had any colonoscopies.  She will be      discharged on iron supplementation.  5. Hypokalemia, her potassium was replaced during her hospital stay.  6. Dyslipidemia, patient is to continue her outpatient medications      upon discharge.   DISCHARGE MEDICATIONS:  1. Digoxin 0.25 mg p.o. daily.  2. Cardizem 240 mg p.o. daily.  3. Lasix 80 mg p.o. b.i.d.  4. Plavix 75 mg p.o. daily.  5. Coumadin 3 mg and as directed upon outpatient followup.  6. Kay Ciel 40 mEq p.o. b.i.d.  7. Aspirin 81 mg p.o. daily.  8. Crestor 10 mg p.o. daily.  9. Ativan p.r.n. as previously.  10.Iron sulfate 1 p.o. b.i.d.  11.Senokot 2 p.o. q.h.s. p.r.n. constipation only.  12.Florastor 250 mg p.o. b.i.d.  13.Stop Diovan.  She will follow up with Dr. Swaziland as above.   FOLLOWUP CARE:  1. She is to follow up at the lab at Dr. Elvis Coil office as scheduled      for PT/INR.  2. Appointment with Dr. Swaziland as scheduled in 2 weeks.  3. Dr. Miguel Aschoff in 1 to 2 weeks.  4. [facility], patient to call for followup appointment.   DISCHARGE CONDITION:  Improved/stable.      Kela Millin, M.D.  Electronically Signed     ACV/MEDQ  D:  11/20/2007  T:  11/21/2007  Job:  [id number]   cc:   C. Duane Lope, M.D.  Peter M. Swaziland, M.D.  [facility]

## 2011-02-21 ENCOUNTER — Encounter (HOSPITAL_COMMUNITY): Payer: Self-pay

## 2011-02-21 ENCOUNTER — Ambulatory Visit (INDEPENDENT_AMBULATORY_CARE_PROVIDER_SITE_OTHER): Payer: Medicare Other | Admitting: *Deleted

## 2011-02-21 ENCOUNTER — Encounter: Payer: Medicare Other | Admitting: *Deleted

## 2011-02-21 DIAGNOSIS — I4891 Unspecified atrial fibrillation: Secondary | ICD-10-CM

## 2011-02-21 DIAGNOSIS — Z7901 Long term (current) use of anticoagulants: Secondary | ICD-10-CM

## 2011-02-21 LAB — POCT INR: INR: 2.5

## 2011-02-23 ENCOUNTER — Encounter (HOSPITAL_COMMUNITY): Payer: Self-pay

## 2011-02-23 NOTE — Discharge Summary (Signed)
[name], [name]             ACCOUNT NO.:  [id number]   MEDICAL RECORD NO.:  [id number]          PATIENT TYPE:  INP   LOCATION:  2019                         FACILITY:  [facility]   PHYSICIAN:  Sheliah Plane, MD    DATE OF BIRTH:  1930/09/15   DATE OF ADMISSION:  05/03/2008  DATE OF DISCHARGE:  05/30/2008                               DISCHARGE SUMMARY   ADMITTING DIAGNOSES:  1. History of coronary artery disease (status post percutaneous      transluminal coronary angioplasty with stent in 1996, followed by      percutaneous transluminal coronary angioplasty with drug-eluting      stent in 2008).  2. History of congestive heart failure.  3. History of chronic atrial fibrillation.  4. Severe mitral regurgitation.  5. Multiple episodes of pulmonary edema.  6. History of hyperlipidemia.  7. History of hypertension.  8. History of iron-deficiency anemia.  9. History of subarachnoid hemorrhage.   DISCHARGE DIAGNOSES:  1. History of coronary artery disease (status post percutaneous      transluminal coronary angioplasty with stent in 1996 followed by      percutaneous transluminal coronary angioplasty with drug-eluting      stent in 2008).  2. History of congestive heart failure.  3. History of chronic atrial fibrillation.  4. Severe mitral regurgitation.  5. Multiple episodes of pulmonary edema.  6. History of hyperlipidemia.  7. History of hypertension.  8. History of iron-deficiency anemia.  9. History of subarachnoid hemorrhage.  10.Tachy-brady syndrome.  11.Urinary tract infection.   PROCEDURES:  1. A 2-D echo on May 03, 2008, which showed a left ventricular      ejection fraction of 30% to 35%, diffuse left ventricular      hypokinesis, akinesis of the inferior wall, aortic valve thickness      was moderately increased from previous echo, aortic valve was      mildly calcified, moderate mitral annular calcification, and      moderate mitral valvular regurgitation  (3+ in a scale of 0 to 4).  2. Coronary artery bypass grafting surgery x2 (left internal mammary      artery to left anterior descending and saphenous vein graft to      posterior descending right coronary artery, mitral valve repair      with an [employer] annuloplasty ring (26 mm), right and      left-sided Maze procedure with endoscopic vein harvest of the right      lower extremity by Dr. Tyrone Sage on May 11, 2008.  3. Cardiac catheterization performed on May 07, 2008, by Dr. Swaziland      (ejection fraction of 35%), global hypokinesis and inferior      akinesis, moderate-to-severe mitral regurgitation, 70% proximal      lesion in the left anterior descending, 50% mid lesion in the right      coronary artery, and 100% distal lesion of the right coronary      artery.  The patient was stabilized and then underwent the      aforementioned coronary bypass grafting x2, mitral valve repair,  right and left-sided knees by Dr. Tyrone Sage on May 11, 2008.   HISTORY OF PRESENT ILLNESS:  This is a 77 year old Caucasian female,  patient of Dr. Swaziland, with a history of coronary artery disease (status  post PTCA with stents x2 to the RCA), CHF, chronic atrial fibrillation,  severe MR, hyperlipidemia, and hypertension, who had several admissions  prior to this one, acute exacerbation of rapid atrial fibrillation, CHF  resulting in acute intubation because of pulmonary collapse.  Upon this  admission, the patient originally presented with pulmonary edema and  respiratory failure and was intubated.  As previously stated,  echocardiogram was performed, which showed severe mitral regurg.  As  previously stated, the patient had a history of coronary artery disease  with intervention.  She had a repeat cardiac catheterization done on  May 07, 2008, which showed depressed left ventricular function (EF of  35%), moderate-to-severe MR, total occlusion of the distal RCA, 70%  lesion of the  LAD.   HOSPITAL COURSE STAY:  The patient was extubated late the evening of  surgery.  Chest tubes were removed earlier in the postoperative course,  followup chest x-ray revealed small left apical pneumothorax (less than  5%).  This did later resolve.  The patient remained AV paced (underlying  rate as low as in the 40s).  She was weaned off her drips as tolerated.  She was initially in sinus rhythm and later went to atrial fibrillation.  Coumadin was begun.  The patient had also been on Tikosyn  preoperatively.  This was restarted as well.  The patient then developed  long pauses and remained in atrial fibrillation.  Heart rate was  increased to 120s to 130s upon ambulation.  The patient developed tachy-  brady syndrome.  Coumadin was held in anticipation of pacemaker  placement.  The patient was also started on esmolol drip.  In addition,  the patient had a low affect pre and postop, and was placed on Lexapro  as well.  Digoxin was also initiated to help with rate control.  The  patient was weaned off esmolol drip and Lopressor was initiated.  Her  rhythm was AFib and AFlutter with rapid ventricular rate.  Medications  were adjusted accordingly.  The patient was volume overloaded and  diuresed.  She was also found to be hypokalemic secondary to diuresis as  was replaced accordingly.  She was also found to be anemic  postoperatively.  She did not require transfusion and gradually her H&H  continued to improve over her hospital course stay.  If not stated  previously, Coumadin was resumed for atrial fibrillation/flutter after  having had a pacemaker placed.  The patient's Foley was removed on  May 23, 2008.  A UA was checked and showed a large amount of  leukocytes.  Urine culture was positive for greater than 100,000 of  Enterobacter species.  She was placed on Cipro accordingly.   The patient was eventually transferred from the Intensive Care Unit to  2000 for further  convalescence.  On postop day 13, she remained in  atrial fibrillation with occasional rapid ventricular rate.  Pacing  wires were removed on May 26, 2008.  The patient continued to slowly  progress with cardiac rehab.  Following, the patient was ready for  discharge on May 30, 2008.  On this day, she was afebrile.  Heart  rate was 110 to 120s after ambulating, otherwise in the 80s.  Systolic  blood pressure on average was high 90s  to low 100s over 70s to 80s.  O2  sat was 93% to 96% on room air.  Preop weight was 64.5 kg, today's  weight was 65.3 kg.  CBG was 201, 110, and 97 respectively.   PHYSICAL EXAMINATION:  CARDIOVASCULAR:  Irregular rate and rhythm.  PULMONARY:  Decreased at the bases.  EXTREMITIES:  No edema.  Sternal and right lower extremity wounds clean  and dry.   LATEST LABORATORY STUDIES:  Dig level drawn May 30, 2008, was 1.3.  with BMET also done on this day, potassium 4.3, BUN and creatinine 10  and 0.67 respectively.  PT and INR drawn on this day 20 and 2.4  respectively.  CBC also drawn on this date, H&H 11.2 and 34.3  respectively.  White blood cell count 9800 and platelet count 396,000.  I reviewed her chest x-ray done on May 30, 2008, showed moderate  bilateral pleural effusions, right greater than left with associated  atelectasis or consolidation similar to previously mild central  pulmonary vascular congestion.   DISCHARGE INSTRUCTIONS:  The patient is not to drive or lift more than  10 pounds.  She is to continue with breathing exercise daily.  She is to  walk every day and increase her frequency and duration as tolerated.  She is to remain on low-fat, low-salt diet.  She may shower.  She is to  clean the wounds with mild soap and water.   FOLLOWUP APPOINTMENTS:  1. A PT/INR to be drawn on May 31, 2008, by the home health care      nurse, results are re-faxed to Dr. Elvis Coil office.  2. The patient is to contact Dr. Elvis Coil office for  followup      appointment in 2 weeks.  A dig level also have to be checked at his      discretion.  3. The patient needs to call Dr. Dennie Maizes office to have a follow up      arranged for 3 weeks prior to this followup appointment, a chest x-      ray will be obtained.   DISCHARGE MEDICATIONS:  1. EC ASA 81 mg p.o. daily.  2. Crestor 40 mg p.o. at bedtime.  3. Protonix 40 mg p.o. daily.  4. Tikosyn 250 mcg p.o. twice daily.  5. Ferrous sulfate 2 tablets p.o. daily.  6. Lasix 20 mg p.o. daily.  7. Potassium chloride 10 mEq p.o. daily.  8. Lopressor 12.5 mg p.o. twice daily.  9. Coumadin 1 mg p.o. daily or as directed.  10.Lexapro 10 mg p.o. daily.  11.Digoxin 0.125 mg p.o. daily.  12.Aldactone 25 mg p.o. daily.  13.Cipro 500 mg p.o. daily x2 days.  14.Ultram 50 mg 1 or 2 tablets q.4-6 h. as needed for pain.       Doree Fudge, [place]      Sheliah Plane, MD  Electronically Signed    DZ/MEDQ  D:  06/01/2008  T:  06/02/2008  Job:  [id number]   cc:   Peter M. Swaziland, M.D.

## 2011-02-26 ENCOUNTER — Encounter (HOSPITAL_COMMUNITY): Payer: Self-pay

## 2011-02-28 ENCOUNTER — Encounter (HOSPITAL_COMMUNITY): Payer: Self-pay

## 2011-03-02 ENCOUNTER — Encounter (HOSPITAL_COMMUNITY): Payer: Self-pay

## 2011-03-05 ENCOUNTER — Encounter (HOSPITAL_COMMUNITY): Payer: Self-pay

## 2011-03-07 ENCOUNTER — Encounter (HOSPITAL_COMMUNITY): Payer: Self-pay

## 2011-03-09 ENCOUNTER — Encounter (HOSPITAL_COMMUNITY): Payer: Self-pay | Attending: Cardiology

## 2011-03-09 DIAGNOSIS — I2789 Other specified pulmonary heart diseases: Secondary | ICD-10-CM | POA: Insufficient documentation

## 2011-03-09 DIAGNOSIS — R092 Respiratory arrest: Secondary | ICD-10-CM | POA: Insufficient documentation

## 2011-03-09 DIAGNOSIS — Z950 Presence of cardiac pacemaker: Secondary | ICD-10-CM | POA: Insufficient documentation

## 2011-03-09 DIAGNOSIS — Z9861 Coronary angioplasty status: Secondary | ICD-10-CM | POA: Insufficient documentation

## 2011-03-09 DIAGNOSIS — Z8674 Personal history of sudden cardiac arrest: Secondary | ICD-10-CM | POA: Insufficient documentation

## 2011-03-09 DIAGNOSIS — Z5189 Encounter for other specified aftercare: Secondary | ICD-10-CM | POA: Insufficient documentation

## 2011-03-09 DIAGNOSIS — Z7901 Long term (current) use of anticoagulants: Secondary | ICD-10-CM | POA: Insufficient documentation

## 2011-03-09 DIAGNOSIS — I472 Ventricular tachycardia, unspecified: Secondary | ICD-10-CM | POA: Insufficient documentation

## 2011-03-09 DIAGNOSIS — I4729 Other ventricular tachycardia: Secondary | ICD-10-CM | POA: Insufficient documentation

## 2011-03-09 DIAGNOSIS — F3289 Other specified depressive episodes: Secondary | ICD-10-CM | POA: Insufficient documentation

## 2011-03-09 DIAGNOSIS — F329 Major depressive disorder, single episode, unspecified: Secondary | ICD-10-CM | POA: Insufficient documentation

## 2011-03-09 DIAGNOSIS — I2510 Atherosclerotic heart disease of native coronary artery without angina pectoris: Secondary | ICD-10-CM | POA: Insufficient documentation

## 2011-03-09 DIAGNOSIS — I059 Rheumatic mitral valve disease, unspecified: Secondary | ICD-10-CM | POA: Insufficient documentation

## 2011-03-09 DIAGNOSIS — I509 Heart failure, unspecified: Secondary | ICD-10-CM | POA: Insufficient documentation

## 2011-03-09 DIAGNOSIS — I252 Old myocardial infarction: Secondary | ICD-10-CM | POA: Insufficient documentation

## 2011-03-09 DIAGNOSIS — I501 Left ventricular failure: Secondary | ICD-10-CM | POA: Insufficient documentation

## 2011-03-09 DIAGNOSIS — D509 Iron deficiency anemia, unspecified: Secondary | ICD-10-CM | POA: Insufficient documentation

## 2011-03-09 DIAGNOSIS — I959 Hypotension, unspecified: Secondary | ICD-10-CM | POA: Insufficient documentation

## 2011-03-09 DIAGNOSIS — D890 Polyclonal hypergammaglobulinemia: Secondary | ICD-10-CM | POA: Insufficient documentation

## 2011-03-09 DIAGNOSIS — Z9889 Other specified postprocedural states: Secondary | ICD-10-CM | POA: Insufficient documentation

## 2011-03-09 DIAGNOSIS — E785 Hyperlipidemia, unspecified: Secondary | ICD-10-CM | POA: Insufficient documentation

## 2011-03-09 DIAGNOSIS — I447 Left bundle-branch block, unspecified: Secondary | ICD-10-CM | POA: Insufficient documentation

## 2011-03-09 DIAGNOSIS — Z7982 Long term (current) use of aspirin: Secondary | ICD-10-CM | POA: Insufficient documentation

## 2011-03-09 DIAGNOSIS — I4891 Unspecified atrial fibrillation: Secondary | ICD-10-CM | POA: Insufficient documentation

## 2011-03-09 DIAGNOSIS — Z951 Presence of aortocoronary bypass graft: Secondary | ICD-10-CM | POA: Insufficient documentation

## 2011-03-09 DIAGNOSIS — I2589 Other forms of chronic ischemic heart disease: Secondary | ICD-10-CM | POA: Insufficient documentation

## 2011-03-12 ENCOUNTER — Encounter (HOSPITAL_COMMUNITY): Payer: Self-pay

## 2011-03-14 ENCOUNTER — Encounter (HOSPITAL_COMMUNITY): Payer: Self-pay

## 2011-03-16 ENCOUNTER — Encounter (HOSPITAL_COMMUNITY): Payer: Self-pay

## 2011-03-19 ENCOUNTER — Encounter (HOSPITAL_COMMUNITY): Payer: Self-pay

## 2011-03-21 ENCOUNTER — Ambulatory Visit (INDEPENDENT_AMBULATORY_CARE_PROVIDER_SITE_OTHER): Payer: Medicare Other | Admitting: *Deleted

## 2011-03-21 ENCOUNTER — Encounter (HOSPITAL_COMMUNITY): Payer: Self-pay

## 2011-03-21 DIAGNOSIS — I4891 Unspecified atrial fibrillation: Secondary | ICD-10-CM

## 2011-03-21 DIAGNOSIS — Z7901 Long term (current) use of anticoagulants: Secondary | ICD-10-CM

## 2011-03-21 LAB — POCT INR: INR: 2.5

## 2011-03-23 ENCOUNTER — Encounter (HOSPITAL_COMMUNITY): Payer: Self-pay

## 2011-03-26 ENCOUNTER — Other Ambulatory Visit: Payer: Self-pay | Admitting: Cardiology

## 2011-03-26 ENCOUNTER — Encounter (HOSPITAL_COMMUNITY): Payer: Self-pay

## 2011-03-27 NOTE — Telephone Encounter (Signed)
Med refill

## 2011-03-28 ENCOUNTER — Encounter (HOSPITAL_COMMUNITY): Payer: Self-pay

## 2011-03-30 ENCOUNTER — Encounter (HOSPITAL_COMMUNITY): Payer: Self-pay

## 2011-04-02 ENCOUNTER — Encounter (HOSPITAL_COMMUNITY): Payer: Self-pay

## 2011-04-04 ENCOUNTER — Encounter (HOSPITAL_COMMUNITY): Payer: Self-pay

## 2011-04-06 ENCOUNTER — Encounter (HOSPITAL_COMMUNITY): Payer: Self-pay

## 2011-04-09 ENCOUNTER — Encounter (HOSPITAL_COMMUNITY): Payer: Self-pay | Attending: Cardiology

## 2011-04-09 DIAGNOSIS — I472 Ventricular tachycardia, unspecified: Secondary | ICD-10-CM | POA: Insufficient documentation

## 2011-04-09 DIAGNOSIS — D890 Polyclonal hypergammaglobulinemia: Secondary | ICD-10-CM | POA: Insufficient documentation

## 2011-04-09 DIAGNOSIS — Z951 Presence of aortocoronary bypass graft: Secondary | ICD-10-CM | POA: Insufficient documentation

## 2011-04-09 DIAGNOSIS — F3289 Other specified depressive episodes: Secondary | ICD-10-CM | POA: Insufficient documentation

## 2011-04-09 DIAGNOSIS — I447 Left bundle-branch block, unspecified: Secondary | ICD-10-CM | POA: Insufficient documentation

## 2011-04-09 DIAGNOSIS — Z9861 Coronary angioplasty status: Secondary | ICD-10-CM | POA: Insufficient documentation

## 2011-04-09 DIAGNOSIS — Z8674 Personal history of sudden cardiac arrest: Secondary | ICD-10-CM | POA: Insufficient documentation

## 2011-04-09 DIAGNOSIS — I509 Heart failure, unspecified: Secondary | ICD-10-CM | POA: Insufficient documentation

## 2011-04-09 DIAGNOSIS — I2510 Atherosclerotic heart disease of native coronary artery without angina pectoris: Secondary | ICD-10-CM | POA: Insufficient documentation

## 2011-04-09 DIAGNOSIS — D509 Iron deficiency anemia, unspecified: Secondary | ICD-10-CM | POA: Insufficient documentation

## 2011-04-09 DIAGNOSIS — I2789 Other specified pulmonary heart diseases: Secondary | ICD-10-CM | POA: Insufficient documentation

## 2011-04-09 DIAGNOSIS — F329 Major depressive disorder, single episode, unspecified: Secondary | ICD-10-CM | POA: Insufficient documentation

## 2011-04-09 DIAGNOSIS — Z7901 Long term (current) use of anticoagulants: Secondary | ICD-10-CM | POA: Insufficient documentation

## 2011-04-09 DIAGNOSIS — I059 Rheumatic mitral valve disease, unspecified: Secondary | ICD-10-CM | POA: Insufficient documentation

## 2011-04-09 DIAGNOSIS — I4891 Unspecified atrial fibrillation: Secondary | ICD-10-CM | POA: Insufficient documentation

## 2011-04-09 DIAGNOSIS — Z950 Presence of cardiac pacemaker: Secondary | ICD-10-CM | POA: Insufficient documentation

## 2011-04-09 DIAGNOSIS — I501 Left ventricular failure: Secondary | ICD-10-CM | POA: Insufficient documentation

## 2011-04-09 DIAGNOSIS — I4729 Other ventricular tachycardia: Secondary | ICD-10-CM | POA: Insufficient documentation

## 2011-04-09 DIAGNOSIS — Z7982 Long term (current) use of aspirin: Secondary | ICD-10-CM | POA: Insufficient documentation

## 2011-04-09 DIAGNOSIS — I959 Hypotension, unspecified: Secondary | ICD-10-CM | POA: Insufficient documentation

## 2011-04-09 DIAGNOSIS — I2589 Other forms of chronic ischemic heart disease: Secondary | ICD-10-CM | POA: Insufficient documentation

## 2011-04-09 DIAGNOSIS — Z5189 Encounter for other specified aftercare: Secondary | ICD-10-CM | POA: Insufficient documentation

## 2011-04-09 DIAGNOSIS — E785 Hyperlipidemia, unspecified: Secondary | ICD-10-CM | POA: Insufficient documentation

## 2011-04-09 DIAGNOSIS — R092 Respiratory arrest: Secondary | ICD-10-CM | POA: Insufficient documentation

## 2011-04-09 DIAGNOSIS — I252 Old myocardial infarction: Secondary | ICD-10-CM | POA: Insufficient documentation

## 2011-04-09 DIAGNOSIS — Z9889 Other specified postprocedural states: Secondary | ICD-10-CM | POA: Insufficient documentation

## 2011-04-11 ENCOUNTER — Encounter (HOSPITAL_COMMUNITY): Payer: Self-pay

## 2011-04-13 ENCOUNTER — Encounter (HOSPITAL_COMMUNITY): Payer: Self-pay

## 2011-04-16 ENCOUNTER — Encounter (HOSPITAL_COMMUNITY): Payer: Self-pay

## 2011-04-18 ENCOUNTER — Ambulatory Visit (INDEPENDENT_AMBULATORY_CARE_PROVIDER_SITE_OTHER): Payer: Medicare Other | Admitting: *Deleted

## 2011-04-18 ENCOUNTER — Encounter (HOSPITAL_COMMUNITY): Payer: Self-pay

## 2011-04-18 ENCOUNTER — Other Ambulatory Visit: Payer: Self-pay | Admitting: *Deleted

## 2011-04-18 DIAGNOSIS — I4891 Unspecified atrial fibrillation: Secondary | ICD-10-CM

## 2011-04-18 DIAGNOSIS — Z7901 Long term (current) use of anticoagulants: Secondary | ICD-10-CM

## 2011-04-20 ENCOUNTER — Encounter (HOSPITAL_COMMUNITY): Payer: Self-pay

## 2011-04-23 ENCOUNTER — Encounter (HOSPITAL_COMMUNITY): Payer: Self-pay

## 2011-04-25 ENCOUNTER — Encounter (HOSPITAL_COMMUNITY): Payer: Self-pay

## 2011-04-27 ENCOUNTER — Encounter (HOSPITAL_COMMUNITY): Payer: Self-pay

## 2011-04-30 ENCOUNTER — Encounter (HOSPITAL_COMMUNITY): Payer: Self-pay

## 2011-05-02 ENCOUNTER — Ambulatory Visit (INDEPENDENT_AMBULATORY_CARE_PROVIDER_SITE_OTHER): Payer: Medicare Other | Admitting: *Deleted

## 2011-05-02 ENCOUNTER — Encounter (HOSPITAL_COMMUNITY): Payer: Self-pay

## 2011-05-02 DIAGNOSIS — I4891 Unspecified atrial fibrillation: Secondary | ICD-10-CM

## 2011-05-02 DIAGNOSIS — Z7901 Long term (current) use of anticoagulants: Secondary | ICD-10-CM

## 2011-05-04 ENCOUNTER — Encounter (HOSPITAL_COMMUNITY): Payer: Self-pay

## 2011-05-07 ENCOUNTER — Encounter (HOSPITAL_COMMUNITY): Payer: Self-pay

## 2011-05-07 ENCOUNTER — Encounter: Payer: Self-pay | Admitting: Cardiology

## 2011-05-09 ENCOUNTER — Ambulatory Visit (INDEPENDENT_AMBULATORY_CARE_PROVIDER_SITE_OTHER): Payer: Medicare Other | Admitting: Cardiology

## 2011-05-09 ENCOUNTER — Encounter: Payer: Self-pay | Admitting: Cardiology

## 2011-05-09 ENCOUNTER — Encounter (HOSPITAL_COMMUNITY): Payer: Self-pay | Attending: Cardiology

## 2011-05-09 VITALS — BP 118/78 | HR 74 | Ht 67.0 in | Wt 150.0 lb

## 2011-05-09 DIAGNOSIS — I2510 Atherosclerotic heart disease of native coronary artery without angina pectoris: Secondary | ICD-10-CM

## 2011-05-09 DIAGNOSIS — I509 Heart failure, unspecified: Secondary | ICD-10-CM | POA: Insufficient documentation

## 2011-05-09 DIAGNOSIS — I059 Rheumatic mitral valve disease, unspecified: Secondary | ICD-10-CM | POA: Insufficient documentation

## 2011-05-09 DIAGNOSIS — I501 Left ventricular failure: Secondary | ICD-10-CM | POA: Insufficient documentation

## 2011-05-09 DIAGNOSIS — Z9861 Coronary angioplasty status: Secondary | ICD-10-CM | POA: Insufficient documentation

## 2011-05-09 DIAGNOSIS — E7800 Pure hypercholesterolemia, unspecified: Secondary | ICD-10-CM

## 2011-05-09 DIAGNOSIS — I959 Hypotension, unspecified: Secondary | ICD-10-CM | POA: Insufficient documentation

## 2011-05-09 DIAGNOSIS — I472 Ventricular tachycardia, unspecified: Secondary | ICD-10-CM | POA: Insufficient documentation

## 2011-05-09 DIAGNOSIS — Z5189 Encounter for other specified aftercare: Secondary | ICD-10-CM | POA: Insufficient documentation

## 2011-05-09 DIAGNOSIS — I447 Left bundle-branch block, unspecified: Secondary | ICD-10-CM | POA: Insufficient documentation

## 2011-05-09 DIAGNOSIS — F3289 Other specified depressive episodes: Secondary | ICD-10-CM | POA: Insufficient documentation

## 2011-05-09 DIAGNOSIS — Z950 Presence of cardiac pacemaker: Secondary | ICD-10-CM | POA: Insufficient documentation

## 2011-05-09 DIAGNOSIS — Z8674 Personal history of sudden cardiac arrest: Secondary | ICD-10-CM | POA: Insufficient documentation

## 2011-05-09 DIAGNOSIS — Z9889 Other specified postprocedural states: Secondary | ICD-10-CM | POA: Insufficient documentation

## 2011-05-09 DIAGNOSIS — E785 Hyperlipidemia, unspecified: Secondary | ICD-10-CM | POA: Insufficient documentation

## 2011-05-09 DIAGNOSIS — I4729 Other ventricular tachycardia: Secondary | ICD-10-CM | POA: Insufficient documentation

## 2011-05-09 DIAGNOSIS — I2589 Other forms of chronic ischemic heart disease: Secondary | ICD-10-CM | POA: Insufficient documentation

## 2011-05-09 DIAGNOSIS — Z951 Presence of aortocoronary bypass graft: Secondary | ICD-10-CM | POA: Insufficient documentation

## 2011-05-09 DIAGNOSIS — D509 Iron deficiency anemia, unspecified: Secondary | ICD-10-CM | POA: Insufficient documentation

## 2011-05-09 DIAGNOSIS — I4891 Unspecified atrial fibrillation: Secondary | ICD-10-CM | POA: Insufficient documentation

## 2011-05-09 DIAGNOSIS — Z7901 Long term (current) use of anticoagulants: Secondary | ICD-10-CM | POA: Insufficient documentation

## 2011-05-09 DIAGNOSIS — R092 Respiratory arrest: Secondary | ICD-10-CM | POA: Insufficient documentation

## 2011-05-09 DIAGNOSIS — I252 Old myocardial infarction: Secondary | ICD-10-CM | POA: Insufficient documentation

## 2011-05-09 DIAGNOSIS — Z7982 Long term (current) use of aspirin: Secondary | ICD-10-CM | POA: Insufficient documentation

## 2011-05-09 DIAGNOSIS — F329 Major depressive disorder, single episode, unspecified: Secondary | ICD-10-CM | POA: Insufficient documentation

## 2011-05-09 DIAGNOSIS — D890 Polyclonal hypergammaglobulinemia: Secondary | ICD-10-CM | POA: Insufficient documentation

## 2011-05-09 DIAGNOSIS — I2789 Other specified pulmonary heart diseases: Secondary | ICD-10-CM | POA: Insufficient documentation

## 2011-05-09 DIAGNOSIS — I5022 Chronic systolic (congestive) heart failure: Secondary | ICD-10-CM

## 2011-05-09 NOTE — Assessment & Plan Note (Signed)
She remains asymptomatic. She is not a candidate for repeat open heart surgery. She had a very difficult time with her first operation.

## 2011-05-09 NOTE — Patient Instructions (Signed)
Continue your current medications.  I will see you again in 6 months with fasting lab work.   

## 2011-05-09 NOTE — Assessment & Plan Note (Signed)
She has maintained sinus rhythm very well on Tikosyn. She does have a DDD pacemaker in place and is followed by Dr. Johney Frame. She will continue on long-term anticoagulation.

## 2011-05-09 NOTE — Assessment & Plan Note (Signed)
She appears to be well compensated on exam. We will continue with her current medical therapy. Continue sodium restriction.

## 2011-05-09 NOTE — Progress Notes (Signed)
[name] Date of Birth: Jan 16, 1930   History of Present Illness: [name] is seen today for followup. She seems to be adjusting better to the loss of her husband this past year. She is still going to cardiac rehabilitation 3 times a week. She states that her breathing is doing well. She denies any palpitations or chest pain. She has had no bleeding on her Coumadin.  Current Outpatient Prescriptions on File Prior to Visit  Medication Sig Dispense Refill  . Cholecalciferol (VITAMIN D) 2000 UNITS tablet Take 2,000 Units by mouth daily.        . digoxin (LANOXIN) 0.125 MG tablet Take 1 tablet (125 mcg total) by mouth daily.  30 tablet  5  . dofetilide (TIKOSYN) 250 MCG capsule Take 250 mcg by mouth 2 (two) times daily.        Marland Kitchen LORazepam (ATIVAN) 0.5 MG tablet Take 0.5 mg by mouth every 8 (eight) hours as needed.        . metoprolol tartrate (LOPRESSOR) 25 MG tablet TAKE 1/2 TABLET BY MOUTH TWICE A DAY  30 tablet  5  . pantoprazole (PROTONIX) 40 MG tablet TAKE 1 TABLET BY MOUTH EVERY DAY  30 tablet  5  . rosuvastatin (CRESTOR) 20 MG tablet Take 10 mg by mouth daily.       Marland Kitchen spironolactone (ALDACTONE) 25 MG tablet Take 1 tablet (25 mg total) by mouth daily.  30 tablet  11  . warfarin (COUMADIN) 5 MG tablet Take 5 mg by mouth as directed.          Allergies  Allergen Reactions  . Amiodarone   . Benadryl Allergy     Past Medical History  Diagnosis Date  . Coronary artery disease   . Atrial fibrillation   . CHF (congestive heart failure)     EF 35%  . Hypertension   . Anxiety   . Depression   . Hyperlipidemia   . Mitral insufficiency   . IDA (iron deficiency anemia)   . Subarachnoid hemorrhage   . LBBB (left bundle branch block)     Past Surgical History  Procedure Date  . Cardiac catheterization 03/19/2007    EF 45%. THERE IS 2+ MITRAL INSUFFICIENCY. THERE IS MODERATE INFERIOR WALL HYPOKINESIA WITH OVERALL MILD TO MODERATE LV DYSFUNCTION  . Coronary stent placement  1996, AND 03/2007    RIGHT CORONARY  . Removal of colon polyp     AND A LARGE CECAL POLYP  . Coronary artery bypass graft 04/2008    INCLUDING A MITRAL VALVE REPAIR AND A LEFT SIDED MAZE PROCEDURE  . Coronary artery bypass graft     X2,. WITH A LEFT SIDED MAZE PROCEDURE MITRAL VALVE REPAIR. THIS INCLUDED AN LIMA GRAFT TO THE LAD, AND A SAPHENOUS VEIN GRAFT TO THE PDA.  Marland Kitchen Transthoracic echocardiogram 06/21/2008    EF 35%  . Cardiac pacemaker placement   . Mitral valve repair     History  Smoking status  . Former Smoker  . Quit date: 05/06/1996  Smokeless tobacco  . Not on file    History  Alcohol Use No    Family History  Problem Relation Age of Onset  . Heart attack Mother   . Cancer Father     Review of Systems: As noted in history of present illness.  All other systems were reviewed and are negative.  Physical Exam: BP 118/78  Pulse 74  Ht 5\' 7"  (1.702 m)  Wt 150 lb (68.04 kg)  BMI  23.49 kg/m2 She is an elderly female in no acute distress. She is normocephalic, atraumatic. Pupils are equal round and reactive to light and accommodation. Extraocular movements are full. Oropharynx is clear. Neck is supple without JVD, adenopathy, thyromegaly, or bruits. Lungs are clear. Cardiac exam reveals a regular rate and rhythm with a grade 1/6 systolic murmur at the apex. There is no S3. Abdomen is soft and nontender. She has no edema. Pedal pulses are good. She is alert and oriented x3. Cranial nerves II through XII are intact. Skin is warm and dry. LABORATORY DATA:   Assessment / Plan:

## 2011-05-11 ENCOUNTER — Encounter (HOSPITAL_COMMUNITY): Payer: Self-pay

## 2011-05-14 ENCOUNTER — Encounter (HOSPITAL_COMMUNITY): Payer: Self-pay

## 2011-05-16 ENCOUNTER — Ambulatory Visit (INDEPENDENT_AMBULATORY_CARE_PROVIDER_SITE_OTHER): Payer: Medicare Other | Admitting: *Deleted

## 2011-05-16 ENCOUNTER — Encounter (HOSPITAL_COMMUNITY): Payer: Self-pay

## 2011-05-16 DIAGNOSIS — I4891 Unspecified atrial fibrillation: Secondary | ICD-10-CM

## 2011-05-16 DIAGNOSIS — Z7901 Long term (current) use of anticoagulants: Secondary | ICD-10-CM

## 2011-05-18 ENCOUNTER — Encounter (HOSPITAL_COMMUNITY): Payer: Self-pay

## 2011-05-21 ENCOUNTER — Encounter (HOSPITAL_COMMUNITY): Payer: Self-pay

## 2011-05-23 ENCOUNTER — Encounter (HOSPITAL_COMMUNITY): Payer: Self-pay

## 2011-05-25 ENCOUNTER — Encounter (HOSPITAL_COMMUNITY): Payer: Self-pay

## 2011-05-28 ENCOUNTER — Encounter (HOSPITAL_COMMUNITY): Payer: Self-pay

## 2011-05-30 ENCOUNTER — Encounter (HOSPITAL_COMMUNITY): Payer: Self-pay

## 2011-06-01 ENCOUNTER — Encounter (HOSPITAL_COMMUNITY): Payer: Self-pay

## 2011-06-04 ENCOUNTER — Other Ambulatory Visit: Payer: Self-pay | Admitting: Cardiology

## 2011-06-04 ENCOUNTER — Encounter (HOSPITAL_COMMUNITY): Payer: Self-pay

## 2011-06-04 NOTE — Telephone Encounter (Signed)
Kennon Rounds here is the message. It is a refill for coumadin. Can you see this?

## 2011-06-05 NOTE — Telephone Encounter (Signed)
Refill for [name]; on warfarin

## 2011-06-06 ENCOUNTER — Encounter (HOSPITAL_COMMUNITY): Payer: Self-pay

## 2011-06-08 ENCOUNTER — Encounter: Payer: Self-pay | Admitting: *Deleted

## 2011-06-08 ENCOUNTER — Encounter (HOSPITAL_COMMUNITY): Payer: Self-pay

## 2011-06-11 ENCOUNTER — Encounter (HOSPITAL_COMMUNITY): Payer: Self-pay | Attending: Cardiology

## 2011-06-11 DIAGNOSIS — I501 Left ventricular failure: Secondary | ICD-10-CM | POA: Insufficient documentation

## 2011-06-11 DIAGNOSIS — I2510 Atherosclerotic heart disease of native coronary artery without angina pectoris: Secondary | ICD-10-CM | POA: Insufficient documentation

## 2011-06-11 DIAGNOSIS — Z951 Presence of aortocoronary bypass graft: Secondary | ICD-10-CM | POA: Insufficient documentation

## 2011-06-11 DIAGNOSIS — I447 Left bundle-branch block, unspecified: Secondary | ICD-10-CM | POA: Insufficient documentation

## 2011-06-11 DIAGNOSIS — I252 Old myocardial infarction: Secondary | ICD-10-CM | POA: Insufficient documentation

## 2011-06-11 DIAGNOSIS — D890 Polyclonal hypergammaglobulinemia: Secondary | ICD-10-CM | POA: Insufficient documentation

## 2011-06-11 DIAGNOSIS — F3289 Other specified depressive episodes: Secondary | ICD-10-CM | POA: Insufficient documentation

## 2011-06-11 DIAGNOSIS — Z5189 Encounter for other specified aftercare: Secondary | ICD-10-CM | POA: Insufficient documentation

## 2011-06-11 DIAGNOSIS — I2789 Other specified pulmonary heart diseases: Secondary | ICD-10-CM | POA: Insufficient documentation

## 2011-06-11 DIAGNOSIS — Z9889 Other specified postprocedural states: Secondary | ICD-10-CM | POA: Insufficient documentation

## 2011-06-11 DIAGNOSIS — Z8674 Personal history of sudden cardiac arrest: Secondary | ICD-10-CM | POA: Insufficient documentation

## 2011-06-11 DIAGNOSIS — I4729 Other ventricular tachycardia: Secondary | ICD-10-CM | POA: Insufficient documentation

## 2011-06-11 DIAGNOSIS — F329 Major depressive disorder, single episode, unspecified: Secondary | ICD-10-CM | POA: Insufficient documentation

## 2011-06-11 DIAGNOSIS — Z9861 Coronary angioplasty status: Secondary | ICD-10-CM | POA: Insufficient documentation

## 2011-06-11 DIAGNOSIS — R092 Respiratory arrest: Secondary | ICD-10-CM | POA: Insufficient documentation

## 2011-06-11 DIAGNOSIS — I509 Heart failure, unspecified: Secondary | ICD-10-CM | POA: Insufficient documentation

## 2011-06-11 DIAGNOSIS — I959 Hypotension, unspecified: Secondary | ICD-10-CM | POA: Insufficient documentation

## 2011-06-11 DIAGNOSIS — Z950 Presence of cardiac pacemaker: Secondary | ICD-10-CM | POA: Insufficient documentation

## 2011-06-11 DIAGNOSIS — I059 Rheumatic mitral valve disease, unspecified: Secondary | ICD-10-CM | POA: Insufficient documentation

## 2011-06-11 DIAGNOSIS — D509 Iron deficiency anemia, unspecified: Secondary | ICD-10-CM | POA: Insufficient documentation

## 2011-06-11 DIAGNOSIS — I472 Ventricular tachycardia, unspecified: Secondary | ICD-10-CM | POA: Insufficient documentation

## 2011-06-11 DIAGNOSIS — I2589 Other forms of chronic ischemic heart disease: Secondary | ICD-10-CM | POA: Insufficient documentation

## 2011-06-11 DIAGNOSIS — Z7982 Long term (current) use of aspirin: Secondary | ICD-10-CM | POA: Insufficient documentation

## 2011-06-11 DIAGNOSIS — E785 Hyperlipidemia, unspecified: Secondary | ICD-10-CM | POA: Insufficient documentation

## 2011-06-11 DIAGNOSIS — I4891 Unspecified atrial fibrillation: Secondary | ICD-10-CM | POA: Insufficient documentation

## 2011-06-11 DIAGNOSIS — Z7901 Long term (current) use of anticoagulants: Secondary | ICD-10-CM | POA: Insufficient documentation

## 2011-06-13 ENCOUNTER — Encounter (HOSPITAL_COMMUNITY): Payer: Self-pay

## 2011-06-13 ENCOUNTER — Telehealth: Payer: Self-pay | Admitting: Cardiology

## 2011-06-13 ENCOUNTER — Ambulatory Visit (INDEPENDENT_AMBULATORY_CARE_PROVIDER_SITE_OTHER): Payer: Medicare Other | Admitting: *Deleted

## 2011-06-13 DIAGNOSIS — Z7901 Long term (current) use of anticoagulants: Secondary | ICD-10-CM

## 2011-06-13 DIAGNOSIS — I4891 Unspecified atrial fibrillation: Secondary | ICD-10-CM

## 2011-06-13 NOTE — Telephone Encounter (Signed)
Has a question about when she should get

## 2011-06-14 ENCOUNTER — Ambulatory Visit (INDEPENDENT_AMBULATORY_CARE_PROVIDER_SITE_OTHER): Payer: Medicare Other | Admitting: *Deleted

## 2011-06-14 ENCOUNTER — Encounter: Payer: Self-pay | Admitting: Internal Medicine

## 2011-06-14 DIAGNOSIS — I495 Sick sinus syndrome: Secondary | ICD-10-CM

## 2011-06-14 LAB — PACEMAKER DEVICE OBSERVATION
AL THRESHOLD: 2 V
ATRIAL PACING PM: 4.33
BAMS-0001: 170 {beats}/min
RV LEAD THRESHOLD: 1 V
VENTRICULAR PACING PM: 0.26

## 2011-06-14 NOTE — Progress Notes (Signed)
PPM check 

## 2011-06-15 ENCOUNTER — Encounter (HOSPITAL_COMMUNITY): Payer: Self-pay

## 2011-06-18 ENCOUNTER — Encounter (HOSPITAL_COMMUNITY): Payer: Self-pay

## 2011-06-20 ENCOUNTER — Encounter (HOSPITAL_COMMUNITY): Payer: Self-pay

## 2011-06-22 ENCOUNTER — Encounter (HOSPITAL_COMMUNITY): Payer: Self-pay

## 2011-06-25 ENCOUNTER — Encounter (HOSPITAL_COMMUNITY): Payer: Self-pay

## 2011-06-27 ENCOUNTER — Encounter (HOSPITAL_COMMUNITY): Payer: Self-pay

## 2011-06-29 ENCOUNTER — Encounter (HOSPITAL_COMMUNITY): Payer: Self-pay

## 2011-06-29 LAB — PROTIME-INR
INR: 1.8 — ABNORMAL HIGH
INR: 1.8 — ABNORMAL HIGH
INR: 2 — ABNORMAL HIGH
Prothrombin Time: 21 — ABNORMAL HIGH
Prothrombin Time: 21.2 — ABNORMAL HIGH
Prothrombin Time: 22.7 — ABNORMAL HIGH
Prothrombin Time: 23.3 — ABNORMAL HIGH
Prothrombin Time: 13.2
Prothrombin Time: 15.8 — ABNORMAL HIGH
Prothrombin Time: 18.8 — ABNORMAL HIGH
Prothrombin Time: 25.3 — ABNORMAL HIGH

## 2011-06-29 LAB — DIFFERENTIAL
Basophils Absolute: 0.1
Basophils Absolute: 0.1
Basophils Absolute: 0
Basophils Absolute: 0
Basophils Relative: 0
Basophils Relative: 1
Basophils Relative: 1
Basophils Relative: 1
Eosinophils Absolute: 0
Eosinophils Absolute: 0
Eosinophils Absolute: 0.2
Eosinophils Absolute: 0
Eosinophils Absolute: 0
Eosinophils Relative: 0
Eosinophils Relative: 1
Eosinophils Relative: 2
Eosinophils Relative: 0
Eosinophils Relative: 0
Lymphocytes Relative: 21
Lymphocytes Relative: 7 — ABNORMAL LOW
Lymphs Abs: 1.6
Lymphs Abs: 1.6
Lymphs Abs: 1.1
Lymphs Abs: 1.7
Monocytes Absolute: 0.9
Monocytes Absolute: 1.1 — ABNORMAL HIGH
Monocytes Absolute: 0.7
Monocytes Absolute: 0.9
Monocytes Relative: 5
Monocytes Relative: 9
Monocytes Relative: 7
Neutro Abs: 13.4 — ABNORMAL HIGH
Neutro Abs: 8.2 — ABNORMAL HIGH
Neutro Abs: 14.6 — ABNORMAL HIGH
Neutro Abs: 7.6
Neutrophils Relative %: 70
Neutrophils Relative %: 76
Neutrophils Relative %: 85 — ABNORMAL HIGH

## 2011-06-29 LAB — CBC
HCT: 23.9 — ABNORMAL LOW
HCT: 28 — ABNORMAL LOW
HCT: 30.3 — ABNORMAL LOW
HCT: 24.9 — ABNORMAL LOW
HCT: 30.6 — ABNORMAL LOW
Hemoglobin: 10.9 — ABNORMAL LOW
Hemoglobin: 7.7 — CL
Hemoglobin: 10.4 — ABNORMAL LOW
Hemoglobin: 8 — ABNORMAL LOW
MCHC: 31.8
MCHC: 32.3
MCHC: 32.4
MCHC: 32.9
MCHC: 32.8
MCHC: 33.7
MCHC: 33.7
MCHC: 33.9
MCHC: 34.3
MCV: 77.5 — ABNORMAL LOW
MCV: 79
MCV: 79
MCV: 79.1
MCV: 84.8
Platelets: 316
Platelets: 317
Platelets: 341
Platelets: 448 — ABNORMAL HIGH
Platelets: 226
Platelets: 254
RBC: 3.61 — ABNORMAL LOW
RBC: 3.88
RBC: 4.21
RBC: 4.44
RBC: 2.23 — ABNORMAL LOW
RBC: 3.18 — ABNORMAL LOW
RBC: 3.66 — ABNORMAL LOW
RDW: 20.7 — ABNORMAL HIGH
RDW: 19.2 — ABNORMAL HIGH
RDW: 19.4 — ABNORMAL HIGH
RDW: 19.5 — ABNORMAL HIGH
RDW: 23.4 — ABNORMAL HIGH
WBC: 10
WBC: 11 — ABNORMAL HIGH
WBC: 15.8 — ABNORMAL HIGH
WBC: 8
WBC: 10.5
WBC: 14.1 — ABNORMAL HIGH
WBC: 16.5 — ABNORMAL HIGH

## 2011-06-29 LAB — OCCULT BLOOD X 1 CARD TO LAB, STOOL: Fecal Occult Bld: NEGATIVE

## 2011-06-29 LAB — COMPREHENSIVE METABOLIC PANEL
ALT: 17
ALT: 14
AST: 20
Albumin: 2.6 — ABNORMAL LOW
Albumin: 3.1 — ABNORMAL LOW
Alkaline Phosphatase: 57
BUN: 11
CO2: 28
Calcium: 8.1 — ABNORMAL LOW
Chloride: 104
Chloride: 108
GFR calc Af Amer: >60
GFR calc non Af Amer: >60
Potassium: 3.3 — ABNORMAL LOW
Sodium: 142
Total Bilirubin: 0.9

## 2011-06-29 LAB — BASIC METABOLIC PANEL
BUN: 15
BUN: 15
BUN: 11
BUN: 13
BUN: 16
CO2: 28
CO2: 29
CO2: 28
CO2: 33 — ABNORMAL HIGH
CO2: 33 — ABNORMAL HIGH
Calcium: 8.5
Calcium: 8.1 — ABNORMAL LOW
Calcium: 8.1 — ABNORMAL LOW
Calcium: 8.1 — ABNORMAL LOW
Calcium: 8.2 — ABNORMAL LOW
Calcium: 8.6
Chloride: 105
Chloride: 106
Chloride: 106
Chloride: 104
Creatinine, Ser: 0.97
Creatinine, Ser: 1
Creatinine, Ser: 0.95
Creatinine, Ser: 1.17
GFR calc Af Amer: >60
GFR calc Af Amer: 43 — ABNORMAL LOW
GFR calc Af Amer: 55 — ABNORMAL LOW
GFR calc non Af Amer: 36 — ABNORMAL LOW
GFR calc non Af Amer: 45 — ABNORMAL LOW
GFR calc non Af Amer: 57 — ABNORMAL LOW
GFR calc non Af Amer: >60
Glucose, Bld: 81
Glucose, Bld: 83
Glucose, Bld: 84
Glucose, Bld: 89
Glucose, Bld: 96
Potassium: 3.3 — ABNORMAL LOW
Potassium: 3.4 — ABNORMAL LOW
Potassium: 3.6
Potassium: 4.4
Potassium: 4.7
Sodium: 142
Sodium: 141
Sodium: 141

## 2011-06-29 LAB — I-STAT 8, (EC8 V) (CONVERTED LAB)
BUN: 46 — ABNORMAL HIGH
Bicarbonate: 27.1 — ABNORMAL HIGH
Bicarbonate: 24.2 — ABNORMAL HIGH
Chloride: 106
Chloride: 106
Glucose, Bld: 114 — ABNORMAL HIGH
Glucose, Bld: 311 — ABNORMAL HIGH
HCT: 26 — ABNORMAL LOW
Operator id: 294521
Potassium: 4.1
Sodium: 139
TCO2: 28
pCO2, Ven: 43.5 — ABNORMAL LOW
pCO2, Ven: 38 — ABNORMAL LOW
pH, Ven: 7.267
pH, Ven: 7.402 — ABNORMAL HIGH
pH, Ven: 7.411 — ABNORMAL HIGH

## 2011-06-29 LAB — CROSSMATCH

## 2011-06-29 LAB — PREPARE FRESH FROZEN PLASMA

## 2011-06-29 LAB — CK TOTAL AND CKMB (NOT AT ARMC)
CK, MB: 1.9
CK, MB: 2.3
Relative Index: INVALID
Relative Index: 1.6
Relative Index: 1.7
Total CK: 116

## 2011-06-29 LAB — APTT
aPTT: 37
aPTT: 37

## 2011-06-29 LAB — POCT CARDIAC MARKERS
CKMB, poc: 2.6
CKMB, poc: 2.1
Myoglobin, poc: 203
Myoglobin, poc: 80.2
Myoglobin, poc: 158
Myoglobin, poc: 363
Operator id: 285491
Operator id: 265201
Operator id: 294521
Troponin i, poc: <0.05
Troponin i, poc: <0.05

## 2011-06-29 LAB — TROPONIN I: Troponin I: 0.06

## 2011-06-29 LAB — CULTURE, BLOOD (ROUTINE X 2)

## 2011-06-29 LAB — POCT I-STAT CREATININE
Creatinine, Ser: 2 — ABNORMAL HIGH
Operator id: 161631
Operator id: 285491

## 2011-06-29 LAB — POCT I-STAT 3, ART BLOOD GAS (G3+)
Bicarbonate: 24
Operator id: 245492
pCO2 arterial: 35.4
pH, Arterial: 7.441 — ABNORMAL HIGH
pO2, Arterial: 61 — ABNORMAL LOW

## 2011-06-29 LAB — CARDIAC PANEL(CRET KIN+CKTOT+MB+TROPI)
CK, MB: 1.5
Total CK: 50

## 2011-06-29 LAB — IRON AND TIBC: UIBC: 254

## 2011-06-29 LAB — POTASSIUM: Potassium: 4.1

## 2011-06-29 LAB — B-NATRIURETIC PEPTIDE (CONVERTED LAB): Pro B Natriuretic peptide (BNP): 175 — ABNORMAL HIGH

## 2011-06-29 LAB — HEMOGLOBIN AND HEMATOCRIT, BLOOD
HCT: 25 — ABNORMAL LOW
Hemoglobin: 8 — ABNORMAL LOW

## 2011-06-29 LAB — SEDIMENTATION RATE: Sed Rate: 83 — ABNORMAL HIGH

## 2011-06-29 LAB — DIGOXIN LEVEL
Digoxin Level: 1.7
Digoxin Level: 0.9

## 2011-06-29 LAB — TSH: TSH: 1.193

## 2011-06-29 LAB — MAGNESIUM: Magnesium: 2.3

## 2011-07-02 ENCOUNTER — Encounter (HOSPITAL_COMMUNITY): Payer: Self-pay

## 2011-07-02 LAB — TROPONIN I: Troponin I: 0.03

## 2011-07-02 LAB — CBC
HCT: 41.9
MCV: 87.5
Platelets: 269
RDW: 20.3 — ABNORMAL HIGH
WBC: 15 — ABNORMAL HIGH

## 2011-07-02 LAB — BASIC METABOLIC PANEL
CO2: 29
Calcium: 8.6
Chloride: 101
GFR calc Af Amer: >60
Glucose, Bld: 98
Potassium: 3.5
Sodium: 138

## 2011-07-02 LAB — COMPREHENSIVE METABOLIC PANEL
BUN: 15
CO2: 24
Calcium: 9.2
Chloride: 107
Creatinine, Ser: 0.86
GFR calc Af Amer: >60
GFR calc non Af Amer: >60
Glucose, Bld: 124 — ABNORMAL HIGH
Total Bilirubin: 1

## 2011-07-02 LAB — AMIODARONE LEVEL: N-Desethyl-Amiodarone: 1.02 ug/mL (ref 0.25–3.50)

## 2011-07-02 LAB — PROTIME-INR
INR: 1
Prothrombin Time: 13.3

## 2011-07-02 LAB — CK TOTAL AND CKMB (NOT AT ARMC)
Relative Index: INVALID
Total CK: 38

## 2011-07-02 LAB — APTT: aPTT: 30

## 2011-07-03 LAB — BASIC METABOLIC PANEL
BUN: 13
CO2: 28
Calcium: 8.4
Calcium: 8.6
Creatinine, Ser: 0.78
GFR calc Af Amer: >60
GFR calc non Af Amer: >60
Glucose, Bld: 84
Glucose, Bld: 92
Potassium: 3.4 — ABNORMAL LOW
Sodium: 138

## 2011-07-03 LAB — B-NATRIURETIC PEPTIDE (CONVERTED LAB): Pro B Natriuretic peptide (BNP): 184 — ABNORMAL HIGH

## 2011-07-04 ENCOUNTER — Encounter (HOSPITAL_COMMUNITY): Payer: Self-pay

## 2011-07-05 LAB — PROTIME-INR
INR: 2.8 — ABNORMAL HIGH
Prothrombin Time: 26 — ABNORMAL HIGH
Prothrombin Time: 27.1 — ABNORMAL HIGH
Prothrombin Time: 30.2 — ABNORMAL HIGH

## 2011-07-05 LAB — CBC
HCT: 45.9
HCT: 38.8
Hemoglobin: 12.8
MCHC: 33.1
MCV: 88.4
Platelets: 243
RBC: 4.4
RDW: 16 — ABNORMAL HIGH
RDW: 15.6 — ABNORMAL HIGH
WBC: 14.1 — ABNORMAL HIGH
WBC: 9

## 2011-07-05 LAB — BASIC METABOLIC PANEL
BUN: 14
BUN: 15
BUN: 17
CO2: 25
Chloride: 105
Chloride: 106
Chloride: 107
Creatinine, Ser: 0.94
GFR calc Af Amer: >60
GFR calc Af Amer: >60
GFR calc non Af Amer: >60
GFR calc non Af Amer: >60
Glucose, Bld: 85
Glucose, Bld: 123 — ABNORMAL HIGH
Potassium: 3.9
Potassium: 4.1
Potassium: 4.3
Potassium: 3.6
Sodium: 142
Sodium: 143
Sodium: 138

## 2011-07-05 LAB — COMPREHENSIVE METABOLIC PANEL
Albumin: 3.7
BUN: 14
Calcium: 9.2
Chloride: 105
Creatinine, Ser: 0.81
Total Bilirubin: 1.1

## 2011-07-05 LAB — DIFFERENTIAL
Basophils Absolute: 0.1
Eosinophils Relative: 1
Lymphocytes Relative: 9 — ABNORMAL LOW
Lymphocytes Relative: 18
Lymphs Abs: 1.6
Monocytes Absolute: 0.6
Monocytes Absolute: 0.8
Monocytes Relative: 9
Neutro Abs: 12 — ABNORMAL HIGH
Neutro Abs: 6.5

## 2011-07-05 LAB — APTT: aPTT: 34

## 2011-07-05 LAB — POCT CARDIAC MARKERS
CKMB, poc: 1.5
CKMB, poc: 2
Myoglobin, poc: 46.1
Myoglobin, poc: 52.3
Operator id: 284251
Operator id: 161631
Operator id: 161631
Troponin i, poc: <0.05
Troponin i, poc: <0.05
Troponin i, poc: <0.05

## 2011-07-05 LAB — CARDIAC PANEL(CRET KIN+CKTOT+MB+TROPI)
CK, MB: 2
Relative Index: INVALID
Troponin I: 0.03

## 2011-07-05 LAB — B-NATRIURETIC PEPTIDE (CONVERTED LAB)
Pro B Natriuretic peptide (BNP): 1676 — ABNORMAL HIGH
Pro B Natriuretic peptide (BNP): 269 — ABNORMAL HIGH

## 2011-07-06 ENCOUNTER — Encounter (HOSPITAL_COMMUNITY): Payer: Self-pay

## 2011-07-06 LAB — BASIC METABOLIC PANEL
BUN: 10
BUN: 10
BUN: 13
BUN: 15
BUN: 15
BUN: 16
BUN: 17
BUN: 17
BUN: 18
BUN: 21
BUN: 6
BUN: 8
CO2: 25
CO2: 30
CO2: 26
CO2: 27
CO2: 27
CO2: 28
CO2: 28
CO2: 30
CO2: 30
CO2: 31
CO2: 35 — ABNORMAL HIGH
Calcium: 8.5
Calcium: 7.6 — ABNORMAL LOW
Calcium: 7.9 — ABNORMAL LOW
Calcium: 8 — ABNORMAL LOW
Calcium: 8.1 — ABNORMAL LOW
Calcium: 8.1 — ABNORMAL LOW
Calcium: 8.2 — ABNORMAL LOW
Calcium: 8.2 — ABNORMAL LOW
Calcium: 8.2 — ABNORMAL LOW
Calcium: 8.2 — ABNORMAL LOW
Calcium: 8.5
Calcium: 9.2
Chloride: 100
Chloride: 101
Chloride: 101
Chloride: 102
Chloride: 102
Chloride: 103
Chloride: 104
Chloride: 104
Chloride: 107
Chloride: 107
Chloride: 113 — ABNORMAL HIGH
Chloride: 93 — ABNORMAL LOW
Chloride: 98
Creatinine, Ser: 1.06
Creatinine, Ser: 0.58
Creatinine, Ser: 0.63
Creatinine, Ser: 0.66
Creatinine, Ser: 0.69
Creatinine, Ser: 0.69
Creatinine, Ser: 0.7
Creatinine, Ser: 0.71
Creatinine, Ser: 0.72
Creatinine, Ser: 0.75
Creatinine, Ser: 0.78
Creatinine, Ser: 0.79
GFR calc Af Amer: >60
GFR calc Af Amer: >60
GFR calc Af Amer: >60
GFR calc Af Amer: >60
GFR calc Af Amer: >60
GFR calc Af Amer: >60
GFR calc Af Amer: >60
GFR calc Af Amer: >60
GFR calc Af Amer: >60
GFR calc Af Amer: >60
GFR calc Af Amer: >60
GFR calc Af Amer: >60
GFR calc Af Amer: >60
GFR calc non Af Amer: >60
GFR calc non Af Amer: >60
GFR calc non Af Amer: >60
GFR calc non Af Amer: >60
GFR calc non Af Amer: >60
GFR calc non Af Amer: >60
GFR calc non Af Amer: >60
GFR calc non Af Amer: >60
GFR calc non Af Amer: >60
GFR calc non Af Amer: >60
GFR calc non Af Amer: >60
GFR calc non Af Amer: >60
Glucose, Bld: 78
Glucose, Bld: 105 — ABNORMAL HIGH
Glucose, Bld: 106 — ABNORMAL HIGH
Glucose, Bld: 106 — ABNORMAL HIGH
Glucose, Bld: 113 — ABNORMAL HIGH
Glucose, Bld: 114 — ABNORMAL HIGH
Glucose, Bld: 128 — ABNORMAL HIGH
Glucose, Bld: 87
Glucose, Bld: 88
Glucose, Bld: 92
Glucose, Bld: 93
Glucose, Bld: 97
Glucose, Bld: 99
Potassium: 3.6
Potassium: 2.9 — ABNORMAL LOW
Potassium: 3.4 — ABNORMAL LOW
Potassium: 3.6
Potassium: 3.7
Potassium: 3.8
Potassium: 3.8
Potassium: 3.9
Potassium: 3.9
Potassium: 3.9
Potassium: 4.2
Potassium: 4.2
Potassium: 4.4
Sodium: 141
Sodium: 131 — ABNORMAL LOW
Sodium: 134 — ABNORMAL LOW
Sodium: 136
Sodium: 137
Sodium: 138
Sodium: 138
Sodium: 139
Sodium: 139
Sodium: 139
Sodium: 140
Sodium: 143

## 2011-07-06 LAB — POCT I-STAT 4, (NA,K, GLUC, HGB,HCT)
Glucose, Bld: 102 — ABNORMAL HIGH
Glucose, Bld: 120 — ABNORMAL HIGH
Glucose, Bld: 156 — ABNORMAL HIGH
Glucose, Bld: 88
HCT: 21 — ABNORMAL LOW
HCT: 26 — ABNORMAL LOW
HCT: 29 — ABNORMAL LOW
HCT: 31 — ABNORMAL LOW
Hemoglobin: 11.2 — ABNORMAL LOW
Hemoglobin: 7.8 — CL
Hemoglobin: 8.8 — ABNORMAL LOW
Potassium: 3.1 — ABNORMAL LOW
Potassium: 3.3 — ABNORMAL LOW
Potassium: 3.6
Potassium: 3.8
Potassium: 3.9
Sodium: 137
Sodium: 138
Sodium: 140
Sodium: 144

## 2011-07-06 LAB — POCT I-STAT 3, ART BLOOD GAS (G3+)
Acid-Base Excess: 2
Acid-Base Excess: 6 — ABNORMAL HIGH
Acid-Base Excess: 7 — ABNORMAL HIGH
Acid-Base Excess: 6 — ABNORMAL HIGH
Bicarbonate: 26.6 — ABNORMAL HIGH
Bicarbonate: 28.5 — ABNORMAL HIGH
O2 Saturation: 94
O2 Saturation: 97
O2 Saturation: 100
O2 Saturation: 95
Operator id: 135691
Patient temperature: 98.3
Patient temperature: 98.3
Patient temperature: 99
Patient temperature: 35.5
TCO2: 28
TCO2: 25
pCO2 arterial: 40.1
pCO2 arterial: 63.6
pCO2 arterial: 38.9
pH, Arterial: 7.327 — ABNORMAL LOW
pH, Arterial: 7.428 — ABNORMAL HIGH
pH, Arterial: 7.406 — ABNORMAL HIGH
pH, Arterial: 7.472 — ABNORMAL HIGH
pH, Arterial: 7.473 — ABNORMAL HIGH
pO2, Arterial: 65 — ABNORMAL LOW

## 2011-07-06 LAB — CBC
HCT: 37
HCT: 38.9
HCT: 40.1
HCT: 53 — ABNORMAL HIGH
HCT: 27.6 — ABNORMAL LOW
HCT: 28.6 — ABNORMAL LOW
HCT: 29.2 — ABNORMAL LOW
HCT: 29.5 — ABNORMAL LOW
HCT: 29.8 — ABNORMAL LOW
HCT: 30.3 — ABNORMAL LOW
HCT: 30.5 — ABNORMAL LOW
HCT: 30.6 — ABNORMAL LOW
HCT: 30.8 — ABNORMAL LOW
HCT: 30.8 — ABNORMAL LOW
HCT: 32.2 — ABNORMAL LOW
HCT: 32.2 — ABNORMAL LOW
HCT: 37.5
HCT: 40.8
Hemoglobin: 12.2
Hemoglobin: 13.1
Hemoglobin: 17.4 — ABNORMAL HIGH
Hemoglobin: 10.1 — ABNORMAL LOW
Hemoglobin: 10.1 — ABNORMAL LOW
Hemoglobin: 10.1 — ABNORMAL LOW
Hemoglobin: 10.2 — ABNORMAL LOW
Hemoglobin: 10.3 — ABNORMAL LOW
Hemoglobin: 10.8 — ABNORMAL LOW
Hemoglobin: 10.8 — ABNORMAL LOW
Hemoglobin: 11.3 — ABNORMAL LOW
Hemoglobin: 12.6
Hemoglobin: 9.2 — ABNORMAL LOW
Hemoglobin: 9.6 — ABNORMAL LOW
Hemoglobin: 9.8 — ABNORMAL LOW
Hemoglobin: 9.9 — ABNORMAL LOW
MCHC: 32.6
MCHC: 33.1
MCHC: 33.2
MCHC: 32.9
MCHC: 33
MCHC: 33.1
MCHC: 33.1
MCHC: 33.1
MCHC: 33.3
MCHC: 33.3
MCHC: 33.3
MCHC: 33.4
MCHC: 33.5
MCHC: 33.7
MCHC: 33.7
MCV: 89.2
MCV: 90
MCV: 90.7
MCV: 90.9
MCV: 88
MCV: 88.8
MCV: 89.4
MCV: 89.6
MCV: 89.7
MCV: 90.2
MCV: 90.3
MCV: 90.5
MCV: 90.5
MCV: 90.9
MCV: 91
MCV: 91.4
MCV: 91.5
MCV: 91.6
Platelets: 178
Platelets: 248
Platelets: 114 — ABNORMAL LOW
Platelets: 115 — ABNORMAL LOW
Platelets: 119 — ABNORMAL LOW
Platelets: 123 — ABNORMAL LOW
Platelets: 131 — ABNORMAL LOW
Platelets: 133 — ABNORMAL LOW
Platelets: 156
Platelets: 182
Platelets: 202
Platelets: 266
Platelets: 272
Platelets: 308
Platelets: 359
Platelets: 364
RBC: 4.42
RBC: 3.08 — ABNORMAL LOW
RBC: 3.19 — ABNORMAL LOW
RBC: 3.29 — ABNORMAL LOW
RBC: 3.3 — ABNORMAL LOW
RBC: 3.34 — ABNORMAL LOW
RBC: 3.37 — ABNORMAL LOW
RBC: 3.38 — ABNORMAL LOW
RBC: 3.4 — ABNORMAL LOW
RBC: 3.47 — ABNORMAL LOW
RBC: 3.53 — ABNORMAL LOW
RBC: 3.65 — ABNORMAL LOW
RBC: 3.69 — ABNORMAL LOW
RBC: 4.19
RBC: 4.3
RDW: 16.4 — ABNORMAL HIGH
RDW: 16.5 — ABNORMAL HIGH
RDW: 17.2 — ABNORMAL HIGH
RDW: 16.2 — ABNORMAL HIGH
RDW: 16.4 — ABNORMAL HIGH
RDW: 16.5 — ABNORMAL HIGH
RDW: 16.6 — ABNORMAL HIGH
RDW: 16.6 — ABNORMAL HIGH
RDW: 16.7 — ABNORMAL HIGH
RDW: 16.8 — ABNORMAL HIGH
RDW: 17 — ABNORMAL HIGH
RDW: 17 — ABNORMAL HIGH
RDW: 17.1 — ABNORMAL HIGH
RDW: 17.1 — ABNORMAL HIGH
RDW: 17.3 — ABNORMAL HIGH
RDW: 17.4 — ABNORMAL HIGH
RDW: 17.7 — ABNORMAL HIGH
WBC: 12.9 — ABNORMAL HIGH
WBC: 15 — ABNORMAL HIGH
WBC: 10.5
WBC: 10.7 — ABNORMAL HIGH
WBC: 11.3 — ABNORMAL HIGH
WBC: 11.6 — ABNORMAL HIGH
WBC: 12 — ABNORMAL HIGH
WBC: 12.1 — ABNORMAL HIGH
WBC: 12.4 — ABNORMAL HIGH
WBC: 13.1 — ABNORMAL HIGH
WBC: 13.3 — ABNORMAL HIGH
WBC: 13.8 — ABNORMAL HIGH
WBC: 7.1
WBC: 7.1
WBC: 9.1

## 2011-07-06 LAB — COMPREHENSIVE METABOLIC PANEL
ALT: 29
AST: 201 — ABNORMAL HIGH
AST: 31
Albumin: 3 — ABNORMAL LOW
Albumin: 4
Albumin: 3.3 — ABNORMAL LOW
Alkaline Phosphatase: 161 — ABNORMAL HIGH
Alkaline Phosphatase: 68
BUN: 23
BUN: 10
CO2: 18 — ABNORMAL LOW
CO2: 27
Calcium: 9.3
Chloride: 103
Chloride: 107
Chloride: 104
Creatinine, Ser: 1
Creatinine, Ser: 0.85
GFR calc Af Amer: >60
GFR calc Af Amer: >60
GFR calc non Af Amer: 54 — ABNORMAL LOW
GFR calc non Af Amer: 58 — ABNORMAL LOW
GFR calc non Af Amer: >60
Glucose, Bld: 99
Potassium: 4.1
Potassium: 3.8
Sodium: 139
Total Bilirubin: 1
Total Bilirubin: 1.5 — ABNORMAL HIGH
Total Bilirubin: 0.7
Total Protein: 7.1

## 2011-07-06 LAB — URINALYSIS, ROUTINE W REFLEX MICROSCOPIC
Bilirubin Urine: NEGATIVE
Glucose, UA: NEGATIVE
Ketones, ur: NEGATIVE
Ketones, ur: NEGATIVE
Nitrite: NEGATIVE
Nitrite: NEGATIVE
Protein, ur: NEGATIVE
Protein, ur: NEGATIVE
Specific Gravity, Urine: 1.008
Urobilinogen, UA: 0.2
pH: 5
pH: 6.5

## 2011-07-06 LAB — APTT
aPTT: 37
aPTT: 38 — ABNORMAL HIGH

## 2011-07-06 LAB — CROSSMATCH
ABO/RH(D): O POS
Antibody Screen: NEGATIVE

## 2011-07-06 LAB — PROTIME-INR
INR: 1.1
INR: 1.7 — ABNORMAL HIGH
INR: 1
INR: 1.2
INR: 1.3
INR: 1.4
INR: 1.4
INR: 1.4
INR: 1.4
INR: 1.5
INR: 2.1 — ABNORMAL HIGH
Prothrombin Time: 14.8
Prothrombin Time: 20.5 — ABNORMAL HIGH
Prothrombin Time: 30.3 — ABNORMAL HIGH
Prothrombin Time: 13.1
Prothrombin Time: 13.2
Prothrombin Time: 16 — ABNORMAL HIGH
Prothrombin Time: 16.5 — ABNORMAL HIGH
Prothrombin Time: 17.8 — ABNORMAL HIGH
Prothrombin Time: 17.8 — ABNORMAL HIGH
Prothrombin Time: 18 — ABNORMAL HIGH
Prothrombin Time: 18.2 — ABNORMAL HIGH
Prothrombin Time: 18.7 — ABNORMAL HIGH
Prothrombin Time: 24.5 — ABNORMAL HIGH
Prothrombin Time: 28.6 — ABNORMAL HIGH

## 2011-07-06 LAB — PHOSPHORUS: Phosphorus: 3.5

## 2011-07-06 LAB — POCT I-STAT 3, VENOUS BLOOD GAS (G3P V): Acid-Base Excess: 1

## 2011-07-06 LAB — CULTURE, BLOOD (ROUTINE X 2)
Culture: NO GROWTH
Culture: NO GROWTH

## 2011-07-06 LAB — URINE MICROSCOPIC-ADD ON

## 2011-07-06 LAB — PREPARE FRESH FROZEN PLASMA

## 2011-07-06 LAB — PLATELET COUNT: Platelets: 84 — ABNORMAL LOW

## 2011-07-06 LAB — B-NATRIURETIC PEPTIDE (CONVERTED LAB)
Pro B Natriuretic peptide (BNP): 586 — ABNORMAL HIGH
Pro B Natriuretic peptide (BNP): 594 — ABNORMAL HIGH

## 2011-07-06 LAB — GLUCOSE, CAPILLARY
Glucose-Capillary: 101 — ABNORMAL HIGH
Glucose-Capillary: 109 — ABNORMAL HIGH
Glucose-Capillary: 110 — ABNORMAL HIGH
Glucose-Capillary: 123 — ABNORMAL HIGH
Glucose-Capillary: 124 — ABNORMAL HIGH
Glucose-Capillary: 127 — ABNORMAL HIGH
Glucose-Capillary: 127 — ABNORMAL HIGH
Glucose-Capillary: 83

## 2011-07-06 LAB — BLOOD GAS, ARTERIAL
Acid-Base Excess: 4 — ABNORMAL HIGH
Bicarbonate: 27.5 — ABNORMAL HIGH
FIO2: 0.21
O2 Saturation: 95.2
Patient temperature: 98.6
TCO2: 28.6
pCO2 arterial: 37.6
pH, Arterial: 7.477 — ABNORMAL HIGH
pO2, Arterial: 70.4 — ABNORMAL LOW

## 2011-07-06 LAB — LIPASE, BLOOD: Lipase: 21

## 2011-07-06 LAB — CULTURE, BAL-QUANTITATIVE W GRAM STAIN: Culture: NO GROWTH

## 2011-07-06 LAB — URINE CULTURE
Colony Count: >=100000
Culture: NO GROWTH

## 2011-07-06 LAB — PREPARE PLATELET PHERESIS

## 2011-07-06 LAB — OCCULT BLOOD X 1 CARD TO LAB, STOOL: Fecal Occult Bld: NEGATIVE

## 2011-07-06 LAB — DIFFERENTIAL
Basophils Absolute: 0.1
Basophils Relative: 1
Eosinophils Absolute: 0.1
Eosinophils Relative: 1
Monocytes Absolute: 1

## 2011-07-06 LAB — POCT CARDIAC MARKERS
CKMB, poc: 3.4
Troponin i, poc: <0.05

## 2011-07-06 LAB — POCT I-STAT, CHEM 8
BUN: 8
Hemoglobin: 9.9 — ABNORMAL LOW
Sodium: 146 — ABNORMAL HIGH
TCO2: 24

## 2011-07-06 LAB — CLOSTRIDIUM DIFFICILE EIA: C difficile Toxins A+B, EIA: NEGATIVE

## 2011-07-06 LAB — CK TOTAL AND CKMB (NOT AT ARMC): CK, MB: 3.5

## 2011-07-06 LAB — CREATININE, SERUM
Creatinine, Ser: 0.62
Creatinine, Ser: 0.63
GFR calc Af Amer: >60
GFR calc Af Amer: >60
GFR calc non Af Amer: >60
GFR calc non Af Amer: >60

## 2011-07-06 LAB — MAGNESIUM
Magnesium: 2.6 — ABNORMAL HIGH
Magnesium: 2.8 — ABNORMAL HIGH
Magnesium: 3.3 — ABNORMAL HIGH

## 2011-07-09 ENCOUNTER — Encounter (HOSPITAL_COMMUNITY): Payer: Self-pay | Attending: Cardiology

## 2011-07-09 DIAGNOSIS — Z8674 Personal history of sudden cardiac arrest: Secondary | ICD-10-CM | POA: Insufficient documentation

## 2011-07-09 DIAGNOSIS — I501 Left ventricular failure: Secondary | ICD-10-CM | POA: Insufficient documentation

## 2011-07-09 DIAGNOSIS — I472 Ventricular tachycardia, unspecified: Secondary | ICD-10-CM | POA: Insufficient documentation

## 2011-07-09 DIAGNOSIS — I059 Rheumatic mitral valve disease, unspecified: Secondary | ICD-10-CM | POA: Insufficient documentation

## 2011-07-09 DIAGNOSIS — Z7901 Long term (current) use of anticoagulants: Secondary | ICD-10-CM | POA: Insufficient documentation

## 2011-07-09 DIAGNOSIS — Z950 Presence of cardiac pacemaker: Secondary | ICD-10-CM | POA: Insufficient documentation

## 2011-07-09 DIAGNOSIS — I2510 Atherosclerotic heart disease of native coronary artery without angina pectoris: Secondary | ICD-10-CM | POA: Insufficient documentation

## 2011-07-09 DIAGNOSIS — D890 Polyclonal hypergammaglobulinemia: Secondary | ICD-10-CM | POA: Insufficient documentation

## 2011-07-09 DIAGNOSIS — I4729 Other ventricular tachycardia: Secondary | ICD-10-CM | POA: Insufficient documentation

## 2011-07-09 DIAGNOSIS — Z5189 Encounter for other specified aftercare: Secondary | ICD-10-CM | POA: Insufficient documentation

## 2011-07-09 DIAGNOSIS — Z7982 Long term (current) use of aspirin: Secondary | ICD-10-CM | POA: Insufficient documentation

## 2011-07-09 DIAGNOSIS — I252 Old myocardial infarction: Secondary | ICD-10-CM | POA: Insufficient documentation

## 2011-07-09 DIAGNOSIS — R092 Respiratory arrest: Secondary | ICD-10-CM | POA: Insufficient documentation

## 2011-07-09 DIAGNOSIS — Z9861 Coronary angioplasty status: Secondary | ICD-10-CM | POA: Insufficient documentation

## 2011-07-09 DIAGNOSIS — I959 Hypotension, unspecified: Secondary | ICD-10-CM | POA: Insufficient documentation

## 2011-07-09 DIAGNOSIS — D509 Iron deficiency anemia, unspecified: Secondary | ICD-10-CM | POA: Insufficient documentation

## 2011-07-09 DIAGNOSIS — Z951 Presence of aortocoronary bypass graft: Secondary | ICD-10-CM | POA: Insufficient documentation

## 2011-07-09 DIAGNOSIS — I447 Left bundle-branch block, unspecified: Secondary | ICD-10-CM | POA: Insufficient documentation

## 2011-07-09 DIAGNOSIS — I509 Heart failure, unspecified: Secondary | ICD-10-CM | POA: Insufficient documentation

## 2011-07-09 DIAGNOSIS — I2589 Other forms of chronic ischemic heart disease: Secondary | ICD-10-CM | POA: Insufficient documentation

## 2011-07-09 DIAGNOSIS — F329 Major depressive disorder, single episode, unspecified: Secondary | ICD-10-CM | POA: Insufficient documentation

## 2011-07-09 DIAGNOSIS — E785 Hyperlipidemia, unspecified: Secondary | ICD-10-CM | POA: Insufficient documentation

## 2011-07-09 DIAGNOSIS — Z9889 Other specified postprocedural states: Secondary | ICD-10-CM | POA: Insufficient documentation

## 2011-07-09 DIAGNOSIS — I4891 Unspecified atrial fibrillation: Secondary | ICD-10-CM | POA: Insufficient documentation

## 2011-07-09 DIAGNOSIS — F3289 Other specified depressive episodes: Secondary | ICD-10-CM | POA: Insufficient documentation

## 2011-07-09 DIAGNOSIS — I2789 Other specified pulmonary heart diseases: Secondary | ICD-10-CM | POA: Insufficient documentation

## 2011-07-11 ENCOUNTER — Ambulatory Visit (INDEPENDENT_AMBULATORY_CARE_PROVIDER_SITE_OTHER): Payer: Medicare Other | Admitting: *Deleted

## 2011-07-11 ENCOUNTER — Encounter (HOSPITAL_COMMUNITY): Payer: Self-pay

## 2011-07-11 DIAGNOSIS — I4891 Unspecified atrial fibrillation: Secondary | ICD-10-CM

## 2011-07-11 DIAGNOSIS — Z7901 Long term (current) use of anticoagulants: Secondary | ICD-10-CM

## 2011-07-11 LAB — POCT INR: INR: 2.2

## 2011-07-13 ENCOUNTER — Encounter (HOSPITAL_COMMUNITY): Payer: Self-pay

## 2011-07-16 ENCOUNTER — Encounter (HOSPITAL_COMMUNITY): Payer: Self-pay

## 2011-07-17 LAB — CBC
HCT: 37.9
Hemoglobin: 12.6
RBC: 4.09
WBC: 16.9 — ABNORMAL HIGH

## 2011-07-17 LAB — BASIC METABOLIC PANEL
BUN: 25 — ABNORMAL HIGH
Chloride: 108
GFR calc non Af Amer: 46 — ABNORMAL LOW
Glucose, Bld: 149 — ABNORMAL HIGH
Potassium: 3.7

## 2011-07-17 LAB — CARDIAC PANEL(CRET KIN+CKTOT+MB+TROPI)
CK, MB: 2.6
CK, MB: 2.9
Total CK: 90

## 2011-07-17 LAB — PROTIME-INR
INR: 2.5 — ABNORMAL HIGH
Prothrombin Time: 27.5 — ABNORMAL HIGH

## 2011-07-18 ENCOUNTER — Encounter (HOSPITAL_COMMUNITY): Payer: Self-pay

## 2011-07-18 LAB — URINALYSIS, ROUTINE W REFLEX MICROSCOPIC
Bilirubin Urine: NEGATIVE
Ketones, ur: NEGATIVE
Leukocytes, UA: NEGATIVE
Nitrite: NEGATIVE
Protein, ur: NEGATIVE
Urobilinogen, UA: 0.2
pH: 5

## 2011-07-18 LAB — DIFFERENTIAL
Basophils Absolute: 0.1
Basophils Relative: 0
Eosinophils Relative: 0
Monocytes Absolute: 1.3 — ABNORMAL HIGH
Monocytes Relative: 5

## 2011-07-18 LAB — BASIC METABOLIC PANEL
BUN: 17
CO2: 21
Chloride: 106
Creatinine, Ser: 0.92

## 2011-07-18 LAB — POCT I-STAT 3, ART BLOOD GAS (G3+)
Bicarbonate: 24.2 — ABNORMAL HIGH
O2 Saturation: 78
Operator id: 297571
Patient temperature: 37
Patient temperature: 37
TCO2: 21
pCO2 arterial: 32 — ABNORMAL LOW
pH, Arterial: 7.41 — ABNORMAL HIGH
pH, Arterial: 7.431 — ABNORMAL HIGH

## 2011-07-18 LAB — D-DIMER, QUANTITATIVE: D-Dimer, Quant: 0.99 — ABNORMAL HIGH

## 2011-07-18 LAB — URINE CULTURE
Colony Count: NO GROWTH
Culture: NO GROWTH

## 2011-07-18 LAB — TROPONIN I: Troponin I: 0.06

## 2011-07-18 LAB — CULTURE, BLOOD (ROUTINE X 2): Culture: NO GROWTH

## 2011-07-18 LAB — POCT CARDIAC MARKERS
Operator id: 270111
Troponin i, poc: <0.05

## 2011-07-18 LAB — CK TOTAL AND CKMB (NOT AT ARMC)
CK, MB: 1.3
Relative Index: INVALID
Total CK: 54

## 2011-07-18 LAB — CBC
HCT: 47.2 — ABNORMAL HIGH
Hemoglobin: 15.6 — ABNORMAL HIGH
MCHC: 33.2
RBC: 5.12 — ABNORMAL HIGH
RDW: 14.5 — ABNORMAL HIGH

## 2011-07-18 LAB — CARDIAC PANEL(CRET KIN+CKTOT+MB+TROPI)
Relative Index: INVALID
Troponin I: 0.06

## 2011-07-18 LAB — URINE MICROSCOPIC-ADD ON

## 2011-07-20 ENCOUNTER — Encounter (HOSPITAL_COMMUNITY): Payer: Self-pay

## 2011-07-23 ENCOUNTER — Encounter (HOSPITAL_COMMUNITY): Payer: Self-pay

## 2011-07-25 ENCOUNTER — Encounter (HOSPITAL_COMMUNITY): Payer: Self-pay

## 2011-07-26 LAB — PROTIME-INR
INR: 1.1
INR: 1.1
INR: 1.3
INR: 2 — ABNORMAL HIGH
Prothrombin Time: 16.4 — ABNORMAL HIGH
Prothrombin Time: 23.5 — ABNORMAL HIGH

## 2011-07-26 LAB — COMPREHENSIVE METABOLIC PANEL
ALT: 17
ALT: 28
AST: 54 — ABNORMAL HIGH
Albumin: 2.8 — ABNORMAL LOW
Alkaline Phosphatase: 56
BUN: 9
CO2: 24
Calcium: 8.7
Chloride: 107
Creatinine, Ser: 0.71
GFR calc Af Amer: >60
GFR calc non Af Amer: >60
Glucose, Bld: 113 — ABNORMAL HIGH
Potassium: 4.5
Sodium: 140
Total Bilirubin: 1
Total Protein: 6.1

## 2011-07-26 LAB — BASIC METABOLIC PANEL
BUN: 11
BUN: 11
BUN: 13
CO2: 21
Calcium: 8.5
Calcium: 8.7
Calcium: 8.7
Calcium: 9.1
Chloride: 105
Creatinine, Ser: 0.54
Creatinine, Ser: 0.65
Creatinine, Ser: 0.84
GFR calc Af Amer: >60
GFR calc Af Amer: >60
GFR calc non Af Amer: >60
GFR calc non Af Amer: >60
GFR calc non Af Amer: >60
GFR calc non Af Amer: >60
Glucose, Bld: 90
Glucose, Bld: 98
Potassium: 3.2 — ABNORMAL LOW
Potassium: 3.7
Potassium: 3.9
Sodium: 138
Sodium: 141

## 2011-07-26 LAB — I-STAT EC8
BUN: 11
Chloride: 107
HCT: 49 — ABNORMAL HIGH
Hemoglobin: 16.7 — ABNORMAL HIGH
Operator id: 245131
Sodium: 138

## 2011-07-26 LAB — DIFFERENTIAL
Basophils Relative: 0
Eosinophils Absolute: 0
Eosinophils Relative: 0
Lymphs Abs: 2.7
Monocytes Relative: 5

## 2011-07-26 LAB — POCT I-STAT 3, ART BLOOD GAS (G3+)
Operator id: 245131
Patient temperature: 98.6
TCO2: 20
pH, Arterial: 7.305 — ABNORMAL LOW

## 2011-07-26 LAB — BLOOD GAS, ARTERIAL
MECHVT: 450
O2 Saturation: 99.4
PEEP: 0.5
RATE: 14
pO2, Arterial: 151 — ABNORMAL HIGH

## 2011-07-26 LAB — CBC
HCT: 42.8
HCT: 46.8 — ABNORMAL HIGH
HCT: 48.3 — ABNORMAL HIGH
HCT: 48.6 — ABNORMAL HIGH
Hemoglobin: 13.4
Hemoglobin: 14.4
MCHC: 32.6
MCHC: 33.2
MCHC: 33.4
MCHC: 34.1
MCV: 92.1
MCV: 93.1
MCV: 93.6
Platelets: 230
Platelets: 231
Platelets: 234
Platelets: 240
Platelets: 253
RBC: 4.15
RBC: 4.69
RBC: 5.18 — ABNORMAL HIGH
RBC: 5.19 — ABNORMAL HIGH
RDW: 13.1
RDW: 13.3
WBC: 10.5
WBC: 14.3 — ABNORMAL HIGH
WBC: 16.7 — ABNORMAL HIGH
WBC: 8

## 2011-07-26 LAB — B-NATRIURETIC PEPTIDE (CONVERTED LAB)
Pro B Natriuretic peptide (BNP): 123 — ABNORMAL HIGH
Pro B Natriuretic peptide (BNP): 123 — ABNORMAL HIGH
Pro B Natriuretic peptide (BNP): 144 — ABNORMAL HIGH
Pro B Natriuretic peptide (BNP): 327 — ABNORMAL HIGH
Pro B Natriuretic peptide (BNP): 450 — ABNORMAL HIGH
Pro B Natriuretic peptide (BNP): 636 — ABNORMAL HIGH

## 2011-07-26 LAB — CARDIAC PANEL(CRET KIN+CKTOT+MB+TROPI)
CK, MB: 14.2 — ABNORMAL HIGH
Relative Index: 6.3 — ABNORMAL HIGH
Total CK: 273 — ABNORMAL HIGH

## 2011-07-26 LAB — POCT CARDIAC MARKERS: Operator id: 277751

## 2011-07-26 LAB — SEDIMENTATION RATE: Sed Rate: 30 — ABNORMAL HIGH

## 2011-07-26 LAB — APTT: aPTT: 38 — ABNORMAL HIGH

## 2011-07-26 LAB — DIGOXIN LEVEL: Digoxin Level: 0.7 — ABNORMAL LOW

## 2011-07-26 LAB — LACTIC ACID, PLASMA: Lactic Acid, Venous: 1.6

## 2011-07-27 ENCOUNTER — Other Ambulatory Visit: Payer: Self-pay | Admitting: Cardiology

## 2011-07-27 ENCOUNTER — Encounter (HOSPITAL_COMMUNITY): Payer: Self-pay

## 2011-07-30 ENCOUNTER — Encounter (HOSPITAL_COMMUNITY): Payer: Self-pay

## 2011-08-01 ENCOUNTER — Encounter (HOSPITAL_COMMUNITY): Payer: Self-pay

## 2011-08-02 ENCOUNTER — Telehealth: Payer: Self-pay | Admitting: Cardiology

## 2011-08-02 NOTE — Telephone Encounter (Signed)
Patient c/o problems with leg cramps, cold all the time, white tongue at times, and allergies.  Concerned may coming from Tikosyn. Advised Dr Swaziland out of office but would have Synetta Fail RN call tomorrow.

## 2011-08-02 NOTE — Telephone Encounter (Signed)
Pt has medicine questions

## 2011-08-03 ENCOUNTER — Encounter (HOSPITAL_COMMUNITY): Payer: Self-pay

## 2011-08-03 NOTE — Telephone Encounter (Signed)
Called yesterday stating she has been having muscle cramps, "charlie horses" in legs over the past 2 wks. Thinks it could be Tikosyn. Advised Dr. Swaziland is out of town for a week. Asked her if she wanted to call Dr. Duanne Guess or see our NP on Mon. She will come in [place] to see Lawson Fiscal.

## 2011-08-06 ENCOUNTER — Ambulatory Visit (INDEPENDENT_AMBULATORY_CARE_PROVIDER_SITE_OTHER): Payer: Medicare Other | Admitting: Nurse Practitioner

## 2011-08-06 ENCOUNTER — Ambulatory Visit (INDEPENDENT_AMBULATORY_CARE_PROVIDER_SITE_OTHER): Payer: Medicare Other | Admitting: *Deleted

## 2011-08-06 ENCOUNTER — Telehealth: Payer: Self-pay | Admitting: *Deleted

## 2011-08-06 ENCOUNTER — Encounter: Payer: Self-pay | Admitting: Nurse Practitioner

## 2011-08-06 ENCOUNTER — Encounter (HOSPITAL_COMMUNITY): Payer: Self-pay

## 2011-08-06 VITALS — BP 104/72 | HR 68 | Ht 65.0 in | Wt 151.0 lb

## 2011-08-06 DIAGNOSIS — Z7901 Long term (current) use of anticoagulants: Secondary | ICD-10-CM

## 2011-08-06 DIAGNOSIS — I4891 Unspecified atrial fibrillation: Secondary | ICD-10-CM

## 2011-08-06 DIAGNOSIS — I2510 Atherosclerotic heart disease of native coronary artery without angina pectoris: Secondary | ICD-10-CM

## 2011-08-06 DIAGNOSIS — R69 Illness, unspecified: Secondary | ICD-10-CM

## 2011-08-06 DIAGNOSIS — R6889 Other general symptoms and signs: Secondary | ICD-10-CM | POA: Insufficient documentation

## 2011-08-06 DIAGNOSIS — R252 Cramp and spasm: Secondary | ICD-10-CM

## 2011-08-06 LAB — BASIC METABOLIC PANEL
BUN: 15 mg/dL (ref 6–23)
CO2: 27 mEq/L (ref 19–32)
Calcium: 9.2 mg/dL (ref 8.4–10.5)
Chloride: 105 mEq/L (ref 96–112)
Creatinine, Ser: 0.8 mg/dL (ref 0.4–1.2)
GFR: 71.42 mL/min (ref >60.00)
Glucose, Bld: 118 mg/dL — ABNORMAL HIGH (ref 70–99)
Potassium: 4 mEq/L (ref 3.5–5.1)
Sodium: 140 mEq/L (ref 135–145)

## 2011-08-06 LAB — MAGNESIUM: Magnesium: 2.2 mg/dL (ref 1.5–2.5)

## 2011-08-06 NOTE — Telephone Encounter (Signed)
Message copied by Lorayne Bender on Mon Aug 06, 2011  5:44 PM ------      Message from: [name]      Created: Mon Aug 06, 2011  2:45 PM       Ok to report. Labs are satisfactory. We will see how she does off the Crestor for the next month.

## 2011-08-06 NOTE — Progress Notes (Signed)
[name] Date of Birth: 12/16/1929 Medical Record #[id number]  History of Present Illness: [name] is seen today for a work in visit. She is seen for Dr. Swaziland. She is here with several complaints. Her calf muscles have been sore over the past week. She is waking up with "Charlie horses" at night. She has not tried any home remedies. Some of this may be like what she had with Lipitor.  Her tongue is white. She does not know why. She is upset over skin changes. She remains very depressed. No chest pain or shortness of breath. She tries to go to rehab but really just stays at home. She is not interested in anti depressant therapy. She seems very unengaged with life.  Current Outpatient Prescriptions on File Prior to Visit  Medication Sig Dispense Refill  . Cholecalciferol (VITAMIN D) 2000 UNITS tablet Take 2,000 Units by mouth daily.        . digoxin (LANOXIN) 0.125 MG tablet TAKE 1 TABLET (125 MCG TOTAL) BY MOUTH DAILY.  30 tablet  5  . dofetilide (TIKOSYN) 250 MCG capsule Take 250 mcg by mouth 2 (two) times daily.        Marland Kitchen LORazepam (ATIVAN) 0.5 MG tablet Take 0.5 mg by mouth every 8 (eight) hours as needed.        . metoprolol tartrate (LOPRESSOR) 25 MG tablet TAKE 1/2 TABLET BY MOUTH TWICE A DAY  30 tablet  5  . pantoprazole (PROTONIX) 40 MG tablet TAKE 1 TABLET BY MOUTH EVERY DAY  30 tablet  5  . rosuvastatin (CRESTOR) 20 MG tablet Take 10 mg by mouth daily.       Marland Kitchen spironolactone (ALDACTONE) 25 MG tablet Take 1 tablet (25 mg total) by mouth daily.  30 tablet  11  . warfarin (COUMADIN) 1 MG tablet TAKE 4 TABLETS DAILY FOR 3 DAYS OF EACH WEEK AND 2 TABLETS DAILY FOR 4 DAYS OF EACH WEEK  104 tablet  3  . warfarin (COUMADIN) 5 MG tablet Take 5 mg by mouth as directed.          Allergies  Allergen Reactions  . Amiodarone   . Benadryl Allergy     Past Medical History  Diagnosis Date  . Coronary artery disease     Prior stent to RCA in 1996 and 2008; s/p CAB in 2009 with MV  repair and Maze  . Atrial fibrillation     in sinus on Tikosyn  . CHF (congestive heart failure)     EF 35% per echo in 2009  . Hypertension   . Anxiety   . Depression   . Hyperlipidemia   . Mitral insufficiency     s/p MV repair 2009  . IDA (iron deficiency anemia)   . Subarachnoid hemorrhage   . LBBB (left bundle branch block)   . Chronic anticoagulation   . High risk medication use     Tikosyn    Past Surgical History  Procedure Date  . Cardiac catheterization 03/19/2007    EF 45%. THERE IS 2+ MITRAL INSUFFICIENCY. THERE IS MODERATE INFERIOR WALL HYPOKINESIA WITH OVERALL MILD TO MODERATE LV DYSFUNCTION  . Coronary stent placement 1996, AND 03/2007    RIGHT CORONARY  . Removal of colon polyp     AND A LARGE CECAL POLYP  . Coronary artery bypass graft 04/2008    INCLUDING A MITRAL VALVE REPAIR AND A LEFT SIDED MAZE PROCEDURE  . Coronary artery bypass graft  X2 with LIMA to LAD and SVG to PD and WITH A LEFT SIDED MAZE PROCEDURE & MITRAL VALVE REPAIR. THIS INCLUDED AN LIMA GRAFT TO THE LAD, AND A SAPHENOUS VEIN GRAFT TO THE PDA.  Marland Kitchen Transthoracic echocardiogram 06/21/2008    EF 35%  . Cardiac pacemaker placement   . Mitral valve repair     History  Smoking status  . Former Smoker  . Quit date: 05/06/1996  Smokeless tobacco  . Not on file    History  Alcohol Use No    Family History  Problem Relation Age of Onset  . Heart attack Mother   . Cancer Father     Review of Systems: The review of systems is per the HPI. No cardiac complaints.  All other systems were reviewed and are negative.  Physical Exam: BP 104/72  Pulse 68  Ht 5\' 5"  (1.651 m)  Wt 151 lb (68.493 kg)  BMI 25.13 kg/m2 Patient is very depressed but in no acute distress. Mood and affect is quite flat.  Skin is warm and dry. Color is normal.  HEENT is unremarkable. Normocephalic/atraumatic. PERRL. Sclera are nonicteric. Neck is supple. No masses. No JVD. Lungs are clear. Cardiac exam shows a  regular rate and rhythm. Soft systolic murmur noted. No gallop. Abdomen is soft. Extremities are without edema. Gait and ROM are intact. No gross neurologic deficits noted.  LABORATORY DATA: BMET and Mg pending.   Assessment / Plan:

## 2011-08-06 NOTE — Telephone Encounter (Signed)
Notified of lab results. Will send to Dr. Duanne Guess

## 2011-08-06 NOTE — Assessment & Plan Note (Signed)
She remains in sinus on Tikosyn. Has pacemaker in place and is followed in device clinic.

## 2011-08-06 NOTE — Assessment & Plan Note (Signed)
Currently no symptoms.   

## 2011-08-06 NOTE — Assessment & Plan Note (Signed)
I think most of these complaints are driven by her depression. I did stop her Crestor for one month. She will call [facility] after Thanksgiving to let us know how she is doing. We will check BMET and MG levels today. I do not have an answer for her tongue and to me it looks fine. She will make an appointment to see dermatology. For now, I will have her see Dr. Swaziland as scheduled in February. Patient is agreeable to this plan and will call if any problems develop in the interim.

## 2011-08-06 NOTE — Patient Instructions (Signed)
Stop taking your Crestor for the next one month. Call in one month (end of November) and let Synetta Fail know how you are doing.   We are going to check some labs today to look at your potassium level and magnesium.

## 2011-08-08 ENCOUNTER — Encounter: Payer: Medicare Other | Admitting: *Deleted

## 2011-08-08 ENCOUNTER — Encounter (HOSPITAL_COMMUNITY): Payer: Self-pay

## 2011-08-10 ENCOUNTER — Encounter (HOSPITAL_COMMUNITY): Payer: Self-pay

## 2011-08-13 ENCOUNTER — Encounter (HOSPITAL_COMMUNITY): Payer: Self-pay

## 2011-08-13 DIAGNOSIS — I252 Old myocardial infarction: Secondary | ICD-10-CM | POA: Insufficient documentation

## 2011-08-13 DIAGNOSIS — E785 Hyperlipidemia, unspecified: Secondary | ICD-10-CM | POA: Insufficient documentation

## 2011-08-13 DIAGNOSIS — Z5189 Encounter for other specified aftercare: Secondary | ICD-10-CM | POA: Insufficient documentation

## 2011-08-13 DIAGNOSIS — I2789 Other specified pulmonary heart diseases: Secondary | ICD-10-CM | POA: Insufficient documentation

## 2011-08-13 DIAGNOSIS — F3289 Other specified depressive episodes: Secondary | ICD-10-CM | POA: Insufficient documentation

## 2011-08-13 DIAGNOSIS — I2510 Atherosclerotic heart disease of native coronary artery without angina pectoris: Secondary | ICD-10-CM | POA: Insufficient documentation

## 2011-08-13 DIAGNOSIS — I447 Left bundle-branch block, unspecified: Secondary | ICD-10-CM | POA: Insufficient documentation

## 2011-08-13 DIAGNOSIS — Z9889 Other specified postprocedural states: Secondary | ICD-10-CM | POA: Insufficient documentation

## 2011-08-13 DIAGNOSIS — I4729 Other ventricular tachycardia: Secondary | ICD-10-CM | POA: Insufficient documentation

## 2011-08-13 DIAGNOSIS — I472 Ventricular tachycardia, unspecified: Secondary | ICD-10-CM | POA: Insufficient documentation

## 2011-08-13 DIAGNOSIS — Z8674 Personal history of sudden cardiac arrest: Secondary | ICD-10-CM | POA: Insufficient documentation

## 2011-08-13 DIAGNOSIS — I959 Hypotension, unspecified: Secondary | ICD-10-CM | POA: Insufficient documentation

## 2011-08-13 DIAGNOSIS — Z950 Presence of cardiac pacemaker: Secondary | ICD-10-CM | POA: Insufficient documentation

## 2011-08-13 DIAGNOSIS — Z9861 Coronary angioplasty status: Secondary | ICD-10-CM | POA: Insufficient documentation

## 2011-08-13 DIAGNOSIS — F329 Major depressive disorder, single episode, unspecified: Secondary | ICD-10-CM | POA: Insufficient documentation

## 2011-08-13 DIAGNOSIS — I4891 Unspecified atrial fibrillation: Secondary | ICD-10-CM | POA: Insufficient documentation

## 2011-08-13 DIAGNOSIS — I509 Heart failure, unspecified: Secondary | ICD-10-CM | POA: Insufficient documentation

## 2011-08-13 DIAGNOSIS — Z7901 Long term (current) use of anticoagulants: Secondary | ICD-10-CM | POA: Insufficient documentation

## 2011-08-13 DIAGNOSIS — D509 Iron deficiency anemia, unspecified: Secondary | ICD-10-CM | POA: Insufficient documentation

## 2011-08-13 DIAGNOSIS — Z7982 Long term (current) use of aspirin: Secondary | ICD-10-CM | POA: Insufficient documentation

## 2011-08-13 DIAGNOSIS — Z951 Presence of aortocoronary bypass graft: Secondary | ICD-10-CM | POA: Insufficient documentation

## 2011-08-13 DIAGNOSIS — I2589 Other forms of chronic ischemic heart disease: Secondary | ICD-10-CM | POA: Insufficient documentation

## 2011-08-13 DIAGNOSIS — D890 Polyclonal hypergammaglobulinemia: Secondary | ICD-10-CM | POA: Insufficient documentation

## 2011-08-13 DIAGNOSIS — I059 Rheumatic mitral valve disease, unspecified: Secondary | ICD-10-CM | POA: Insufficient documentation

## 2011-08-13 DIAGNOSIS — I501 Left ventricular failure: Secondary | ICD-10-CM | POA: Insufficient documentation

## 2011-08-13 DIAGNOSIS — R092 Respiratory arrest: Secondary | ICD-10-CM | POA: Insufficient documentation

## 2011-08-15 ENCOUNTER — Encounter (HOSPITAL_COMMUNITY): Payer: Self-pay

## 2011-08-17 ENCOUNTER — Encounter (HOSPITAL_COMMUNITY): Payer: Self-pay

## 2011-08-20 ENCOUNTER — Encounter (HOSPITAL_COMMUNITY): Payer: Self-pay

## 2011-08-22 ENCOUNTER — Encounter (HOSPITAL_COMMUNITY): Payer: Self-pay

## 2011-08-24 ENCOUNTER — Encounter (HOSPITAL_COMMUNITY): Payer: Self-pay

## 2011-08-27 ENCOUNTER — Encounter (HOSPITAL_COMMUNITY): Payer: Self-pay

## 2011-08-29 ENCOUNTER — Encounter (HOSPITAL_COMMUNITY): Payer: Self-pay

## 2011-08-31 ENCOUNTER — Telehealth: Payer: Self-pay | Admitting: Cardiology

## 2011-08-31 ENCOUNTER — Encounter (HOSPITAL_COMMUNITY): Payer: Self-pay

## 2011-08-31 NOTE — Telephone Encounter (Signed)
LMOM for pt to call back if she still has questions about her medications. Or she can contact her prescribing pharmacy about any questions.

## 2011-08-31 NOTE — Telephone Encounter (Signed)
Pt calling re change in med after picking up from pharmacy, for wafarin

## 2011-09-03 ENCOUNTER — Encounter (HOSPITAL_COMMUNITY): Payer: Self-pay

## 2011-09-03 ENCOUNTER — Ambulatory Visit (INDEPENDENT_AMBULATORY_CARE_PROVIDER_SITE_OTHER): Payer: Medicare Other | Admitting: *Deleted

## 2011-09-03 DIAGNOSIS — I4891 Unspecified atrial fibrillation: Secondary | ICD-10-CM

## 2011-09-03 DIAGNOSIS — Z7901 Long term (current) use of anticoagulants: Secondary | ICD-10-CM

## 2011-09-03 LAB — POCT INR: INR: 1.6

## 2011-09-05 ENCOUNTER — Encounter (HOSPITAL_COMMUNITY)
Admission: RE | Admit: 2011-09-05 | Discharge: 2011-09-05 | Disposition: A | Discharge status: Home / Self Care | Payer: Self-pay | Source: Ambulatory Visit | Attending: Cardiology | Admitting: Cardiology

## 2011-09-07 ENCOUNTER — Encounter (HOSPITAL_COMMUNITY)
Admission: RE | Admit: 2011-09-07 | Discharge: 2011-09-07 | Disposition: A | Discharge status: Home / Self Care | Payer: Self-pay | Source: Ambulatory Visit | Attending: Cardiology | Admitting: Cardiology

## 2011-09-10 ENCOUNTER — Encounter (HOSPITAL_COMMUNITY)
Admission: RE | Admit: 2011-09-10 | Discharge: 2011-09-10 | Disposition: A | Discharge status: Home / Self Care | Payer: Self-pay | Source: Ambulatory Visit | Attending: Cardiology | Admitting: Cardiology

## 2011-09-10 DIAGNOSIS — R092 Respiratory arrest: Secondary | ICD-10-CM | POA: Insufficient documentation

## 2011-09-10 DIAGNOSIS — Z7901 Long term (current) use of anticoagulants: Secondary | ICD-10-CM | POA: Insufficient documentation

## 2011-09-10 DIAGNOSIS — Z7982 Long term (current) use of aspirin: Secondary | ICD-10-CM | POA: Insufficient documentation

## 2011-09-10 DIAGNOSIS — I509 Heart failure, unspecified: Secondary | ICD-10-CM | POA: Insufficient documentation

## 2011-09-10 DIAGNOSIS — I959 Hypotension, unspecified: Secondary | ICD-10-CM | POA: Insufficient documentation

## 2011-09-10 DIAGNOSIS — I252 Old myocardial infarction: Secondary | ICD-10-CM | POA: Insufficient documentation

## 2011-09-10 DIAGNOSIS — I2789 Other specified pulmonary heart diseases: Secondary | ICD-10-CM | POA: Insufficient documentation

## 2011-09-10 DIAGNOSIS — D890 Polyclonal hypergammaglobulinemia: Secondary | ICD-10-CM | POA: Insufficient documentation

## 2011-09-10 DIAGNOSIS — I472 Ventricular tachycardia, unspecified: Secondary | ICD-10-CM | POA: Insufficient documentation

## 2011-09-10 DIAGNOSIS — I2589 Other forms of chronic ischemic heart disease: Secondary | ICD-10-CM | POA: Insufficient documentation

## 2011-09-10 DIAGNOSIS — E785 Hyperlipidemia, unspecified: Secondary | ICD-10-CM | POA: Insufficient documentation

## 2011-09-10 DIAGNOSIS — F3289 Other specified depressive episodes: Secondary | ICD-10-CM | POA: Insufficient documentation

## 2011-09-10 DIAGNOSIS — D509 Iron deficiency anemia, unspecified: Secondary | ICD-10-CM | POA: Insufficient documentation

## 2011-09-10 DIAGNOSIS — Z5189 Encounter for other specified aftercare: Secondary | ICD-10-CM | POA: Insufficient documentation

## 2011-09-10 DIAGNOSIS — I2510 Atherosclerotic heart disease of native coronary artery without angina pectoris: Secondary | ICD-10-CM | POA: Insufficient documentation

## 2011-09-10 DIAGNOSIS — Z950 Presence of cardiac pacemaker: Secondary | ICD-10-CM | POA: Insufficient documentation

## 2011-09-10 DIAGNOSIS — I4729 Other ventricular tachycardia: Secondary | ICD-10-CM | POA: Insufficient documentation

## 2011-09-10 DIAGNOSIS — I447 Left bundle-branch block, unspecified: Secondary | ICD-10-CM | POA: Insufficient documentation

## 2011-09-10 DIAGNOSIS — I059 Rheumatic mitral valve disease, unspecified: Secondary | ICD-10-CM | POA: Insufficient documentation

## 2011-09-10 DIAGNOSIS — I501 Left ventricular failure: Secondary | ICD-10-CM | POA: Insufficient documentation

## 2011-09-10 DIAGNOSIS — Z951 Presence of aortocoronary bypass graft: Secondary | ICD-10-CM | POA: Insufficient documentation

## 2011-09-10 DIAGNOSIS — F329 Major depressive disorder, single episode, unspecified: Secondary | ICD-10-CM | POA: Insufficient documentation

## 2011-09-10 DIAGNOSIS — I4891 Unspecified atrial fibrillation: Secondary | ICD-10-CM | POA: Insufficient documentation

## 2011-09-10 DIAGNOSIS — Z9861 Coronary angioplasty status: Secondary | ICD-10-CM | POA: Insufficient documentation

## 2011-09-10 DIAGNOSIS — Z9889 Other specified postprocedural states: Secondary | ICD-10-CM | POA: Insufficient documentation

## 2011-09-10 DIAGNOSIS — Z8674 Personal history of sudden cardiac arrest: Secondary | ICD-10-CM | POA: Insufficient documentation

## 2011-09-12 ENCOUNTER — Encounter (HOSPITAL_COMMUNITY): Payer: Self-pay

## 2011-09-14 ENCOUNTER — Encounter (HOSPITAL_COMMUNITY)
Admission: RE | Admit: 2011-09-14 | Discharge: 2011-09-14 | Disposition: A | Discharge status: Home / Self Care | Payer: Self-pay | Source: Ambulatory Visit | Attending: Cardiology | Admitting: Cardiology

## 2011-09-17 ENCOUNTER — Encounter (HOSPITAL_COMMUNITY)
Admission: RE | Admit: 2011-09-17 | Discharge: 2011-09-17 | Disposition: A | Discharge status: Home / Self Care | Payer: Self-pay | Source: Ambulatory Visit | Attending: Cardiology | Admitting: Cardiology

## 2011-09-19 ENCOUNTER — Encounter (HOSPITAL_COMMUNITY)
Admission: RE | Admit: 2011-09-19 | Discharge: 2011-09-19 | Disposition: A | Discharge status: Home / Self Care | Payer: Self-pay | Source: Ambulatory Visit | Attending: Cardiology | Admitting: Cardiology

## 2011-09-21 ENCOUNTER — Encounter (HOSPITAL_COMMUNITY): Payer: Self-pay

## 2011-09-24 ENCOUNTER — Ambulatory Visit (INDEPENDENT_AMBULATORY_CARE_PROVIDER_SITE_OTHER): Payer: Medicare Other | Admitting: *Deleted

## 2011-09-24 ENCOUNTER — Encounter (HOSPITAL_COMMUNITY)
Admission: RE | Admit: 2011-09-24 | Discharge: 2011-09-24 | Disposition: A | Discharge status: Home / Self Care | Payer: Self-pay | Source: Ambulatory Visit | Attending: Cardiology | Admitting: Cardiology

## 2011-09-24 DIAGNOSIS — Z7901 Long term (current) use of anticoagulants: Secondary | ICD-10-CM

## 2011-09-24 DIAGNOSIS — I4891 Unspecified atrial fibrillation: Secondary | ICD-10-CM

## 2011-09-26 ENCOUNTER — Encounter (HOSPITAL_COMMUNITY)
Admission: RE | Admit: 2011-09-26 | Discharge: 2011-09-26 | Disposition: A | Discharge status: Home / Self Care | Payer: Self-pay | Source: Ambulatory Visit | Attending: Cardiology | Admitting: Cardiology

## 2011-09-27 ENCOUNTER — Other Ambulatory Visit: Payer: Self-pay

## 2011-09-27 MED ORDER — PANTOPRAZOLE SODIUM 40 MG PO TBEC: 40.0000 mg | DELAYED_RELEASE_TABLET | Freq: Every day | ORAL | Status: DC

## 2011-09-28 ENCOUNTER — Telehealth: Payer: Self-pay | Admitting: Cardiology

## 2011-09-28 ENCOUNTER — Encounter (HOSPITAL_COMMUNITY): Payer: Self-pay

## 2011-09-28 ENCOUNTER — Other Ambulatory Visit: Payer: Self-pay | Admitting: *Deleted

## 2011-09-28 MED ORDER — METOPROLOL TARTRATE 25 MG PO TABS: 12.5000 mg | ORAL_TABLET | Freq: Two times a day (BID) | ORAL | Status: DC

## 2011-09-28 NOTE — Telephone Encounter (Signed)
New Problem:    Patient would like to have her metoprolol tartrate (LOPRESSOR) 25 MG tablet refilled and the pharmacy that she normally uses said that it required an authorization. Please call once you have placed the order and feel free to leave a message.

## 2011-10-01 ENCOUNTER — Encounter (HOSPITAL_COMMUNITY): Payer: Self-pay

## 2011-10-03 ENCOUNTER — Encounter (HOSPITAL_COMMUNITY): Payer: Self-pay

## 2011-10-05 ENCOUNTER — Encounter (HOSPITAL_COMMUNITY): Payer: Self-pay

## 2011-10-08 ENCOUNTER — Encounter (HOSPITAL_COMMUNITY): Payer: Self-pay

## 2011-10-10 ENCOUNTER — Encounter (HOSPITAL_COMMUNITY): Payer: Self-pay

## 2011-10-12 ENCOUNTER — Encounter (HOSPITAL_COMMUNITY): Payer: Self-pay

## 2011-10-12 DIAGNOSIS — I472 Ventricular tachycardia, unspecified: Secondary | ICD-10-CM | POA: Insufficient documentation

## 2011-10-12 DIAGNOSIS — I509 Heart failure, unspecified: Secondary | ICD-10-CM | POA: Insufficient documentation

## 2011-10-12 DIAGNOSIS — Z951 Presence of aortocoronary bypass graft: Secondary | ICD-10-CM | POA: Insufficient documentation

## 2011-10-12 DIAGNOSIS — Z7982 Long term (current) use of aspirin: Secondary | ICD-10-CM | POA: Insufficient documentation

## 2011-10-12 DIAGNOSIS — Z8674 Personal history of sudden cardiac arrest: Secondary | ICD-10-CM | POA: Insufficient documentation

## 2011-10-12 DIAGNOSIS — E785 Hyperlipidemia, unspecified: Secondary | ICD-10-CM | POA: Insufficient documentation

## 2011-10-12 DIAGNOSIS — Z950 Presence of cardiac pacemaker: Secondary | ICD-10-CM | POA: Insufficient documentation

## 2011-10-12 DIAGNOSIS — D509 Iron deficiency anemia, unspecified: Secondary | ICD-10-CM | POA: Insufficient documentation

## 2011-10-12 DIAGNOSIS — I4891 Unspecified atrial fibrillation: Secondary | ICD-10-CM | POA: Insufficient documentation

## 2011-10-12 DIAGNOSIS — I447 Left bundle-branch block, unspecified: Secondary | ICD-10-CM | POA: Insufficient documentation

## 2011-10-12 DIAGNOSIS — I2589 Other forms of chronic ischemic heart disease: Secondary | ICD-10-CM | POA: Insufficient documentation

## 2011-10-12 DIAGNOSIS — I2510 Atherosclerotic heart disease of native coronary artery without angina pectoris: Secondary | ICD-10-CM | POA: Insufficient documentation

## 2011-10-12 DIAGNOSIS — F329 Major depressive disorder, single episode, unspecified: Secondary | ICD-10-CM | POA: Insufficient documentation

## 2011-10-12 DIAGNOSIS — I059 Rheumatic mitral valve disease, unspecified: Secondary | ICD-10-CM | POA: Insufficient documentation

## 2011-10-12 DIAGNOSIS — I2789 Other specified pulmonary heart diseases: Secondary | ICD-10-CM | POA: Insufficient documentation

## 2011-10-12 DIAGNOSIS — Z9861 Coronary angioplasty status: Secondary | ICD-10-CM | POA: Insufficient documentation

## 2011-10-12 DIAGNOSIS — D890 Polyclonal hypergammaglobulinemia: Secondary | ICD-10-CM | POA: Insufficient documentation

## 2011-10-12 DIAGNOSIS — Z9889 Other specified postprocedural states: Secondary | ICD-10-CM | POA: Insufficient documentation

## 2011-10-12 DIAGNOSIS — I252 Old myocardial infarction: Secondary | ICD-10-CM | POA: Insufficient documentation

## 2011-10-12 DIAGNOSIS — I959 Hypotension, unspecified: Secondary | ICD-10-CM | POA: Insufficient documentation

## 2011-10-12 DIAGNOSIS — I4729 Other ventricular tachycardia: Secondary | ICD-10-CM | POA: Insufficient documentation

## 2011-10-12 DIAGNOSIS — Z7901 Long term (current) use of anticoagulants: Secondary | ICD-10-CM | POA: Insufficient documentation

## 2011-10-12 DIAGNOSIS — I501 Left ventricular failure: Secondary | ICD-10-CM | POA: Insufficient documentation

## 2011-10-12 DIAGNOSIS — F3289 Other specified depressive episodes: Secondary | ICD-10-CM | POA: Insufficient documentation

## 2011-10-12 DIAGNOSIS — Z5189 Encounter for other specified aftercare: Secondary | ICD-10-CM | POA: Insufficient documentation

## 2011-10-12 DIAGNOSIS — R092 Respiratory arrest: Secondary | ICD-10-CM | POA: Insufficient documentation

## 2011-10-15 ENCOUNTER — Encounter (HOSPITAL_COMMUNITY)
Admission: RE | Admit: 2011-10-15 | Discharge: 2011-10-15 | Disposition: A | Discharge status: Home / Self Care | Payer: Self-pay | Source: Ambulatory Visit | Attending: Cardiology | Admitting: Cardiology

## 2011-10-17 ENCOUNTER — Encounter (HOSPITAL_COMMUNITY)
Admission: RE | Admit: 2011-10-17 | Discharge: 2011-10-17 | Disposition: A | Discharge status: Home / Self Care | Payer: Self-pay | Source: Ambulatory Visit | Attending: Cardiology | Admitting: Cardiology

## 2011-10-18 ENCOUNTER — Encounter: Payer: Self-pay | Admitting: Cardiovascular Disease

## 2011-10-18 NOTE — Telephone Encounter (Signed)
error 

## 2011-10-19 ENCOUNTER — Encounter (HOSPITAL_COMMUNITY)
Admission: RE | Admit: 2011-10-19 | Discharge: 2011-10-19 | Disposition: A | Discharge status: Home / Self Care | Payer: Self-pay | Source: Ambulatory Visit | Attending: Cardiology | Admitting: Cardiology

## 2011-10-22 ENCOUNTER — Ambulatory Visit (INDEPENDENT_AMBULATORY_CARE_PROVIDER_SITE_OTHER): Payer: Medicare Other | Admitting: *Deleted

## 2011-10-22 ENCOUNTER — Encounter (HOSPITAL_COMMUNITY): Payer: Self-pay

## 2011-10-22 DIAGNOSIS — Z7901 Long term (current) use of anticoagulants: Secondary | ICD-10-CM

## 2011-10-22 DIAGNOSIS — I4891 Unspecified atrial fibrillation: Secondary | ICD-10-CM

## 2011-10-22 LAB — POCT INR: INR: 1.9

## 2011-10-24 ENCOUNTER — Encounter (HOSPITAL_COMMUNITY)
Admission: RE | Admit: 2011-10-24 | Discharge: 2011-10-24 | Disposition: A | Discharge status: Home / Self Care | Payer: Self-pay | Source: Ambulatory Visit | Attending: Cardiology | Admitting: Cardiology

## 2011-10-25 ENCOUNTER — Telehealth: Payer: Self-pay | Admitting: Cardiology

## 2011-10-25 ENCOUNTER — Other Ambulatory Visit: Payer: Self-pay

## 2011-10-25 MED ORDER — DOFETILIDE 250 MCG PO CAPS: 250.0000 ug | ORAL_CAPSULE | Freq: Two times a day (BID) | ORAL | Status: DC

## 2011-10-25 NOTE — Telephone Encounter (Signed)
New Problem:     [facility] Rep called today to request some more refills of the patient's dofetilide (TIKOSYN) 250 MCG capsule to be sent in, patient has run out.  Please call back if you have any questions.

## 2011-10-26 ENCOUNTER — Encounter (HOSPITAL_COMMUNITY): Payer: Self-pay

## 2011-10-29 ENCOUNTER — Other Ambulatory Visit (INDEPENDENT_AMBULATORY_CARE_PROVIDER_SITE_OTHER): Payer: Medicare Other | Admitting: *Deleted

## 2011-10-29 ENCOUNTER — Telehealth: Payer: Self-pay | Admitting: Cardiology

## 2011-10-29 ENCOUNTER — Encounter (HOSPITAL_COMMUNITY): Payer: Self-pay

## 2011-10-29 DIAGNOSIS — I4891 Unspecified atrial fibrillation: Secondary | ICD-10-CM

## 2011-10-29 DIAGNOSIS — E7800 Pure hypercholesterolemia, unspecified: Secondary | ICD-10-CM

## 2011-10-29 LAB — BASIC METABOLIC PANEL
GFR: 71.38 mL/min (ref >60.00)
Glucose, Bld: 102 mg/dL — ABNORMAL HIGH (ref 70–99)
Potassium: 3.8 mEq/L (ref 3.5–5.1)
Sodium: 140 mEq/L (ref 135–145)

## 2011-10-29 LAB — LIPID PANEL
HDL: 40.5 mg/dL (ref >39.00)
Total CHOL/HDL Ratio: 5
VLDL: 34.6 mg/dL (ref 0.0–40.0)

## 2011-10-29 LAB — HEPATIC FUNCTION PANEL
AST: 23 U/L (ref 0–37)
Albumin: 3.8 g/dL (ref 3.5–5.2)
Alkaline Phosphatase: 65 U/L (ref 39–117)
Bilirubin, Direct: 0 mg/dL (ref 0.0–0.3)

## 2011-10-29 LAB — LDL CHOLESTEROL, DIRECT: Direct LDL: 144.4 mg/dL

## 2011-10-29 NOTE — Telephone Encounter (Signed)
Patient called,[facility] [address].Spoke to pharmacist, required form for tikosyn was filled out and signed by Dr.Jordan and faxed back to [facility] 10/19/11 and refaxed 10/29/11.Patient will pick up tikosyn this pm.

## 2011-10-29 NOTE — Telephone Encounter (Signed)
Patient called,stated she is almost out of tikosyn.Form for tikosyn faxed back to [facility] 10/19/11 and refaxed 10/29/11.Spoke to pharmacist at [facility] he will send tikosyn in over night mail.

## 2011-10-29 NOTE — Telephone Encounter (Signed)
New Problem   Patient would like a return call from Dr. Illa Level nurse CP regarding medication.  She can be reached at hm#.  Patient very upset

## 2011-10-29 NOTE — Telephone Encounter (Signed)
Fu call Pt is calling about tikosyn refill -[facility] is telling her they have not received the rx. She only has some for today

## 2011-10-31 ENCOUNTER — Encounter (HOSPITAL_COMMUNITY)
Admission: RE | Admit: 2011-10-31 | Discharge: 2011-10-31 | Disposition: A | Discharge status: Home / Self Care | Payer: Self-pay | Source: Ambulatory Visit | Attending: Cardiology | Admitting: Cardiology

## 2011-11-02 ENCOUNTER — Encounter (HOSPITAL_COMMUNITY): Payer: Self-pay

## 2011-11-05 ENCOUNTER — Telehealth: Payer: Self-pay | Admitting: Cardiology

## 2011-11-05 ENCOUNTER — Encounter (HOSPITAL_COMMUNITY): Payer: Self-pay

## 2011-11-05 ENCOUNTER — Encounter: Payer: Self-pay | Admitting: Cardiology

## 2011-11-05 ENCOUNTER — Ambulatory Visit (INDEPENDENT_AMBULATORY_CARE_PROVIDER_SITE_OTHER): Payer: Medicare Other | Admitting: Cardiology

## 2011-11-05 VITALS — BP 104/62 | HR 77 | Ht 65.0 in | Wt 148.0 lb

## 2011-11-05 DIAGNOSIS — I447 Left bundle-branch block, unspecified: Secondary | ICD-10-CM

## 2011-11-05 DIAGNOSIS — E7800 Pure hypercholesterolemia, unspecified: Secondary | ICD-10-CM

## 2011-11-05 DIAGNOSIS — I509 Heart failure, unspecified: Secondary | ICD-10-CM

## 2011-11-05 DIAGNOSIS — E785 Hyperlipidemia, unspecified: Secondary | ICD-10-CM

## 2011-11-05 DIAGNOSIS — I2510 Atherosclerotic heart disease of native coronary artery without angina pectoris: Secondary | ICD-10-CM

## 2011-11-05 DIAGNOSIS — I059 Rheumatic mitral valve disease, unspecified: Secondary | ICD-10-CM

## 2011-11-05 DIAGNOSIS — I5022 Chronic systolic (congestive) heart failure: Secondary | ICD-10-CM

## 2011-11-05 DIAGNOSIS — I4891 Unspecified atrial fibrillation: Secondary | ICD-10-CM

## 2011-11-05 MED ORDER — LORAZEPAM 0.5 MG PO TABS: 0.5000 mg | ORAL_TABLET | Freq: Three times a day (TID) | ORAL | Status: DC | PRN

## 2011-11-05 MED ORDER — EZETIMIBE 10 MG PO TABS: 10.0000 mg | ORAL_TABLET | Freq: Every day | ORAL | Status: DC

## 2011-11-05 NOTE — Progress Notes (Signed)
[name] Date of Birth: September 01, 1930 Medical Record #[id number]  History of Present Illness: Ms. Vales is seen today for a followup visit. She reports that she is feeling well. She is still going to cardiac rehabilitation 3 days a week. Her major complaint is that she is lonely and doesn't have anyone to talk to. She denies any chest pain, shortness of breath, or palpitations. Her muscle aches have resolved since we stopped her statin therapy.  Current Outpatient Prescriptions on File Prior to Visit  Medication Sig Dispense Refill  . Cholecalciferol (VITAMIN D) 2000 UNITS tablet Take 2,000 Units by mouth daily.        . digoxin (LANOXIN) 0.125 MG tablet TAKE 1 TABLET (125 MCG TOTAL) BY MOUTH DAILY.  30 tablet  5  . dofetilide (TIKOSYN) 250 MCG capsule Take 1 capsule (250 mcg total) by mouth 2 (two) times daily.  60 capsule  1  . metoprolol tartrate (LOPRESSOR) 25 MG tablet Take 0.5 tablets (12.5 mg total) by mouth 2 (two) times daily.  30 tablet  5  . pantoprazole (PROTONIX) 40 MG tablet Take 1 tablet (40 mg total) by mouth daily.  30 tablet  10  . warfarin (COUMADIN) 1 MG tablet        . DISCONTD: LORazepam (ATIVAN) 0.5 MG tablet Take 0.5 mg by mouth every 8 (eight) hours as needed.          Allergies  Allergen Reactions  . Amiodarone   . Benadryl Allergy     Past Medical History  Diagnosis Date  . Coronary artery disease     Prior stent to RCA in 1996 and 2008; s/p CAB in 2009 with MV repair and Maze  . Atrial fibrillation     in sinus on Tikosyn  . CHF (congestive heart failure)     EF 35% per echo in 2009  . Hypertension   . Anxiety   . Depression   . Hyperlipidemia   . Mitral insufficiency     s/p MV repair 2009  . IDA (iron deficiency anemia)   . Subarachnoid hemorrhage   . LBBB (left bundle branch block)   . Chronic anticoagulation   . High risk medication use     Tikosyn  . Cardiac pacemaker     Past Surgical History  Procedure Date  . Cardiac  catheterization 03/19/2007    EF 45%. THERE IS 2+ MITRAL INSUFFICIENCY. THERE IS MODERATE INFERIOR WALL HYPOKINESIA WITH OVERALL MILD TO MODERATE LV DYSFUNCTION  . Coronary stent placement 1996, AND 03/2007    RIGHT CORONARY  . Removal of colon polyp     AND A LARGE CECAL POLYP  . Coronary artery bypass graft 04/2008    INCLUDING A MITRAL VALVE REPAIR AND A LEFT SIDED MAZE PROCEDURE  . Coronary artery bypass graft     X2 with LIMA to LAD and SVG to PD and WITH A LEFT SIDED MAZE PROCEDURE & MITRAL VALVE REPAIR. THIS INCLUDED AN LIMA GRAFT TO THE LAD, AND A SAPHENOUS VEIN GRAFT TO THE PDA.  Marland Kitchen Transthoracic echocardiogram 06/21/2008    EF 35%  . Cardiac pacemaker placement   . Mitral valve repair     History  Smoking status  . Former Smoker  . Quit date: 05/06/1996  Smokeless tobacco  . Not on file    History  Alcohol Use No    Family History  Problem Relation Age of Onset  . Heart attack Mother   . Cancer Father  Review of Systems: The review of systems is per the HPI. No cardiac complaints.  All other systems were reviewed and are negative.  Physical Exam: BP 104/62  Pulse 77  Ht 5\' 5"  (1.651 m)  Wt 148 lb (67.132 kg)  BMI 24.63 kg/m2 Patient is very depressed but in no acute distress. Mood and affect is quite flat.  Skin is warm and dry. Color is normal.  HEENT is unremarkable. Normocephalic/atraumatic. PERRL. Sclera are nonicteric. Neck is supple. No masses. No JVD. Lungs are clear. Cardiac exam shows a regular rate and rhythm. Soft systolic murmur noted. No gallop. Abdomen is soft. Extremities are without edema. Gait and ROM are intact. No gross neurologic deficits noted.  LABORATORY DATA: ECG today demonstrates normal sinus rhythm with a left bundle branch block. QTC is 475 ms.   Assessment / Plan:

## 2011-11-05 NOTE — Assessment & Plan Note (Signed)
She is status post remote Maze procedure. She has maintained sinus rhythm very well on Tikosyn. We will continue with her current therapy.

## 2011-11-05 NOTE — Telephone Encounter (Signed)
Patient called was told [facility] [address].

## 2011-11-05 NOTE — Assessment & Plan Note (Signed)
Recent lipid levels are as noted. She is intolerant to statins. We will try her on Zetia 10 mg daily.

## 2011-11-05 NOTE — Patient Instructions (Signed)
Continue your current medication.  We will add Zetia 10 mg daily for your cholesterol.  I will see you again in 6 months.

## 2011-11-05 NOTE — Assessment & Plan Note (Signed)
She has no symptoms of angina. We'll continue with risk factor modification and her current medications.

## 2011-11-05 NOTE — Assessment & Plan Note (Signed)
She is well compensated on current exam. She is no longer on diuretics. Continue sodium restriction.

## 2011-11-05 NOTE — Telephone Encounter (Signed)
New problem Pt said she was here this morning and she was supposed to get rx for zetia 10 mg [facility] [address] She said they do not have it please let her know and you can leave message on her answering machine

## 2011-11-07 ENCOUNTER — Encounter (HOSPITAL_COMMUNITY)
Admission: RE | Admit: 2011-11-07 | Discharge: 2011-11-07 | Disposition: A | Discharge status: Home / Self Care | Payer: Self-pay | Source: Ambulatory Visit | Attending: Cardiology | Admitting: Cardiology

## 2011-11-09 ENCOUNTER — Encounter (HOSPITAL_COMMUNITY)
Admission: RE | Admit: 2011-11-09 | Discharge: 2011-11-09 | Disposition: A | Discharge status: Home / Self Care | Payer: Self-pay | Source: Ambulatory Visit | Attending: Cardiology | Admitting: Cardiology

## 2011-11-09 DIAGNOSIS — R092 Respiratory arrest: Secondary | ICD-10-CM | POA: Insufficient documentation

## 2011-11-09 DIAGNOSIS — I472 Ventricular tachycardia, unspecified: Secondary | ICD-10-CM | POA: Insufficient documentation

## 2011-11-09 DIAGNOSIS — Z951 Presence of aortocoronary bypass graft: Secondary | ICD-10-CM | POA: Insufficient documentation

## 2011-11-09 DIAGNOSIS — Z950 Presence of cardiac pacemaker: Secondary | ICD-10-CM | POA: Insufficient documentation

## 2011-11-09 DIAGNOSIS — I2589 Other forms of chronic ischemic heart disease: Secondary | ICD-10-CM | POA: Insufficient documentation

## 2011-11-09 DIAGNOSIS — Z9889 Other specified postprocedural states: Secondary | ICD-10-CM | POA: Insufficient documentation

## 2011-11-09 DIAGNOSIS — I4729 Other ventricular tachycardia: Secondary | ICD-10-CM | POA: Insufficient documentation

## 2011-11-09 DIAGNOSIS — I2789 Other specified pulmonary heart diseases: Secondary | ICD-10-CM | POA: Insufficient documentation

## 2011-11-09 DIAGNOSIS — I959 Hypotension, unspecified: Secondary | ICD-10-CM | POA: Insufficient documentation

## 2011-11-09 DIAGNOSIS — F329 Major depressive disorder, single episode, unspecified: Secondary | ICD-10-CM | POA: Insufficient documentation

## 2011-11-09 DIAGNOSIS — I447 Left bundle-branch block, unspecified: Secondary | ICD-10-CM | POA: Insufficient documentation

## 2011-11-09 DIAGNOSIS — I2510 Atherosclerotic heart disease of native coronary artery without angina pectoris: Secondary | ICD-10-CM | POA: Insufficient documentation

## 2011-11-09 DIAGNOSIS — I501 Left ventricular failure: Secondary | ICD-10-CM | POA: Insufficient documentation

## 2011-11-09 DIAGNOSIS — D509 Iron deficiency anemia, unspecified: Secondary | ICD-10-CM | POA: Insufficient documentation

## 2011-11-09 DIAGNOSIS — I059 Rheumatic mitral valve disease, unspecified: Secondary | ICD-10-CM | POA: Insufficient documentation

## 2011-11-09 DIAGNOSIS — I252 Old myocardial infarction: Secondary | ICD-10-CM | POA: Insufficient documentation

## 2011-11-09 DIAGNOSIS — F3289 Other specified depressive episodes: Secondary | ICD-10-CM | POA: Insufficient documentation

## 2011-11-09 DIAGNOSIS — Z7901 Long term (current) use of anticoagulants: Secondary | ICD-10-CM | POA: Insufficient documentation

## 2011-11-09 DIAGNOSIS — I509 Heart failure, unspecified: Secondary | ICD-10-CM | POA: Insufficient documentation

## 2011-11-09 DIAGNOSIS — D890 Polyclonal hypergammaglobulinemia: Secondary | ICD-10-CM | POA: Insufficient documentation

## 2011-11-09 DIAGNOSIS — E785 Hyperlipidemia, unspecified: Secondary | ICD-10-CM | POA: Insufficient documentation

## 2011-11-09 DIAGNOSIS — Z7982 Long term (current) use of aspirin: Secondary | ICD-10-CM | POA: Insufficient documentation

## 2011-11-09 DIAGNOSIS — I4891 Unspecified atrial fibrillation: Secondary | ICD-10-CM | POA: Insufficient documentation

## 2011-11-09 DIAGNOSIS — Z9861 Coronary angioplasty status: Secondary | ICD-10-CM | POA: Insufficient documentation

## 2011-11-09 DIAGNOSIS — Z8674 Personal history of sudden cardiac arrest: Secondary | ICD-10-CM | POA: Insufficient documentation

## 2011-11-09 DIAGNOSIS — Z5189 Encounter for other specified aftercare: Secondary | ICD-10-CM | POA: Insufficient documentation

## 2011-11-12 ENCOUNTER — Encounter (HOSPITAL_COMMUNITY)
Admission: RE | Admit: 2011-11-12 | Discharge: 2011-11-12 | Disposition: A | Discharge status: Home / Self Care | Payer: Self-pay | Source: Ambulatory Visit | Attending: Cardiology | Admitting: Cardiology

## 2011-11-12 ENCOUNTER — Ambulatory Visit (INDEPENDENT_AMBULATORY_CARE_PROVIDER_SITE_OTHER): Payer: Medicare Other | Admitting: Pharmacist

## 2011-11-12 DIAGNOSIS — I4891 Unspecified atrial fibrillation: Secondary | ICD-10-CM

## 2011-11-12 DIAGNOSIS — Z7901 Long term (current) use of anticoagulants: Secondary | ICD-10-CM

## 2011-11-12 LAB — POCT INR: INR: 1.7

## 2011-11-14 ENCOUNTER — Encounter (HOSPITAL_COMMUNITY)
Admission: RE | Admit: 2011-11-14 | Discharge: 2011-11-14 | Disposition: A | Discharge status: Home / Self Care | Payer: Self-pay | Source: Ambulatory Visit | Attending: Cardiology | Admitting: Cardiology

## 2011-11-16 ENCOUNTER — Encounter (HOSPITAL_COMMUNITY): Payer: Self-pay

## 2011-11-19 ENCOUNTER — Encounter (HOSPITAL_COMMUNITY): Payer: Self-pay

## 2011-11-21 ENCOUNTER — Encounter (HOSPITAL_COMMUNITY): Payer: Self-pay

## 2011-11-23 ENCOUNTER — Telehealth: Payer: Self-pay | Admitting: Cardiology

## 2011-11-23 ENCOUNTER — Encounter (HOSPITAL_COMMUNITY)
Admission: RE | Admit: 2011-11-23 | Discharge: 2011-11-23 | Disposition: A | Discharge status: Home / Self Care | Payer: Self-pay | Source: Ambulatory Visit | Attending: Cardiology | Admitting: Cardiology

## 2011-11-23 MED ORDER — WARFARIN SODIUM 1 MG PO TABS: ORAL_TABLET | ORAL | Status: DC

## 2011-11-23 NOTE — Telephone Encounter (Signed)
Called spoke with pt advised rx sent to pharmacy as requested.

## 2011-11-23 NOTE — Telephone Encounter (Signed)
New Problem:     Patient called in needing a refill of her warfarin (COUMADIN) 1 MG tablet.

## 2011-11-26 ENCOUNTER — Encounter (HOSPITAL_COMMUNITY): Payer: Self-pay

## 2011-11-27 ENCOUNTER — Other Ambulatory Visit: Payer: Self-pay

## 2011-11-27 MED ORDER — WARFARIN SODIUM 1 MG PO TABS: ORAL_TABLET | ORAL | Status: DC

## 2011-11-28 ENCOUNTER — Encounter (HOSPITAL_COMMUNITY)
Admission: RE | Admit: 2011-11-28 | Discharge: 2011-11-28 | Disposition: A | Discharge status: Home / Self Care | Payer: Self-pay | Source: Ambulatory Visit | Attending: Cardiology | Admitting: Cardiology

## 2011-11-30 ENCOUNTER — Encounter (HOSPITAL_COMMUNITY): Payer: Self-pay

## 2011-11-30 ENCOUNTER — Ambulatory Visit (INDEPENDENT_AMBULATORY_CARE_PROVIDER_SITE_OTHER): Payer: Medicare Other

## 2011-11-30 DIAGNOSIS — I4891 Unspecified atrial fibrillation: Secondary | ICD-10-CM

## 2011-11-30 DIAGNOSIS — Z7901 Long term (current) use of anticoagulants: Secondary | ICD-10-CM

## 2011-11-30 LAB — POCT INR: INR: 2.4

## 2011-12-03 ENCOUNTER — Encounter (HOSPITAL_COMMUNITY): Payer: Medicare Other

## 2011-12-05 ENCOUNTER — Encounter (HOSPITAL_COMMUNITY): Payer: Medicare Other

## 2011-12-07 ENCOUNTER — Encounter (HOSPITAL_COMMUNITY): Payer: Self-pay

## 2011-12-07 DIAGNOSIS — Z951 Presence of aortocoronary bypass graft: Secondary | ICD-10-CM | POA: Insufficient documentation

## 2011-12-07 DIAGNOSIS — I509 Heart failure, unspecified: Secondary | ICD-10-CM | POA: Insufficient documentation

## 2011-12-07 DIAGNOSIS — I4891 Unspecified atrial fibrillation: Secondary | ICD-10-CM | POA: Insufficient documentation

## 2011-12-07 DIAGNOSIS — Z7982 Long term (current) use of aspirin: Secondary | ICD-10-CM | POA: Insufficient documentation

## 2011-12-07 DIAGNOSIS — I2589 Other forms of chronic ischemic heart disease: Secondary | ICD-10-CM | POA: Insufficient documentation

## 2011-12-07 DIAGNOSIS — D509 Iron deficiency anemia, unspecified: Secondary | ICD-10-CM | POA: Insufficient documentation

## 2011-12-07 DIAGNOSIS — I501 Left ventricular failure: Secondary | ICD-10-CM | POA: Insufficient documentation

## 2011-12-07 DIAGNOSIS — I252 Old myocardial infarction: Secondary | ICD-10-CM | POA: Insufficient documentation

## 2011-12-07 DIAGNOSIS — F329 Major depressive disorder, single episode, unspecified: Secondary | ICD-10-CM | POA: Insufficient documentation

## 2011-12-07 DIAGNOSIS — Z8674 Personal history of sudden cardiac arrest: Secondary | ICD-10-CM | POA: Insufficient documentation

## 2011-12-07 DIAGNOSIS — I472 Ventricular tachycardia, unspecified: Secondary | ICD-10-CM | POA: Insufficient documentation

## 2011-12-07 DIAGNOSIS — I959 Hypotension, unspecified: Secondary | ICD-10-CM | POA: Insufficient documentation

## 2011-12-07 DIAGNOSIS — I447 Left bundle-branch block, unspecified: Secondary | ICD-10-CM | POA: Insufficient documentation

## 2011-12-07 DIAGNOSIS — Z950 Presence of cardiac pacemaker: Secondary | ICD-10-CM | POA: Insufficient documentation

## 2011-12-07 DIAGNOSIS — Z5189 Encounter for other specified aftercare: Secondary | ICD-10-CM | POA: Insufficient documentation

## 2011-12-07 DIAGNOSIS — I059 Rheumatic mitral valve disease, unspecified: Secondary | ICD-10-CM | POA: Insufficient documentation

## 2011-12-07 DIAGNOSIS — Z9861 Coronary angioplasty status: Secondary | ICD-10-CM | POA: Insufficient documentation

## 2011-12-07 DIAGNOSIS — E785 Hyperlipidemia, unspecified: Secondary | ICD-10-CM | POA: Insufficient documentation

## 2011-12-07 DIAGNOSIS — I2789 Other specified pulmonary heart diseases: Secondary | ICD-10-CM | POA: Insufficient documentation

## 2011-12-07 DIAGNOSIS — Z9889 Other specified postprocedural states: Secondary | ICD-10-CM | POA: Insufficient documentation

## 2011-12-07 DIAGNOSIS — Z7901 Long term (current) use of anticoagulants: Secondary | ICD-10-CM | POA: Insufficient documentation

## 2011-12-07 DIAGNOSIS — F3289 Other specified depressive episodes: Secondary | ICD-10-CM | POA: Insufficient documentation

## 2011-12-07 DIAGNOSIS — I2510 Atherosclerotic heart disease of native coronary artery without angina pectoris: Secondary | ICD-10-CM | POA: Insufficient documentation

## 2011-12-07 DIAGNOSIS — D890 Polyclonal hypergammaglobulinemia: Secondary | ICD-10-CM | POA: Insufficient documentation

## 2011-12-07 DIAGNOSIS — R092 Respiratory arrest: Secondary | ICD-10-CM | POA: Insufficient documentation

## 2011-12-07 DIAGNOSIS — I4729 Other ventricular tachycardia: Secondary | ICD-10-CM | POA: Insufficient documentation

## 2011-12-10 ENCOUNTER — Encounter (HOSPITAL_COMMUNITY): Payer: Self-pay

## 2011-12-12 ENCOUNTER — Encounter (HOSPITAL_COMMUNITY): Payer: Self-pay

## 2011-12-13 ENCOUNTER — Encounter: Payer: Self-pay | Admitting: Internal Medicine

## 2011-12-13 ENCOUNTER — Ambulatory Visit (INDEPENDENT_AMBULATORY_CARE_PROVIDER_SITE_OTHER): Payer: Medicare Other | Admitting: Internal Medicine

## 2011-12-13 DIAGNOSIS — I498 Other specified cardiac arrhythmias: Secondary | ICD-10-CM

## 2011-12-13 DIAGNOSIS — I4891 Unspecified atrial fibrillation: Secondary | ICD-10-CM

## 2011-12-13 LAB — PACEMAKER DEVICE OBSERVATION
AL AMPLITUDE: 1 mv
AL IMPEDENCE PM: 384 Ohm
BATTERY VOLTAGE: 2.97 V
RV LEAD AMPLITUDE: 20.3 mv
RV LEAD IMPEDENCE PM: 536 Ohm
VENTRICULAR PACING PM: 0.11

## 2011-12-13 NOTE — Assessment & Plan Note (Signed)
Predominantly in sinus rhythm on tikosyn. EKG 1/13 reveals stable Qtc INRs frequently subtherapeutic Could consider pradaxa or xarelto, though she has a h/o mitral repair.  I will defer this decision to Dr Swaziland    Return to device clinic in 6 months I will see again in 1 year

## 2011-12-13 NOTE — Assessment & Plan Note (Signed)
Normal pacemaker function See Pace Art report No changes today  

## 2011-12-13 NOTE — Patient Instructions (Signed)
Your physician wants you to follow-up in: 6 months with device clinic and 12 months Dr Allred You will receive a reminder letter in the mail two months in advance. If you don't receive a letter, please call our office to schedule the follow-up appointment.  

## 2011-12-13 NOTE — Progress Notes (Signed)
PCP:  Maryelizabeth Rowan, MD, MD Primary Cardiologist:  Dr Swaziland  The patient presents today for routine electrophysiology followup.  She has a h/o atrial fibrillation and bradycardia s/p PPM implantation (MDT) implantation 05/19/2008 by Dr Reyes Ivan.  She remains very active . Her spouse has died since her last visit.   She denies symptoms of palpitations, chest pain, shortness of breath, orthopnea, PND, lower extremity edema, dizziness, presyncope, syncope, or neurologic sequela. The patient is tolerating medications without difficulties and is otherwise without complaint today.    Past Medical History  Diagnosis Date  . Coronary artery disease     Prior stent to RCA in 1996 and 2008; s/p CAB in 2009 with MV repair and Maze  . Atrial fibrillation     in sinus on Tikosyn  . CHF (congestive heart failure)     EF 35% per echo in 2009  . Hypertension   . Anxiety   . Depression   . Hyperlipidemia   . Mitral insufficiency     s/p MV repair 2009  . IDA (iron deficiency anemia)   . Subarachnoid hemorrhage   . LBBB (left bundle branch block)   . Chronic anticoagulation   . High risk medication use     Tikosyn  . Tachycardia-bradycardia     s/p PPM   Past Surgical History  Procedure Date  . Cardiac catheterization 03/19/2007    EF 45%. THERE IS 2+ MITRAL INSUFFICIENCY. THERE IS MODERATE INFERIOR WALL HYPOKINESIA WITH OVERALL MILD TO MODERATE LV DYSFUNCTION  . Coronary stent placement 1996, AND 03/2007    RIGHT CORONARY  . Removal of colon polyp     AND A LARGE CECAL POLYP  . Coronary artery bypass graft 04/2008    INCLUDING A MITRAL VALVE REPAIR AND A LEFT SIDED MAZE PROCEDURE  . Coronary artery bypass graft     X2 with LIMA to LAD and SVG to PD and WITH A LEFT SIDED MAZE PROCEDURE & MITRAL VALVE REPAIR. THIS INCLUDED AN LIMA GRAFT TO THE LAD, AND A SAPHENOUS VEIN GRAFT TO THE PDA.  Marland Kitchen Transthoracic echocardiogram 06/21/2008    EF 35%  . Cardiac pacemaker placement   . Mitral valve repair       Current Outpatient Prescriptions  Medication Sig Dispense Refill  . Cholecalciferol (VITAMIN D) 2000 UNITS tablet Take 2,000 Units by mouth daily.        . digoxin (LANOXIN) 0.125 MG tablet TAKE 1 TABLET (125 MCG TOTAL) BY MOUTH DAILY.  30 tablet  5  . dofetilide (TIKOSYN) 250 MCG capsule Take 1 capsule (250 mcg total) by mouth 2 (two) times daily.  60 capsule  1  . ezetimibe (ZETIA) 10 MG tablet Take 1 tablet (10 mg total) by mouth daily.  30 tablet  11  . LORazepam (ATIVAN) 0.5 MG tablet Take 1 tablet (0.5 mg total) by mouth every 8 (eight) hours as needed.  30 tablet  5  . metoprolol tartrate (LOPRESSOR) 25 MG tablet Take 0.5 tablets (12.5 mg total) by mouth 2 (two) times daily.  30 tablet  5  . pantoprazole (PROTONIX) 40 MG tablet Take 1 tablet (40 mg total) by mouth daily.  30 tablet  10  . warfarin (COUMADIN) 1 MG tablet Take as directed by anticoagulation clinic  90 tablet  3    Allergies  Allergen Reactions  . Amiodarone   . Benadryl Allergy     History   Social History  . Marital Status: Married    Spouse Name: N/A  Number of Children: N/A  . Years of Education: N/A   Occupational History  . Not on file.   Social History Main Topics  . Smoking status: Former Smoker    Quit date: 05/06/1996  . Smokeless tobacco: Not on file  . Alcohol Use: No  . Drug Use: No  . Sexually Active: No   Other Topics Concern  . Not on file   Social History Narrative  . No narrative on file    Family History  Problem Relation Age of Onset  . Heart attack Mother   . Cancer Father    Physical Exam: Filed Vitals:   12/13/11 1130  BP: 118/52  Pulse: 56  Height: 5\' 5"  (1.651 m)  Weight: 144 lb 12.8 oz (65.681 kg)    GEN- The patient is well appearing, alert and oriented x 3 today.   Head- normocephalic, atraumatic Eyes-  Sclera clear, conjunctiva pink Ears- hearing intact Oropharynx- clear Neck- supple Lungs- Clear to ausculation bilaterally, normal work of  breathing Chest- pacemaker pocket is well healed Heart- Regular rate and rhythm, no murmurs, rubs or gallops, PMI not laterally displaced GI- soft, NT, ND, + BS Extremities- no clubbing, cyanosis, or edema  Pacemaker interrogation- reviewed in detail today,  See PACEART report  Assessment and Plan:

## 2011-12-14 ENCOUNTER — Encounter (HOSPITAL_COMMUNITY): Payer: Self-pay

## 2011-12-17 ENCOUNTER — Encounter (HOSPITAL_COMMUNITY)
Admission: RE | Admit: 2011-12-17 | Discharge: 2011-12-17 | Disposition: A | Discharge status: Home / Self Care | Payer: Self-pay | Source: Ambulatory Visit | Attending: Cardiology | Admitting: Cardiology

## 2011-12-19 ENCOUNTER — Encounter (HOSPITAL_COMMUNITY)
Admission: RE | Admit: 2011-12-19 | Discharge: 2011-12-19 | Disposition: A | Discharge status: Home / Self Care | Payer: Self-pay | Source: Ambulatory Visit | Attending: Cardiology | Admitting: Cardiology

## 2011-12-21 ENCOUNTER — Encounter (HOSPITAL_COMMUNITY)
Admission: RE | Admit: 2011-12-21 | Discharge: 2011-12-21 | Disposition: A | Discharge status: Home / Self Care | Payer: Self-pay | Source: Ambulatory Visit | Attending: Cardiology | Admitting: Cardiology

## 2011-12-24 ENCOUNTER — Encounter (HOSPITAL_COMMUNITY)
Admission: RE | Admit: 2011-12-24 | Discharge: 2011-12-24 | Disposition: A | Discharge status: Home / Self Care | Payer: Self-pay | Source: Ambulatory Visit | Attending: Cardiology | Admitting: Cardiology

## 2011-12-26 ENCOUNTER — Encounter (HOSPITAL_COMMUNITY)
Admission: RE | Admit: 2011-12-26 | Discharge: 2011-12-26 | Disposition: A | Discharge status: Home / Self Care | Payer: Self-pay | Source: Ambulatory Visit | Attending: Cardiology | Admitting: Cardiology

## 2011-12-28 ENCOUNTER — Encounter (HOSPITAL_COMMUNITY)
Admission: RE | Admit: 2011-12-28 | Discharge: 2011-12-28 | Disposition: A | Discharge status: Home / Self Care | Payer: Self-pay | Source: Ambulatory Visit | Attending: Cardiology | Admitting: Cardiology

## 2011-12-28 ENCOUNTER — Ambulatory Visit (INDEPENDENT_AMBULATORY_CARE_PROVIDER_SITE_OTHER): Payer: Medicare Other

## 2011-12-28 DIAGNOSIS — I4891 Unspecified atrial fibrillation: Secondary | ICD-10-CM

## 2011-12-28 DIAGNOSIS — Z7901 Long term (current) use of anticoagulants: Secondary | ICD-10-CM

## 2011-12-31 ENCOUNTER — Encounter (HOSPITAL_COMMUNITY): Payer: Self-pay

## 2012-01-02 ENCOUNTER — Encounter (HOSPITAL_COMMUNITY): Payer: Self-pay

## 2012-01-04 ENCOUNTER — Encounter (HOSPITAL_COMMUNITY): Payer: Self-pay

## 2012-01-07 ENCOUNTER — Encounter (HOSPITAL_COMMUNITY)
Admission: RE | Admit: 2012-01-07 | Discharge: 2012-01-07 | Disposition: A | Discharge status: Home / Self Care | Payer: Self-pay | Source: Ambulatory Visit | Attending: Cardiology | Admitting: Cardiology

## 2012-01-07 DIAGNOSIS — D890 Polyclonal hypergammaglobulinemia: Secondary | ICD-10-CM | POA: Insufficient documentation

## 2012-01-07 DIAGNOSIS — I2789 Other specified pulmonary heart diseases: Secondary | ICD-10-CM | POA: Insufficient documentation

## 2012-01-07 DIAGNOSIS — F3289 Other specified depressive episodes: Secondary | ICD-10-CM | POA: Insufficient documentation

## 2012-01-07 DIAGNOSIS — Z5189 Encounter for other specified aftercare: Secondary | ICD-10-CM | POA: Insufficient documentation

## 2012-01-07 DIAGNOSIS — Z9861 Coronary angioplasty status: Secondary | ICD-10-CM | POA: Insufficient documentation

## 2012-01-07 DIAGNOSIS — Z951 Presence of aortocoronary bypass graft: Secondary | ICD-10-CM | POA: Insufficient documentation

## 2012-01-07 DIAGNOSIS — Z7901 Long term (current) use of anticoagulants: Secondary | ICD-10-CM | POA: Insufficient documentation

## 2012-01-07 DIAGNOSIS — I447 Left bundle-branch block, unspecified: Secondary | ICD-10-CM | POA: Insufficient documentation

## 2012-01-07 DIAGNOSIS — I472 Ventricular tachycardia, unspecified: Secondary | ICD-10-CM | POA: Insufficient documentation

## 2012-01-07 DIAGNOSIS — I4729 Other ventricular tachycardia: Secondary | ICD-10-CM | POA: Insufficient documentation

## 2012-01-07 DIAGNOSIS — I501 Left ventricular failure: Secondary | ICD-10-CM | POA: Insufficient documentation

## 2012-01-07 DIAGNOSIS — Z8674 Personal history of sudden cardiac arrest: Secondary | ICD-10-CM | POA: Insufficient documentation

## 2012-01-07 DIAGNOSIS — I059 Rheumatic mitral valve disease, unspecified: Secondary | ICD-10-CM | POA: Insufficient documentation

## 2012-01-07 DIAGNOSIS — E785 Hyperlipidemia, unspecified: Secondary | ICD-10-CM | POA: Insufficient documentation

## 2012-01-07 DIAGNOSIS — Z7982 Long term (current) use of aspirin: Secondary | ICD-10-CM | POA: Insufficient documentation

## 2012-01-07 DIAGNOSIS — I4891 Unspecified atrial fibrillation: Secondary | ICD-10-CM | POA: Insufficient documentation

## 2012-01-07 DIAGNOSIS — I2589 Other forms of chronic ischemic heart disease: Secondary | ICD-10-CM | POA: Insufficient documentation

## 2012-01-07 DIAGNOSIS — Z950 Presence of cardiac pacemaker: Secondary | ICD-10-CM | POA: Insufficient documentation

## 2012-01-07 DIAGNOSIS — R092 Respiratory arrest: Secondary | ICD-10-CM | POA: Insufficient documentation

## 2012-01-07 DIAGNOSIS — I509 Heart failure, unspecified: Secondary | ICD-10-CM | POA: Insufficient documentation

## 2012-01-07 DIAGNOSIS — I252 Old myocardial infarction: Secondary | ICD-10-CM | POA: Insufficient documentation

## 2012-01-07 DIAGNOSIS — Z9889 Other specified postprocedural states: Secondary | ICD-10-CM | POA: Insufficient documentation

## 2012-01-07 DIAGNOSIS — D509 Iron deficiency anemia, unspecified: Secondary | ICD-10-CM | POA: Insufficient documentation

## 2012-01-07 DIAGNOSIS — I959 Hypotension, unspecified: Secondary | ICD-10-CM | POA: Insufficient documentation

## 2012-01-07 DIAGNOSIS — F329 Major depressive disorder, single episode, unspecified: Secondary | ICD-10-CM | POA: Insufficient documentation

## 2012-01-07 DIAGNOSIS — I2510 Atherosclerotic heart disease of native coronary artery without angina pectoris: Secondary | ICD-10-CM | POA: Insufficient documentation

## 2012-01-09 ENCOUNTER — Encounter (HOSPITAL_COMMUNITY): Payer: Self-pay

## 2012-01-11 ENCOUNTER — Encounter (HOSPITAL_COMMUNITY): Payer: Self-pay

## 2012-01-14 ENCOUNTER — Encounter (HOSPITAL_COMMUNITY)
Admission: RE | Admit: 2012-01-14 | Discharge: 2012-01-14 | Disposition: A | Discharge status: Home / Self Care | Payer: Self-pay | Source: Ambulatory Visit | Attending: Cardiology | Admitting: Cardiology

## 2012-01-16 ENCOUNTER — Encounter (HOSPITAL_COMMUNITY): Payer: Self-pay

## 2012-01-18 ENCOUNTER — Encounter (HOSPITAL_COMMUNITY)
Admission: RE | Admit: 2012-01-18 | Discharge: 2012-01-18 | Disposition: A | Discharge status: Home / Self Care | Payer: Self-pay | Source: Ambulatory Visit | Attending: Cardiology | Admitting: Cardiology

## 2012-01-21 ENCOUNTER — Encounter (HOSPITAL_COMMUNITY)
Admission: RE | Admit: 2012-01-21 | Discharge: 2012-01-21 | Disposition: A | Discharge status: Home / Self Care | Payer: Self-pay | Source: Ambulatory Visit | Attending: Cardiology | Admitting: Cardiology

## 2012-01-23 ENCOUNTER — Encounter (HOSPITAL_COMMUNITY)
Admission: RE | Admit: 2012-01-23 | Discharge: 2012-01-23 | Disposition: A | Discharge status: Home / Self Care | Payer: Self-pay | Source: Ambulatory Visit | Attending: Cardiology | Admitting: Cardiology

## 2012-01-24 ENCOUNTER — Other Ambulatory Visit: Payer: Self-pay | Admitting: *Deleted

## 2012-01-24 MED ORDER — PANTOPRAZOLE SODIUM 40 MG PO TBEC: 40.0000 mg | DELAYED_RELEASE_TABLET | Freq: Every day | ORAL | Status: DC

## 2012-01-24 MED ORDER — EZETIMIBE 10 MG PO TABS: 10.0000 mg | ORAL_TABLET | Freq: Every day | ORAL | Status: DC

## 2012-01-24 MED ORDER — DIGOXIN 125 MCG PO TABS: 125.0000 ug | ORAL_TABLET | Freq: Every day | ORAL | Status: DC

## 2012-01-24 MED ORDER — METOPROLOL TARTRATE 25 MG PO TABS: 12.5000 mg | ORAL_TABLET | Freq: Two times a day (BID) | ORAL | Status: DC

## 2012-01-25 ENCOUNTER — Ambulatory Visit (INDEPENDENT_AMBULATORY_CARE_PROVIDER_SITE_OTHER): Payer: Medicare Other

## 2012-01-25 ENCOUNTER — Encounter (HOSPITAL_COMMUNITY)
Admission: RE | Admit: 2012-01-25 | Discharge: 2012-01-25 | Disposition: A | Discharge status: Home / Self Care | Payer: Medicare Other | Source: Ambulatory Visit | Attending: Cardiology | Admitting: Cardiology

## 2012-01-25 DIAGNOSIS — Z7901 Long term (current) use of anticoagulants: Secondary | ICD-10-CM

## 2012-01-25 DIAGNOSIS — I4891 Unspecified atrial fibrillation: Secondary | ICD-10-CM

## 2012-01-28 ENCOUNTER — Encounter (HOSPITAL_COMMUNITY): Payer: Medicare Other

## 2012-01-30 ENCOUNTER — Encounter (HOSPITAL_COMMUNITY)
Admission: RE | Admit: 2012-01-30 | Discharge: 2012-01-30 | Disposition: A | Discharge status: Home / Self Care | Payer: Medicare Other | Source: Ambulatory Visit | Attending: Cardiology | Admitting: Cardiology

## 2012-01-30 MED ORDER — MIDAZOLAM HCL 10 MG/2ML IJ SOLN
INTRAMUSCULAR | Status: AC
  Filled 2012-01-30: qty 2

## 2012-01-30 MED ORDER — FENTANYL CITRATE 0.05 MG/ML IJ SOLN
INTRAMUSCULAR | Status: AC
  Filled 2012-01-30: qty 2

## 2012-02-01 ENCOUNTER — Encounter (HOSPITAL_COMMUNITY): Payer: Medicare Other

## 2012-02-04 ENCOUNTER — Encounter (HOSPITAL_COMMUNITY)
Admission: RE | Admit: 2012-02-04 | Discharge: 2012-02-04 | Disposition: A | Discharge status: Home / Self Care | Payer: Self-pay | Source: Ambulatory Visit | Attending: Cardiology | Admitting: Cardiology

## 2012-02-06 ENCOUNTER — Encounter (HOSPITAL_COMMUNITY)
Admission: RE | Admit: 2012-02-06 | Discharge: 2012-02-06 | Disposition: A | Discharge status: Home / Self Care | Payer: Medicare Other | Source: Ambulatory Visit | Attending: Cardiology | Admitting: Cardiology

## 2012-02-06 DIAGNOSIS — F3289 Other specified depressive episodes: Secondary | ICD-10-CM | POA: Insufficient documentation

## 2012-02-06 DIAGNOSIS — Z9861 Coronary angioplasty status: Secondary | ICD-10-CM | POA: Insufficient documentation

## 2012-02-06 DIAGNOSIS — I2789 Other specified pulmonary heart diseases: Secondary | ICD-10-CM | POA: Insufficient documentation

## 2012-02-06 DIAGNOSIS — E785 Hyperlipidemia, unspecified: Secondary | ICD-10-CM | POA: Insufficient documentation

## 2012-02-06 DIAGNOSIS — I472 Ventricular tachycardia, unspecified: Secondary | ICD-10-CM | POA: Insufficient documentation

## 2012-02-06 DIAGNOSIS — D509 Iron deficiency anemia, unspecified: Secondary | ICD-10-CM | POA: Insufficient documentation

## 2012-02-06 DIAGNOSIS — I2510 Atherosclerotic heart disease of native coronary artery without angina pectoris: Secondary | ICD-10-CM | POA: Insufficient documentation

## 2012-02-06 DIAGNOSIS — I252 Old myocardial infarction: Secondary | ICD-10-CM | POA: Insufficient documentation

## 2012-02-06 DIAGNOSIS — R092 Respiratory arrest: Secondary | ICD-10-CM | POA: Insufficient documentation

## 2012-02-06 DIAGNOSIS — I959 Hypotension, unspecified: Secondary | ICD-10-CM | POA: Insufficient documentation

## 2012-02-06 DIAGNOSIS — I2589 Other forms of chronic ischemic heart disease: Secondary | ICD-10-CM | POA: Insufficient documentation

## 2012-02-06 DIAGNOSIS — I059 Rheumatic mitral valve disease, unspecified: Secondary | ICD-10-CM | POA: Insufficient documentation

## 2012-02-06 DIAGNOSIS — Z8674 Personal history of sudden cardiac arrest: Secondary | ICD-10-CM | POA: Insufficient documentation

## 2012-02-06 DIAGNOSIS — D890 Polyclonal hypergammaglobulinemia: Secondary | ICD-10-CM | POA: Insufficient documentation

## 2012-02-06 DIAGNOSIS — I501 Left ventricular failure: Secondary | ICD-10-CM | POA: Insufficient documentation

## 2012-02-06 DIAGNOSIS — Z7901 Long term (current) use of anticoagulants: Secondary | ICD-10-CM | POA: Insufficient documentation

## 2012-02-06 DIAGNOSIS — I509 Heart failure, unspecified: Secondary | ICD-10-CM | POA: Insufficient documentation

## 2012-02-06 DIAGNOSIS — Z951 Presence of aortocoronary bypass graft: Secondary | ICD-10-CM | POA: Insufficient documentation

## 2012-02-06 DIAGNOSIS — Z5189 Encounter for other specified aftercare: Secondary | ICD-10-CM | POA: Insufficient documentation

## 2012-02-06 DIAGNOSIS — F329 Major depressive disorder, single episode, unspecified: Secondary | ICD-10-CM | POA: Insufficient documentation

## 2012-02-06 DIAGNOSIS — I4891 Unspecified atrial fibrillation: Secondary | ICD-10-CM | POA: Insufficient documentation

## 2012-02-06 DIAGNOSIS — Z9889 Other specified postprocedural states: Secondary | ICD-10-CM | POA: Insufficient documentation

## 2012-02-06 DIAGNOSIS — Z950 Presence of cardiac pacemaker: Secondary | ICD-10-CM | POA: Insufficient documentation

## 2012-02-06 DIAGNOSIS — I447 Left bundle-branch block, unspecified: Secondary | ICD-10-CM | POA: Insufficient documentation

## 2012-02-06 DIAGNOSIS — I4729 Other ventricular tachycardia: Secondary | ICD-10-CM | POA: Insufficient documentation

## 2012-02-06 DIAGNOSIS — Z7982 Long term (current) use of aspirin: Secondary | ICD-10-CM | POA: Insufficient documentation

## 2012-02-08 ENCOUNTER — Encounter (HOSPITAL_COMMUNITY): Payer: Medicare Other

## 2012-02-11 ENCOUNTER — Encounter (HOSPITAL_COMMUNITY): Payer: Medicare Other

## 2012-02-13 ENCOUNTER — Encounter (HOSPITAL_COMMUNITY)
Admission: RE | Admit: 2012-02-13 | Discharge: 2012-02-13 | Disposition: A | Discharge status: Home / Self Care | Payer: Medicare Other | Source: Ambulatory Visit | Attending: Cardiology | Admitting: Cardiology

## 2012-02-15 ENCOUNTER — Encounter (HOSPITAL_COMMUNITY): Payer: Medicare Other

## 2012-02-18 ENCOUNTER — Encounter (HOSPITAL_COMMUNITY): Payer: Medicare Other

## 2012-02-20 ENCOUNTER — Encounter (HOSPITAL_COMMUNITY): Payer: Medicare Other

## 2012-02-22 ENCOUNTER — Encounter (HOSPITAL_COMMUNITY): Payer: Medicare Other

## 2012-02-25 ENCOUNTER — Encounter (HOSPITAL_COMMUNITY)
Admission: RE | Admit: 2012-02-25 | Discharge: 2012-02-25 | Disposition: A | Discharge status: Home / Self Care | Payer: Medicare Other | Source: Ambulatory Visit | Attending: Cardiology | Admitting: Cardiology

## 2012-02-27 ENCOUNTER — Encounter (HOSPITAL_COMMUNITY)
Admission: RE | Admit: 2012-02-27 | Discharge: 2012-02-27 | Disposition: A | Discharge status: Home / Self Care | Payer: Medicare Other | Source: Ambulatory Visit | Attending: Cardiology | Admitting: Cardiology

## 2012-02-29 ENCOUNTER — Encounter (HOSPITAL_COMMUNITY): Payer: Medicare Other

## 2012-03-03 ENCOUNTER — Encounter (HOSPITAL_COMMUNITY): Payer: Medicare Other

## 2012-03-05 ENCOUNTER — Encounter (HOSPITAL_COMMUNITY): Payer: Medicare Other

## 2012-03-07 ENCOUNTER — Encounter (HOSPITAL_COMMUNITY): Payer: Medicare Other

## 2012-03-07 ENCOUNTER — Ambulatory Visit (INDEPENDENT_AMBULATORY_CARE_PROVIDER_SITE_OTHER): Payer: Medicare Other | Admitting: Pharmacist

## 2012-03-07 DIAGNOSIS — Z7901 Long term (current) use of anticoagulants: Secondary | ICD-10-CM

## 2012-03-07 DIAGNOSIS — I4891 Unspecified atrial fibrillation: Secondary | ICD-10-CM

## 2012-03-07 LAB — POCT INR: INR: 1.9

## 2012-03-10 ENCOUNTER — Encounter (HOSPITAL_COMMUNITY): Payer: Medicare Other

## 2012-03-10 DIAGNOSIS — I059 Rheumatic mitral valve disease, unspecified: Secondary | ICD-10-CM | POA: Insufficient documentation

## 2012-03-10 DIAGNOSIS — I509 Heart failure, unspecified: Secondary | ICD-10-CM | POA: Insufficient documentation

## 2012-03-10 DIAGNOSIS — Z9861 Coronary angioplasty status: Secondary | ICD-10-CM | POA: Insufficient documentation

## 2012-03-10 DIAGNOSIS — Z8674 Personal history of sudden cardiac arrest: Secondary | ICD-10-CM | POA: Insufficient documentation

## 2012-03-10 DIAGNOSIS — I4891 Unspecified atrial fibrillation: Secondary | ICD-10-CM | POA: Insufficient documentation

## 2012-03-10 DIAGNOSIS — I4729 Other ventricular tachycardia: Secondary | ICD-10-CM | POA: Insufficient documentation

## 2012-03-10 DIAGNOSIS — Z5189 Encounter for other specified aftercare: Secondary | ICD-10-CM | POA: Insufficient documentation

## 2012-03-10 DIAGNOSIS — I2589 Other forms of chronic ischemic heart disease: Secondary | ICD-10-CM | POA: Insufficient documentation

## 2012-03-10 DIAGNOSIS — I2510 Atherosclerotic heart disease of native coronary artery without angina pectoris: Secondary | ICD-10-CM | POA: Insufficient documentation

## 2012-03-10 DIAGNOSIS — D509 Iron deficiency anemia, unspecified: Secondary | ICD-10-CM | POA: Insufficient documentation

## 2012-03-10 DIAGNOSIS — F3289 Other specified depressive episodes: Secondary | ICD-10-CM | POA: Insufficient documentation

## 2012-03-10 DIAGNOSIS — I959 Hypotension, unspecified: Secondary | ICD-10-CM | POA: Insufficient documentation

## 2012-03-10 DIAGNOSIS — D890 Polyclonal hypergammaglobulinemia: Secondary | ICD-10-CM | POA: Insufficient documentation

## 2012-03-10 DIAGNOSIS — I472 Ventricular tachycardia, unspecified: Secondary | ICD-10-CM | POA: Insufficient documentation

## 2012-03-10 DIAGNOSIS — F329 Major depressive disorder, single episode, unspecified: Secondary | ICD-10-CM | POA: Insufficient documentation

## 2012-03-10 DIAGNOSIS — I252 Old myocardial infarction: Secondary | ICD-10-CM | POA: Insufficient documentation

## 2012-03-10 DIAGNOSIS — I2789 Other specified pulmonary heart diseases: Secondary | ICD-10-CM | POA: Insufficient documentation

## 2012-03-10 DIAGNOSIS — Z7901 Long term (current) use of anticoagulants: Secondary | ICD-10-CM | POA: Insufficient documentation

## 2012-03-10 DIAGNOSIS — R092 Respiratory arrest: Secondary | ICD-10-CM | POA: Insufficient documentation

## 2012-03-10 DIAGNOSIS — I447 Left bundle-branch block, unspecified: Secondary | ICD-10-CM | POA: Insufficient documentation

## 2012-03-10 DIAGNOSIS — I501 Left ventricular failure: Secondary | ICD-10-CM | POA: Insufficient documentation

## 2012-03-10 DIAGNOSIS — E785 Hyperlipidemia, unspecified: Secondary | ICD-10-CM | POA: Insufficient documentation

## 2012-03-10 DIAGNOSIS — Z951 Presence of aortocoronary bypass graft: Secondary | ICD-10-CM | POA: Insufficient documentation

## 2012-03-10 DIAGNOSIS — Z7982 Long term (current) use of aspirin: Secondary | ICD-10-CM | POA: Insufficient documentation

## 2012-03-10 DIAGNOSIS — Z9889 Other specified postprocedural states: Secondary | ICD-10-CM | POA: Insufficient documentation

## 2012-03-10 DIAGNOSIS — Z950 Presence of cardiac pacemaker: Secondary | ICD-10-CM | POA: Insufficient documentation

## 2012-03-12 ENCOUNTER — Encounter (HOSPITAL_COMMUNITY): Payer: Medicare Other

## 2012-03-14 ENCOUNTER — Encounter (HOSPITAL_COMMUNITY): Payer: Medicare Other

## 2012-03-17 ENCOUNTER — Encounter (HOSPITAL_COMMUNITY): Payer: Medicare Other

## 2012-03-19 ENCOUNTER — Encounter (HOSPITAL_COMMUNITY)
Admission: RE | Admit: 2012-03-19 | Discharge: 2012-03-19 | Disposition: A | Discharge status: Home / Self Care | Payer: Medicare Other | Source: Ambulatory Visit | Attending: Cardiology | Admitting: Cardiology

## 2012-03-21 ENCOUNTER — Encounter (HOSPITAL_COMMUNITY)
Admission: RE | Admit: 2012-03-21 | Discharge: 2012-03-21 | Disposition: A | Discharge status: Home / Self Care | Payer: Medicare Other | Source: Ambulatory Visit | Attending: Cardiology | Admitting: Cardiology

## 2012-03-24 ENCOUNTER — Encounter (HOSPITAL_COMMUNITY): Payer: Medicare Other

## 2012-03-26 ENCOUNTER — Encounter (HOSPITAL_COMMUNITY): Payer: Medicare Other

## 2012-03-28 ENCOUNTER — Encounter (HOSPITAL_COMMUNITY): Payer: Medicare Other

## 2012-03-31 ENCOUNTER — Encounter (INDEPENDENT_AMBULATORY_CARE_PROVIDER_SITE_OTHER): Payer: Medicare Other | Admitting: *Deleted

## 2012-03-31 ENCOUNTER — Encounter (HOSPITAL_COMMUNITY)
Admission: RE | Admit: 2012-03-31 | Discharge: 2012-03-31 | Disposition: A | Discharge status: Home / Self Care | Payer: Medicare Other | Source: Ambulatory Visit | Attending: Cardiology | Admitting: Cardiology

## 2012-03-31 DIAGNOSIS — Z7901 Long term (current) use of anticoagulants: Secondary | ICD-10-CM

## 2012-03-31 DIAGNOSIS — I4891 Unspecified atrial fibrillation: Secondary | ICD-10-CM

## 2012-04-01 ENCOUNTER — Other Ambulatory Visit: Payer: Self-pay | Admitting: Cardiology

## 2012-04-02 ENCOUNTER — Encounter (HOSPITAL_COMMUNITY): Payer: Medicare Other

## 2012-04-04 ENCOUNTER — Encounter (HOSPITAL_COMMUNITY): Payer: Medicare Other

## 2012-04-07 ENCOUNTER — Encounter (HOSPITAL_COMMUNITY): Payer: Medicare Other | Attending: Cardiology

## 2012-04-07 DIAGNOSIS — E785 Hyperlipidemia, unspecified: Secondary | ICD-10-CM | POA: Insufficient documentation

## 2012-04-07 DIAGNOSIS — Z7901 Long term (current) use of anticoagulants: Secondary | ICD-10-CM | POA: Insufficient documentation

## 2012-04-07 DIAGNOSIS — I509 Heart failure, unspecified: Secondary | ICD-10-CM | POA: Insufficient documentation

## 2012-04-07 DIAGNOSIS — I4891 Unspecified atrial fibrillation: Secondary | ICD-10-CM | POA: Insufficient documentation

## 2012-04-07 DIAGNOSIS — Z9889 Other specified postprocedural states: Secondary | ICD-10-CM | POA: Insufficient documentation

## 2012-04-07 DIAGNOSIS — Z5189 Encounter for other specified aftercare: Secondary | ICD-10-CM | POA: Insufficient documentation

## 2012-04-07 DIAGNOSIS — D890 Polyclonal hypergammaglobulinemia: Secondary | ICD-10-CM | POA: Insufficient documentation

## 2012-04-07 DIAGNOSIS — R092 Respiratory arrest: Secondary | ICD-10-CM | POA: Insufficient documentation

## 2012-04-07 DIAGNOSIS — F3289 Other specified depressive episodes: Secondary | ICD-10-CM | POA: Insufficient documentation

## 2012-04-07 DIAGNOSIS — I4729 Other ventricular tachycardia: Secondary | ICD-10-CM | POA: Insufficient documentation

## 2012-04-07 DIAGNOSIS — I472 Ventricular tachycardia, unspecified: Secondary | ICD-10-CM | POA: Insufficient documentation

## 2012-04-07 DIAGNOSIS — I959 Hypotension, unspecified: Secondary | ICD-10-CM | POA: Insufficient documentation

## 2012-04-07 DIAGNOSIS — Z950 Presence of cardiac pacemaker: Secondary | ICD-10-CM | POA: Insufficient documentation

## 2012-04-07 DIAGNOSIS — I2589 Other forms of chronic ischemic heart disease: Secondary | ICD-10-CM | POA: Insufficient documentation

## 2012-04-07 DIAGNOSIS — Z951 Presence of aortocoronary bypass graft: Secondary | ICD-10-CM | POA: Insufficient documentation

## 2012-04-07 DIAGNOSIS — D509 Iron deficiency anemia, unspecified: Secondary | ICD-10-CM | POA: Insufficient documentation

## 2012-04-07 DIAGNOSIS — I059 Rheumatic mitral valve disease, unspecified: Secondary | ICD-10-CM | POA: Insufficient documentation

## 2012-04-07 DIAGNOSIS — Z8674 Personal history of sudden cardiac arrest: Secondary | ICD-10-CM | POA: Insufficient documentation

## 2012-04-07 DIAGNOSIS — I447 Left bundle-branch block, unspecified: Secondary | ICD-10-CM | POA: Insufficient documentation

## 2012-04-07 DIAGNOSIS — F329 Major depressive disorder, single episode, unspecified: Secondary | ICD-10-CM | POA: Insufficient documentation

## 2012-04-07 DIAGNOSIS — Z7982 Long term (current) use of aspirin: Secondary | ICD-10-CM | POA: Insufficient documentation

## 2012-04-07 DIAGNOSIS — I2510 Atherosclerotic heart disease of native coronary artery without angina pectoris: Secondary | ICD-10-CM | POA: Insufficient documentation

## 2012-04-07 DIAGNOSIS — I252 Old myocardial infarction: Secondary | ICD-10-CM | POA: Insufficient documentation

## 2012-04-07 DIAGNOSIS — Z9861 Coronary angioplasty status: Secondary | ICD-10-CM | POA: Insufficient documentation

## 2012-04-07 DIAGNOSIS — I501 Left ventricular failure: Secondary | ICD-10-CM | POA: Insufficient documentation

## 2012-04-07 DIAGNOSIS — I2789 Other specified pulmonary heart diseases: Secondary | ICD-10-CM | POA: Insufficient documentation

## 2012-04-09 ENCOUNTER — Inpatient Hospital Stay (HOSPITAL_COMMUNITY)
Admission: EM | Admit: 2012-04-09 | Discharge: 2012-04-11 | DRG: 194 | Disposition: A | Discharge status: Home / Self Care | Payer: Medicare Other | Attending: Internal Medicine | Admitting: Internal Medicine

## 2012-04-09 ENCOUNTER — Emergency Department (HOSPITAL_COMMUNITY): Payer: Medicare Other

## 2012-04-09 ENCOUNTER — Encounter (HOSPITAL_COMMUNITY): Payer: Self-pay | Admitting: Emergency Medicine

## 2012-04-09 ENCOUNTER — Encounter (HOSPITAL_COMMUNITY): Payer: Medicare Other

## 2012-04-09 DIAGNOSIS — I2510 Atherosclerotic heart disease of native coronary artery without angina pectoris: Secondary | ICD-10-CM | POA: Diagnosis present

## 2012-04-09 DIAGNOSIS — I498 Other specified cardiac arrhythmias: Secondary | ICD-10-CM

## 2012-04-09 DIAGNOSIS — I10 Essential (primary) hypertension: Secondary | ICD-10-CM | POA: Diagnosis present

## 2012-04-09 DIAGNOSIS — I509 Heart failure, unspecified: Secondary | ICD-10-CM | POA: Diagnosis present

## 2012-04-09 DIAGNOSIS — Z7901 Long term (current) use of anticoagulants: Secondary | ICD-10-CM

## 2012-04-09 DIAGNOSIS — B370 Candidal stomatitis: Secondary | ICD-10-CM | POA: Diagnosis present

## 2012-04-09 DIAGNOSIS — Z951 Presence of aortocoronary bypass graft: Secondary | ICD-10-CM

## 2012-04-09 DIAGNOSIS — Z950 Presence of cardiac pacemaker: Secondary | ICD-10-CM

## 2012-04-09 DIAGNOSIS — J189 Pneumonia, unspecified organism: Secondary | ICD-10-CM | POA: Diagnosis present

## 2012-04-09 DIAGNOSIS — R0989 Other specified symptoms and signs involving the circulatory and respiratory systems: Secondary | ICD-10-CM

## 2012-04-09 DIAGNOSIS — I5022 Chronic systolic (congestive) heart failure: Secondary | ICD-10-CM | POA: Diagnosis present

## 2012-04-09 DIAGNOSIS — I4891 Unspecified atrial fibrillation: Secondary | ICD-10-CM | POA: Diagnosis present

## 2012-04-09 DIAGNOSIS — E7800 Pure hypercholesterolemia, unspecified: Secondary | ICD-10-CM

## 2012-04-09 DIAGNOSIS — R6889 Other general symptoms and signs: Secondary | ICD-10-CM

## 2012-04-09 DIAGNOSIS — R0609 Other forms of dyspnea: Secondary | ICD-10-CM

## 2012-04-09 DIAGNOSIS — R0600 Dyspnea, unspecified: Secondary | ICD-10-CM

## 2012-04-09 DIAGNOSIS — Z87891 Personal history of nicotine dependence: Secondary | ICD-10-CM

## 2012-04-09 LAB — COMPREHENSIVE METABOLIC PANEL
Albumin: 3.5 g/dL (ref 3.5–5.2)
BUN: 15 mg/dL (ref 6–23)
Chloride: 100 mEq/L (ref 96–112)
Creatinine, Ser: 0.76 mg/dL (ref 0.50–1.10)
GFR calc Af Amer: >90 mL/min (ref >90)
Glucose, Bld: 146 mg/dL — ABNORMAL HIGH (ref 70–99)
Total Bilirubin: 0.3 mg/dL (ref 0.3–1.2)
Total Protein: 7.5 g/dL (ref 6.0–8.3)

## 2012-04-09 LAB — CARDIAC PANEL(CRET KIN+CKTOT+MB+TROPI)
CK, MB: 2.9 ng/mL (ref 0.3–4.0)
Troponin I: <0.3 ng/mL (ref <0.30)

## 2012-04-09 LAB — CBC WITH DIFFERENTIAL/PLATELET
Basophils Absolute: 0 10*3/uL (ref 0.0–0.1)
Eosinophils Absolute: 0 10*3/uL (ref 0.0–0.7)
Eosinophils Relative: 0 % (ref 0–5)
Hemoglobin: 13.9 g/dL (ref 12.0–15.0)
Lymphocytes Relative: 5 % — ABNORMAL LOW (ref 12–46)
Lymphocytes Relative: 8 % — ABNORMAL LOW (ref 12–46)
Lymphs Abs: 0.9 10*3/uL (ref 0.7–4.0)
Lymphs Abs: 1.3 10*3/uL (ref 0.7–4.0)
MCH: 28.4 pg (ref 26.0–34.0)
MCV: 86.1 fL (ref 78.0–100.0)
Monocytes Relative: 4 % (ref 3–12)
Monocytes Relative: 7 % (ref 3–12)
Neutro Abs: 17 10*3/uL — ABNORMAL HIGH (ref 1.7–7.7)
Neutrophils Relative %: 91 % — ABNORMAL HIGH (ref 43–77)
Platelets: 211 10*3/uL (ref 150–400)
Platelets: 202 10*3/uL (ref 150–400)
RBC: 4.9 MIL/uL (ref 3.87–5.11)
RBC: 4.9 MIL/uL (ref 3.87–5.11)
RDW: 15.8 % — ABNORMAL HIGH (ref 11.5–15.5)
Smear Review: ADEQUATE
WBC: 18.8 10*3/uL — ABNORMAL HIGH (ref 4.0–10.5)
WBC: 15.7 10*3/uL — ABNORMAL HIGH (ref 4.0–10.5)

## 2012-04-09 LAB — POCT I-STAT TROPONIN I

## 2012-04-09 LAB — POCT I-STAT, CHEM 8
Chloride: 108 mEq/L (ref 96–112)
Creatinine, Ser: 0.7 mg/dL (ref 0.50–1.10)
Glucose, Bld: 199 mg/dL — ABNORMAL HIGH (ref 70–99)
Hemoglobin: 15 g/dL (ref 12.0–15.0)
Potassium: 4.1 mEq/L (ref 3.5–5.1)
Sodium: 141 mEq/L (ref 135–145)

## 2012-04-09 LAB — URINALYSIS, ROUTINE W REFLEX MICROSCOPIC
Bilirubin Urine: NEGATIVE
Ketones, ur: NEGATIVE mg/dL
Nitrite: NEGATIVE
Urobilinogen, UA: 0.2 mg/dL (ref 0.0–1.0)

## 2012-04-09 LAB — PROTIME-INR: Prothrombin Time: 24 seconds — ABNORMAL HIGH (ref 11.6–15.2)

## 2012-04-09 LAB — PRO B NATRIURETIC PEPTIDE
Pro B Natriuretic peptide (BNP): 2530 pg/mL — ABNORMAL HIGH (ref 0–450)
Pro B Natriuretic peptide (BNP): 5069 pg/mL — ABNORMAL HIGH (ref 0–450)

## 2012-04-09 LAB — DIGOXIN LEVEL
Digoxin Level: 0.5 ng/mL — ABNORMAL LOW (ref 0.8–2.0)
Digoxin Level: 0.6 ng/mL — ABNORMAL LOW (ref 0.8–2.0)

## 2012-04-09 LAB — URINE MICROSCOPIC-ADD ON

## 2012-04-09 MED ORDER — LORAZEPAM 0.5 MG PO TABS
0.5000 mg | ORAL_TABLET | Freq: Three times a day (TID) | ORAL | Status: DC
  Filled 2012-04-09: qty 1

## 2012-04-09 MED ORDER — DEXTROSE 5 % IV SOLN: 1.0000 g | INTRAVENOUS | Status: DC

## 2012-04-09 MED ORDER — VITAMIN D3 25 MCG (1000 UNIT) PO TABS
2000.0000 [IU] | ORAL_TABLET | Freq: Every day | ORAL | Status: DC
  Administered 2012-04-09 – 2012-04-11 (×3): 2000 [IU] via ORAL
  Filled 2012-04-09 (×3): qty 2

## 2012-04-09 MED ORDER — DOFETILIDE 250 MCG PO CAPS
250.0000 ug | ORAL_CAPSULE | Freq: Two times a day (BID) | ORAL | Status: DC
  Administered 2012-04-09 – 2012-04-11 (×5): 250 ug via ORAL
  Filled 2012-04-09 (×6): qty 1

## 2012-04-09 MED ORDER — WARFARIN SODIUM 2 MG PO TABS
2.0000 mg | ORAL_TABLET | ORAL | Status: DC
  Filled 2012-04-09: qty 1

## 2012-04-09 MED ORDER — EZETIMIBE 10 MG PO TABS
10.0000 mg | ORAL_TABLET | Freq: Every day | ORAL | Status: DC
  Administered 2012-04-09 – 2012-04-11 (×3): 10 mg via ORAL
  Filled 2012-04-09 (×3): qty 1

## 2012-04-09 MED ORDER — AZITHROMYCIN 250 MG PO TABS
500.0000 mg | ORAL_TABLET | Freq: Once | ORAL | Status: AC
  Administered 2012-04-09: 500 mg via ORAL
  Filled 2012-04-09: qty 2

## 2012-04-09 MED ORDER — WARFARIN SODIUM 3 MG PO TABS
3.0000 mg | ORAL_TABLET | ORAL | Status: DC
  Administered 2012-04-09: 3 mg via ORAL
  Filled 2012-04-09 (×2): qty 1

## 2012-04-09 MED ORDER — SODIUM CHLORIDE 0.9 % IJ SOLN
3.0000 mL | Freq: Two times a day (BID) | INTRAMUSCULAR | Status: DC
  Administered 2012-04-09 – 2012-04-11 (×4): 3 mL via INTRAVENOUS

## 2012-04-09 MED ORDER — ALBUTEROL SULFATE (5 MG/ML) 0.5% IN NEBU
2.5000 mg | INHALATION_SOLUTION | RESPIRATORY_TRACT | Status: DC | PRN
  Filled 2012-04-09: qty 0.5

## 2012-04-09 MED ORDER — SODIUM CHLORIDE 0.9 % IJ SOLN
3.0000 mL | Freq: Two times a day (BID) | INTRAMUSCULAR | Status: DC
  Administered 2012-04-09 – 2012-04-11 (×5): 3 mL via INTRAVENOUS

## 2012-04-09 MED ORDER — FUROSEMIDE 10 MG/ML IJ SOLN
20.0000 mg | Freq: Once | INTRAMUSCULAR | Status: AC
  Administered 2012-04-09: 20 mg via INTRAVENOUS
  Filled 2012-04-09: qty 2

## 2012-04-09 MED ORDER — GUAIFENESIN ER 600 MG PO TB12
600.0000 mg | ORAL_TABLET | Freq: Two times a day (BID) | ORAL | Status: DC
  Administered 2012-04-09 – 2012-04-11 (×5): 600 mg via ORAL
  Filled 2012-04-09 (×6): qty 1

## 2012-04-09 MED ORDER — VITAMIN D 50 MCG (2000 UT) PO TABS: 2000.0000 [IU] | ORAL_TABLET | Freq: Every day | ORAL | Status: DC

## 2012-04-09 MED ORDER — ACETAMINOPHEN 325 MG PO TABS: 650.0000 mg | ORAL_TABLET | Freq: Four times a day (QID) | ORAL | Status: DC | PRN

## 2012-04-09 MED ORDER — ACETAMINOPHEN 650 MG RE SUPP: 650.0000 mg | Freq: Four times a day (QID) | RECTAL | Status: DC | PRN

## 2012-04-09 MED ORDER — ALBUTEROL SULFATE (5 MG/ML) 0.5% IN NEBU
2.5000 mg | INHALATION_SOLUTION | Freq: Four times a day (QID) | RESPIRATORY_TRACT | Status: DC
  Administered 2012-04-09: 2.5 mg via RESPIRATORY_TRACT
  Filled 2012-04-09: qty 0.5

## 2012-04-09 MED ORDER — LORAZEPAM 0.5 MG PO TABS
0.5000 mg | ORAL_TABLET | Freq: Three times a day (TID) | ORAL | Status: DC | PRN
  Administered 2012-04-10: 0.5 mg via ORAL
  Filled 2012-04-09: qty 1

## 2012-04-09 MED ORDER — ONDANSETRON HCL 4 MG/2ML IJ SOLN: 4.0000 mg | Freq: Four times a day (QID) | INTRAMUSCULAR | Status: DC | PRN

## 2012-04-09 MED ORDER — IPRATROPIUM BROMIDE 0.02 % IN SOLN
0.5000 mg | Freq: Once | RESPIRATORY_TRACT | Status: AC
  Administered 2012-04-09: 0.5 mg via RESPIRATORY_TRACT
  Filled 2012-04-09: qty 2.5

## 2012-04-09 MED ORDER — IPRATROPIUM BROMIDE 0.02 % IN SOLN: 0.5000 mg | RESPIRATORY_TRACT | Status: DC | PRN

## 2012-04-09 MED ORDER — DEXTROSE 5 % IV SOLN
1.0000 g | Freq: Once | INTRAVENOUS | Status: AC
  Administered 2012-04-09: 1 g via INTRAVENOUS
  Filled 2012-04-09: qty 10

## 2012-04-09 MED ORDER — METOPROLOL TARTRATE 12.5 MG HALF TABLET
12.5000 mg | ORAL_TABLET | Freq: Two times a day (BID) | ORAL | Status: DC
  Administered 2012-04-09 – 2012-04-11 (×5): 12.5 mg via ORAL
  Filled 2012-04-09 (×6): qty 1

## 2012-04-09 MED ORDER — WARFARIN - PHARMACIST DOSING INPATIENT: Freq: Every day | Status: DC

## 2012-04-09 MED ORDER — DIGOXIN 125 MCG PO TABS
125.0000 ug | ORAL_TABLET | Freq: Every day | ORAL | Status: DC
  Administered 2012-04-09 – 2012-04-11 (×3): 125 ug via ORAL
  Filled 2012-04-09 (×3): qty 1

## 2012-04-09 MED ORDER — IPRATROPIUM BROMIDE 0.02 % IN SOLN
0.5000 mg | Freq: Four times a day (QID) | RESPIRATORY_TRACT | Status: DC
  Administered 2012-04-09: 0.5 mg via RESPIRATORY_TRACT
  Filled 2012-04-09 (×2): qty 2.5

## 2012-04-09 MED ORDER — DEXTROSE 5 % IV SOLN
500.0000 mg | INTRAVENOUS | Status: DC
  Administered 2012-04-10: 500 mg via INTRAVENOUS
  Filled 2012-04-09 (×2): qty 500

## 2012-04-09 MED ORDER — PANTOPRAZOLE SODIUM 40 MG PO TBEC
40.0000 mg | DELAYED_RELEASE_TABLET | Freq: Every day | ORAL | Status: DC
  Administered 2012-04-09 – 2012-04-11 (×3): 40 mg via ORAL
  Filled 2012-04-09 (×3): qty 1

## 2012-04-09 MED ORDER — ALBUTEROL SULFATE (5 MG/ML) 0.5% IN NEBU
5.0000 mg | INHALATION_SOLUTION | Freq: Once | RESPIRATORY_TRACT | Status: AC
  Administered 2012-04-09: 5 mg via RESPIRATORY_TRACT
  Filled 2012-04-09: qty 1

## 2012-04-09 MED ORDER — DEXTROSE 5 % IV SOLN
1.0000 g | INTRAVENOUS | Status: DC
  Administered 2012-04-09 – 2012-04-10 (×2): 1 g via INTRAVENOUS
  Filled 2012-04-09 (×2): qty 10

## 2012-04-09 MED ORDER — ONDANSETRON HCL 4 MG PO TABS: 4.0000 mg | ORAL_TABLET | Freq: Four times a day (QID) | ORAL | Status: DC | PRN

## 2012-04-09 MED ORDER — ALBUTEROL SULFATE (5 MG/ML) 0.5% IN NEBU: 2.5000 mg | INHALATION_SOLUTION | RESPIRATORY_TRACT | Status: DC | PRN

## 2012-04-09 NOTE — ED Notes (Signed)
Pt waiting on bed upstairs. Fine crackles heard bilateral lungs. Pt has a wet cough and denies chest pain.

## 2012-04-09 NOTE — H&P (Signed)
[name] is an 78 y.o. female.   PCP - Dr.Elizabeth Duanne Guess. Chief Complaint: Shortness of breath and cough. HPI: 78 year-old female with history of atrial fibrillation on Coumadin, CAD status post CABG status post stenting, hypertension, chronic systolic heart failure last EF measured was 35% presents to the ER because of ongoing cough and shortness of breath. Patient states the 40s ago she had come from [place] and since then she started out with some cough which became more productive and last evening became more short of breath and the EMS was called. Patient was initially placed on CPAP and chest x-ray shows bibasal infiltrates with congestion and patient also had significant leukocytosis. Patient has been admitted for pneumonia. Patient denies any chest pain and shortness of breath has improved at this time. Denies nausea vomiting or diarrhea.  Past Medical History  Diagnosis Date  . Coronary artery disease     Prior stent to RCA in 1996 and 2008; s/p CAB in 2009 with MV repair and Maze  . Atrial fibrillation     in sinus on Tikosyn  . CHF (congestive heart failure)     EF 35% per echo in 2009  . Hypertension   . Anxiety   . Depression   . Hyperlipidemia   . Mitral insufficiency     s/p MV repair 2009  . IDA (iron deficiency anemia)   . Subarachnoid hemorrhage   . LBBB (left bundle branch block)   . Chronic anticoagulation   . High risk medication use     Tikosyn  . Tachycardia-bradycardia     s/p PPM  . Pacemaker     Past Surgical History  Procedure Date  . Cardiac catheterization 03/19/2007    EF 45%. THERE IS 2+ MITRAL INSUFFICIENCY. THERE IS MODERATE INFERIOR WALL HYPOKINESIA WITH OVERALL MILD TO MODERATE LV DYSFUNCTION  . Coronary stent placement 1996, AND 03/2007    RIGHT CORONARY  . Removal of colon polyp     AND A LARGE CECAL POLYP  . Coronary artery bypass graft 04/2008    INCLUDING A MITRAL VALVE REPAIR AND A LEFT SIDED MAZE PROCEDURE  . Coronary artery  bypass graft     X2 with LIMA to LAD and SVG to PD and WITH A LEFT SIDED MAZE PROCEDURE & MITRAL VALVE REPAIR. THIS INCLUDED AN LIMA GRAFT TO THE LAD, AND A SAPHENOUS VEIN GRAFT TO THE PDA.  Marland Kitchen Transthoracic echocardiogram 06/21/2008    EF 35%  . Cardiac pacemaker placement   . Mitral valve repair     Family History  Problem Relation Age of Onset  . Heart attack Mother   . Cancer Father    Social History:  reports that she quit smoking about 15 years ago. She does not have any smokeless tobacco history on file. She reports that she does not drink alcohol or use illicit drugs.  Allergies:  Allergies  Allergen Reactions  . Amiodarone     unknown  . Diphenhydramine Hcl Other (See Comments)    unknown     (Not in a hospital admission)  Results for orders placed during the hospital encounter of 04/09/12 (from the past 48 hour(s))  CBC WITH DIFFERENTIAL     Status: Abnormal   Collection Time   04/09/12  3:09 AM      Component Value Range Comment   WBC 18.8 (*) 4.0 - 10.5 K/uL    RBC 4.90  3.87 - 5.11 MIL/uL    Hemoglobin 13.9  12.0 -  15.0 g/dL    HCT 16.1  09.6 - 04.5 %    MCV 86.9  78.0 - 100.0 fL    MCH 28.4  26.0 - 34.0 pg    MCHC 32.6  30.0 - 36.0 g/dL    RDW 40.9 (*) 81.1 - 15.5 %    Platelets 211  150 - 400 K/uL    Neutrophils Relative 91 (*) 43 - 77 %    Neutro Abs 17.0 (*) 1.7 - 7.7 K/uL    Lymphocytes Relative 5 (*) 12 - 46 %    Lymphs Abs 0.9  0.7 - 4.0 K/uL    Monocytes Relative 4  3 - 12 %    Monocytes Absolute 0.8  0.1 - 1.0 K/uL    Eosinophils Relative 0  0 - 5 %    Eosinophils Absolute 0.0  0.0 - 0.7 K/uL    Basophils Relative 0  0 - 1 %    Basophils Absolute 0.0  0.0 - 0.1 K/uL   PRO B NATRIURETIC PEPTIDE     Status: Abnormal   Collection Time   04/09/12  3:09 AM      Component Value Range Comment   Pro B Natriuretic peptide (BNP) 2530.0 (*) 0 - 450 pg/mL   DIGOXIN LEVEL     Status: Abnormal   Collection Time   04/09/12  3:10 AM      Component Value Range  Comment   Digoxin Level 0.5 (*) 0.8 - 2.0 ng/mL   PROTIME-INR     Status: Abnormal   Collection Time   04/09/12  3:10 AM      Component Value Range Comment   Prothrombin Time 24.0 (*) 11.6 - 15.2 seconds    INR 2.11 (*) 0.00 - 1.49   POCT I-STAT TROPONIN I     Status: Normal   Collection Time   04/09/12  3:17 AM      Component Value Range Comment   Troponin i, poc 0.00  0.00 - 0.08 ng/mL    Comment 3            POCT I-STAT, CHEM 8     Status: Abnormal   Collection Time   04/09/12  3:18 AM      Component Value Range Comment   Sodium 141  135 - 145 mEq/L    Potassium 4.1  3.5 - 5.1 mEq/L    Chloride 108  96 - 112 mEq/L    BUN 16  6 - 23 mg/dL    Creatinine, Ser 9.14  0.50 - 1.10 mg/dL    Glucose, Bld 782 (*) 70 - 99 mg/dL    Calcium, Ion 9.56 (*) 1.12 - 1.32 mmol/L    TCO2 21  0 - 100 mmol/L    Hemoglobin 15.0  12.0 - 15.0 g/dL    HCT 21.3  08.6 - 57.8 %    Dg Chest Portable 1 View  04/09/2012  *RADIOLOGY REPORT*  Clinical Data: Respiratory distress, shortness of breath.  PORTABLE CHEST - 1 VIEW  Comparison: 09/23/2008  Findings: Degraded by rotation.  Cardiomegaly. Mitral valve prosthesis.  Central vascular congestion.  Coarse interstitial markings.  Bibasilar opacities.  Status post median sternotomy. Left chest wall battery pack with lead tips projecting over the right atrium right ventricle. No acute osseous finding.  IMPRESSION: Cardiomegaly with central vascular congestion.  Coarse interstitial markings may reflect COPD. Superimposed interstitial edema suspected.  Bibasilar opacities; atelectasis versus infiltrate.  Original Report Authenticated By: Waneta Martins, M.D.  Review of Systems  Constitutional: Positive for fever.  HENT: Negative.   Eyes: Negative.   Respiratory: Positive for cough and shortness of breath.   Cardiovascular: Negative.   Gastrointestinal: Negative.   Genitourinary: Negative.   Musculoskeletal: Negative.   Skin: Negative.   Neurological:  Negative.   Endo/Heme/Allergies: Negative.   Psychiatric/Behavioral: Negative.     Blood pressure 120/53, pulse 95, temperature 98.6 F (37 C), temperature source Oral, resp. rate 21, SpO2 96.00%. Physical Exam  Constitutional: She is oriented to person, place, and time. She appears well-developed and well-nourished. No distress.  HENT:  Head: Normocephalic and atraumatic.  Right Ear: External ear normal.  Left Ear: External ear normal.  Nose: Nose normal.  Mouth/Throat: Oropharynx is clear and moist. No oropharyngeal exudate.  Eyes: Conjunctivae are normal. Pupils are equal, round, and reactive to light. Right eye exhibits no discharge. Left eye exhibits no discharge. No scleral icterus.  Neck: Normal range of motion. Neck supple.  Cardiovascular: Normal rate and regular rhythm.   Respiratory: Effort normal and breath sounds normal. No respiratory distress. She has no wheezes. She has no rales.  GI: Soft. Bowel sounds are normal. She exhibits no distension. There is no tenderness. There is no rebound.  Musculoskeletal: Normal range of motion. She exhibits no edema and no tenderness.  Neurological: She is alert and oriented to person, place, and time.       Moves all extremities.  Skin: Skin is warm and dry. She is not diaphoretic.  Psychiatric: Her behavior is normal.     Assessment/Plan #1. Pneumonia - treated this as community-acquired pneumonia. Patient has been started on ceftriaxone and Zithromax which we will continue. #2. History of CHF last EF measured at 35% - have ordered one dose of IV Lasix 20 mg. Further doses depending on patient's clinical course. Closely follow intake output and metabolic panel. #3. Atrial fibrillation status post pacemaker placement and is on Coumadin - Coumadin per pharmacy. Continue present medications. #4. CAD status post CABG - presently chest pain-free. #5. Previous history of severe smoking quit more than 10 years ago.  CODE STATUS - full  code.  Eduard Clos 04/09/2012, 6:23 AM

## 2012-04-09 NOTE — ED Notes (Signed)
Patient talking with admitting MD at bedside.  No distress noted.

## 2012-04-09 NOTE — ED Notes (Signed)
Patient up to bed side commode w/ assistance.  She was noted to have dizziness upon sitting but it resolved.  Patient with sob with activity.  Patient returned to bed.  Lasix given per admission orders. Patient denies need for ativan at this time.l

## 2012-04-09 NOTE — ED Notes (Signed)
Patient with noted shortness of breath at rest. Patient has frequent cough w/o production at this time.  Patient denies any chest pain.  No noted edema.  Patient reports she recently traveled and believes she got sick from the air in the car.  Patient is tachycardic at rest.  Patient is afebrile at this time.

## 2012-04-09 NOTE — ED Provider Notes (Signed)
History     CSN: [id number]  Arrival date & time 04/09/12  0224   First MD Initiated Contact with Patient 04/09/12 0234    Level V caveat, dyspnea  Chief Complaint  Patient presents with  . Shortness of Breath    (Consider location/radiation/quality/duration/timing/severity/associated sxs/prior treatment) HPI Complains of shortness of breath with cough, nonproductive onset yesterday, subjective fever. Other associated symptoms include generalized malaise. EMS treated patient with supplemental oxygen, CPAP, and one sublingual nitroglycerin with partial relief. No other associated symptoms. Patient felt as if she had a "cold" Past Medical History  Diagnosis Date  . Coronary artery disease     Prior stent to RCA in 1996 and 2008; s/p CAB in 2009 with MV repair and Maze  . Atrial fibrillation     in sinus on Tikosyn  . CHF (congestive heart failure)     EF 35% per echo in 2009  . Hypertension   . Anxiety   . Depression   . Hyperlipidemia   . Mitral insufficiency     s/p MV repair 2009  . IDA (iron deficiency anemia)   . Subarachnoid hemorrhage   . LBBB (left bundle branch block)   . Chronic anticoagulation   . High risk medication use     Tikosyn  . Tachycardia-bradycardia     s/p PPM  . Pacemaker     Past Surgical History  Procedure Date  . Cardiac catheterization 03/19/2007    EF 45%. THERE IS 2+ MITRAL INSUFFICIENCY. THERE IS MODERATE INFERIOR WALL HYPOKINESIA WITH OVERALL MILD TO MODERATE LV DYSFUNCTION  . Coronary stent placement 1996, AND 03/2007    RIGHT CORONARY  . Removal of colon polyp     AND A LARGE CECAL POLYP  . Coronary artery bypass graft 04/2008    INCLUDING A MITRAL VALVE REPAIR AND A LEFT SIDED MAZE PROCEDURE  . Coronary artery bypass graft     X2 with LIMA to LAD and SVG to PD and WITH A LEFT SIDED MAZE PROCEDURE & MITRAL VALVE REPAIR. THIS INCLUDED AN LIMA GRAFT TO THE LAD, AND A SAPHENOUS VEIN GRAFT TO THE PDA.  Marland Kitchen Transthoracic echocardiogram  06/21/2008    EF 35%  . Cardiac pacemaker placement   . Mitral valve repair     Family History  Problem Relation Age of Onset  . Heart attack Mother   . Cancer Father     History  Substance Use Topics  . Smoking status: Former Smoker    Quit date: 05/06/1996  . Smokeless tobacco: Not on file  . Alcohol Use: No    OB History    Grav Para Term Preterm Abortions TAB SAB Ect Mult Living                  Review of Systems  Unable to perform ROS: Other  Constitutional: Negative.   HENT: Negative.   Respiratory: Positive for cough and shortness of breath.   Cardiovascular: Negative.   Gastrointestinal: Negative.   Musculoskeletal: Negative.   Skin: Negative.   Neurological: Negative.   Hematological: Negative.   Psychiatric/Behavioral: Negative.     Allergies  Amiodarone and Diphenhydramine hcl  Home Medications   Current Outpatient Rx  Name Route Sig Dispense Refill  . VITAMIN D 2000 UNITS PO TABS Oral Take 2,000 Units by mouth daily.      Marland Kitchen DIGOXIN 0.125 MG PO TABS Oral Take 1 tablet (125 mcg total) by mouth daily. 90 tablet 3  . DOFETILIDE 250 MCG PO  CAPS Oral Take 1 capsule (250 mcg total) by mouth 2 (two) times daily. 60 capsule 1  . EZETIMIBE 10 MG PO TABS Oral Take 1 tablet (10 mg total) by mouth daily. 90 tablet 3  . LORAZEPAM 0.5 MG PO TABS Oral Take 1 tablet (0.5 mg total) by mouth every 8 (eight) hours as needed. 30 tablet 5  . METOPROLOL TARTRATE 25 MG PO TABS Oral Take 0.5 tablets (12.5 mg total) by mouth 2 (two) times daily. 90 tablet 3  . PANTOPRAZOLE SODIUM 40 MG PO TBEC Oral Take 1 tablet (40 mg total) by mouth daily. 90 tablet 3  . WARFARIN SODIUM 1 MG PO TABS  Take as directed by anticoagulation clinic 90 tablet 3    30 day supply  . WARFARIN SODIUM 1 MG PO TABS  TAKE AS DIRECTED BY ANTICOAGULATION CLINIC 90 tablet 3    BP 151/79  Pulse 112  Temp 99.6 F (37.6 C) (Rectal)  SpO2 92%  Physical Exam  Nursing note and vitals  reviewed. Constitutional: She appears well-developed and well-nourished. She appears distressed.       Mild respiratory distress speaks in sentences  HENT:  Head: Normocephalic and atraumatic.  Eyes: Conjunctivae are normal. Pupils are equal, round, and reactive to light.  Neck: Neck supple. No tracheal deviation present. No thyromegaly present.  Cardiovascular: Normal rate and regular rhythm.   No murmur heard. Pulmonary/Chest: She is in respiratory distress.       Mild respiratory distress, coughing frequently, speaks in short sentences  Abdominal: Soft. Bowel sounds are normal. She exhibits no distension. There is no tenderness.  Musculoskeletal: Normal range of motion. She exhibits no edema and no tenderness.  Neurological: She is alert. Coordination normal.  Skin: Skin is warm and dry. No rash noted.  Psychiatric: She has a normal mood and affect.    Date: 04/09/2012  Rate: 110  Rhythm: sinus tachycardia  QRS Axis: normal  Intervals: normal  ST/T Wave abnormalities: normal  Conduction Disutrbances:left bundle branch block  Narrative Interpretation:   Old EKG Reviewed: Tracing from 05/25/2008 was intermittently paced otherwise left bundle branch pattern and no significant changes interpreted by me  ED Course  Procedures (including critical care time)   Labs Reviewed  DIGOXIN LEVEL  APTT  PROTIME-INR  PRO B NATRIURETIC PEPTIDE  CBC WITH DIFFERENTIAL   Dg Chest Portable 1 View  04/09/2012  *RADIOLOGY REPORT*  Clinical Data: Respiratory distress, shortness of breath.  PORTABLE CHEST - 1 VIEW  Comparison: 09/23/2008  Findings: Degraded by rotation.  Cardiomegaly. Mitral valve prosthesis.  Central vascular congestion.  Coarse interstitial markings.  Bibasilar opacities.  Status post median sternotomy. Left chest wall battery pack with lead tips projecting over the right atrium right ventricle. No acute osseous finding.  IMPRESSION: Cardiomegaly with central vascular congestion.   Coarse interstitial markings may reflect COPD. Superimposed interstitial edema suspected.  Bibasilar opacities; atelectasis versus infiltrate.  Original Report Authenticated By: Waneta Martins, M.D.   Results for orders placed during the hospital encounter of 04/09/12  DIGOXIN LEVEL      Component Value Range   Digoxin Level 0.5 (*) 0.8 - 2.0 ng/mL  PROTIME-INR      Component Value Range   Prothrombin Time 24.0 (*) 11.6 - 15.2 seconds   INR 2.11 (*) 0.00 - 1.49  CBC WITH DIFFERENTIAL      Component Value Range   WBC 18.8 (*) 4.0 - 10.5 K/uL   RBC 4.90  3.87 - 5.11 MIL/uL  Hemoglobin 13.9  12.0 - 15.0 g/dL   HCT 11.9  14.7 - 82.9 %   MCV 86.9  78.0 - 100.0 fL   MCH 28.4  26.0 - 34.0 pg   MCHC 32.6  30.0 - 36.0 g/dL   RDW 56.2 (*) 13.0 - 86.5 %   Platelets 211  150 - 400 K/uL   Neutrophils Relative 91 (*) 43 - 77 %   Neutro Abs 17.0 (*) 1.7 - 7.7 K/uL   Lymphocytes Relative 5 (*) 12 - 46 %   Lymphs Abs 0.9  0.7 - 4.0 K/uL   Monocytes Relative 4  3 - 12 %   Monocytes Absolute 0.8  0.1 - 1.0 K/uL   Eosinophils Relative 0  0 - 5 %   Eosinophils Absolute 0.0  0.0 - 0.7 K/uL   Basophils Relative 0  0 - 1 %   Basophils Absolute 0.0  0.0 - 0.1 K/uL  PRO B NATRIURETIC PEPTIDE      Component Value Range   Pro B Natriuretic peptide (BNP) 2530.0 (*) 0 - 450 pg/mL  POCT I-STAT, CHEM 8      Component Value Range   Sodium 141  135 - 145 mEq/L   Potassium 4.1  3.5 - 5.1 mEq/L   Chloride 108  96 - 112 mEq/L   BUN 16  6 - 23 mg/dL   Creatinine, Ser 7.84  0.50 - 1.10 mg/dL   Glucose, Bld 696 (*) 70 - 99 mg/dL   Calcium, Ion 2.95 (*) 1.12 - 1.32 mmol/L   TCO2 21  0 - 100 mmol/L   Hemoglobin 15.0  12.0 - 15.0 g/dL   HCT 28.4  13.2 - 44.0 %  POCT I-STAT TROPONIN I      Component Value Range   Troponin i, poc 0.00  0.00 - 0.08 ng/mL   Comment 3            Dg Chest Portable 1 View  04/09/2012  *RADIOLOGY REPORT*  Clinical Data: Respiratory distress, shortness of breath.  PORTABLE CHEST  - 1 VIEW  Comparison: 09/23/2008  Findings: Degraded by rotation.  Cardiomegaly. Mitral valve prosthesis.  Central vascular congestion.  Coarse interstitial markings.  Bibasilar opacities.  Status post median sternotomy. Left chest wall battery pack with lead tips projecting over the right atrium right ventricle. No acute osseous finding.  IMPRESSION: Cardiomegaly with central vascular congestion.  Coarse interstitial markings may reflect COPD. Superimposed interstitial edema suspected.  Bibasilar opacities; atelectasis versus infiltrate.  Original Report Authenticated By: Waneta Martins, M.D.     No diagnosis found.   Chest x-ray reviewed by me MDM  Symptoms more consistent with infectious etiology than CHF given leukocytosis and cough. Patient exhibits no orthopnea, no JVD and no peripheral edema Case discussed with Dr.Kakrakandy Plan admit step down unit oxygen , antibiotics Diagnosis #1 acute dyspnea #2 hypoxia  CRITICAL CARE Performed by: Doug Sou   Total critical care time: 30 minute  Critical care time was exclusive of separately billable procedures and treating other patients.  Critical care was necessary to treat or prevent imminent or life-threatening deterioration.  Critical care was time spent personally by me on the following activities: development of treatment plan with patient and/or surrogate as well as nursing, discussions with consultants, evaluation of patient's response to treatment, examination of patient, obtaining history from patient or surrogate, ordering and performing treatments and interventions, ordering and review of laboratory studies, ordering and review of radiographic studies, pulse oximetry and re-evaluation  of patient's condition.  Doug Sou, MD 04/09/12 [phone]

## 2012-04-09 NOTE — Progress Notes (Signed)
Subjective: No new issues, coughing frequently.  Objective: Vital signs in last 24 hours: Temp:  [98.6 F (37 C)-99.6 F (37.6 C)] 98.6 F (37 C) (07/03 0538) Pulse Rate:  [95-117] 95  (07/03 0530) Resp:  [20-45] 21  (07/03 0530) BP: (116-151)/(48-79) 120/53 mmHg (07/03 0500) SpO2:  [89 %-99 %] 96 % (07/03 0530) Weight change:     Intake/Output from previous day:   Total I/O In: 200 [P.O.:200] Out: 100 [Urine:100]   Physical Exam: General: Comfortable, alert, communicative, fully oriented, not short of breath at rest.  HEENT:  No clinical pallor, no jaundice, no conjunctival injection or discharge. Hydration status is fair. NECK:  Supple, JVP not seen, no carotid bruits, no palpable lymphadenopathy, no palpable goiter. CHEST:  Occasional expiratory rhonchi, reduced air-entry both bases, no crackles. HEART:  Sounds 1 and 2 heard, normal, regular, no murmurs. ABDOMEN:  Full, soft, non-tender, no palpable organomegaly, no palpable masses, normal bowel sounds. GENITALIA:  Not examined. LOWER EXTREMITIES:  No pitting edema, palpable peripheral pulses. MUSCULOSKELETAL SYSTEM:  Generalized osteoarthritic changes, otherwise, normal. CENTRAL NERVOUS SYSTEM:  No focal neurologic deficit on gross examination.  Lab Results:  Basename 04/09/12 0318 04/09/12 0309  WBC -- 18.8*  HGB 15.0 13.9  HCT 44.0 42.6  PLT -- 211    Basename 04/09/12 0318  NA 141  K 4.1  CL 108  CO2 --  GLUCOSE 199*  BUN 16  CREATININE 0.70  CALCIUM --   No results found for this or any previous visit (from the past 240 hour(s)).   Studies/Results: Dg Chest Portable 1 View  04/09/2012  *RADIOLOGY REPORT*  Clinical Data: Respiratory distress, shortness of breath.  PORTABLE CHEST - 1 VIEW  Comparison: 09/23/2008  Findings: Degraded by rotation.  Cardiomegaly. Mitral valve prosthesis.  Central vascular congestion.  Coarse interstitial markings.  Bibasilar opacities.  Status post median sternotomy. Left  chest wall battery pack with lead tips projecting over the right atrium right ventricle. No acute osseous finding.  IMPRESSION: Cardiomegaly with central vascular congestion.  Coarse interstitial markings may reflect COPD. Superimposed interstitial edema suspected.  Bibasilar opacities; atelectasis versus infiltrate.  Original Report Authenticated By: Waneta Martins, M.D.    Medications: Scheduled Meds:   . albuterol  5 mg Nebulization Once  . azithromycin  500 mg Oral Once  . cefTRIAXone (ROCEPHIN)  IV  1 g Intravenous Once  . furosemide  20 mg Intravenous Once  . ipratropium  0.5 mg Nebulization Once   Continuous Infusions:  PRN Meds:.  Assessment/Plan:  1. Pneumonia: This is community-acquired pneumonia. Patient is on Ceftriaxone/Azithromycin, day# 1.  2. History of CHF: Patient has known history of ischemic CMP/systolic CHF with EF 35%. She was administered one dose of IV Lasix 20 mg by admitting MD, but clinically, does not appear acutely decompensated. Will monitor BNP.  3. Atrial fibrillation/Tachy-Brady syndrome:  Status post permanent pacemaker. On Digoxin and Lopressor, as well as chronic Coumadin anticoagulation. Managing anticoagulation per pharmacy. Continue present medications.  4. CAD status post CABG. Appears stable/asymptomatic. Presently chest pain-free.     LOS: 0 days   [name],CHRISTOPHER 04/09/2012, 7:35 AM

## 2012-04-09 NOTE — Progress Notes (Signed)
ANTICOAGULATION CONSULT NOTE - Initial Consult  Pharmacy Consult for coumadin Indication: atrial fibrillation  Allergies  Allergen Reactions  . Amiodarone     unknown  . Diphenhydramine Hcl Other (See Comments)    unknown   Vital Signs: Temp: 98.6 F (37 C) (07/03 0538) Temp src: Oral (07/03 0538) BP: 122/54 mmHg (07/03 0730) Pulse Rate: 92  (07/03 0730)  Labs:  Basename 04/09/12 0318 04/09/12 0310 04/09/12 0309  HGB 15.0 -- 13.9  HCT 44.0 -- 42.6  PLT -- -- 211  APTT -- -- --  LABPROT -- 24.0* --  INR -- 2.11* --  HEPARINUNFRC -- -- --  CREATININE 0.70 -- --  CKTOTAL -- -- --  CKMB -- -- --  TROPONINI -- -- --    The CrCl is unknown because both a height and weight (above a minimum accepted value) are required for this calculation.   Medical History: Past Medical History  Diagnosis Date  . Coronary artery disease     Prior stent to RCA in 1996 and 2008; s/p CAB in 2009 with MV repair and Maze  . Atrial fibrillation     in sinus on Tikosyn  . CHF (congestive heart failure)     EF 35% per echo in 2009  . Hypertension   . Anxiety   . Depression   . Hyperlipidemia   . Mitral insufficiency     s/p MV repair 2009  . IDA (iron deficiency anemia)   . Subarachnoid hemorrhage   . LBBB (left bundle branch block)   . Chronic anticoagulation   . High risk medication use     Tikosyn  . Tachycardia-bradycardia     s/p PPM  . Pacemaker     Medications:  Scheduled:    . ipratropium  0.5 mg Nebulization Q6H   And  . albuterol  2.5 mg Nebulization Q6H  . albuterol  5 mg Nebulization Once  . azithromycin  500 mg Intravenous Q24H  . azithromycin  500 mg Oral Once  . cefTRIAXone (ROCEPHIN)  IV  1 g Intravenous Once  . cefTRIAXone (ROCEPHIN)  IV  1 g Intravenous Q24H  . cholecalciferol  2,000 Units Oral Daily  . digoxin  125 mcg Oral Daily  . dofetilide  250 mcg Oral BID  . ezetimibe  10 mg Oral Daily  . furosemide  20 mg Intravenous Once  . guaiFENesin  600  mg Oral BID  . ipratropium  0.5 mg Nebulization Once  . LORazepam  0.5 mg Oral Q8H  . metoprolol tartrate  12.5 mg Oral BID  . pantoprazole  40 mg Oral Daily  . sodium chloride  3 mL Intravenous Q12H  . sodium chloride  3 mL Intravenous Q12H  . DISCONTD: cefTRIAXone (ROCEPHIN)  IV  1 g Intravenous Q24H  . DISCONTD: Vitamin D  2,000 Units Oral Daily    Assessment: 78 yo who was admitted for CAP. She has been on chronic coumadin for afib. Her INR this AM is therapeutic.  Coumadin will be resumed inpatient.   Goal of Therapy:  INR 2-3 Monitor platelets by anticoagulation protocol: Yes   Plan:  1. Coumadin 3mg  qday except 2mg  Tues/Thurs 2. Daily PT/INR  Ulyses Southward [facility] 04/09/2012,9:03 AM

## 2012-04-09 NOTE — ED Notes (Signed)
Note that urine has strong odor

## 2012-04-09 NOTE — Progress Notes (Signed)
Utilization review completed.  

## 2012-04-09 NOTE — ED Notes (Signed)
PT recently went to [place] got back and had cough/ cold yesterday. Called EMS tonight to home due to SOB, Rails on Left side; junky cough. EMS gave 1 SL nitro; pt on bipap. Pt is anxious and reporting she feels awful and cannot breathe. MD and RN at bedside.

## 2012-04-09 NOTE — Progress Notes (Signed)
Visited pt room per referral from Pharm staff "pt is lonely".  Knocked and entered room and introduced myself,  Pt was sleeping and thanked me for letting her rest.  Will follow-up as needed or requested.

## 2012-04-10 DIAGNOSIS — R0989 Other specified symptoms and signs involving the circulatory and respiratory systems: Secondary | ICD-10-CM

## 2012-04-10 LAB — CBC
Hemoglobin: 12.2 g/dL (ref 12.0–15.0)
MCH: 27.8 pg (ref 26.0–34.0)
RBC: 4.39 MIL/uL (ref 3.87–5.11)

## 2012-04-10 LAB — COMPREHENSIVE METABOLIC PANEL
ALT: 17 U/L (ref 0–35)
Alkaline Phosphatase: 66 U/L (ref 39–117)
CO2: 27 mEq/L (ref 19–32)
Calcium: 8.9 mg/dL (ref 8.4–10.5)
GFR calc Af Amer: >90 mL/min (ref >90)
GFR calc non Af Amer: 78 mL/min — ABNORMAL LOW (ref >90)
Glucose, Bld: 88 mg/dL (ref 70–99)
Sodium: 139 mEq/L (ref 135–145)
Total Bilirubin: 0.5 mg/dL (ref 0.3–1.2)

## 2012-04-10 LAB — PRO B NATRIURETIC PEPTIDE: Pro B Natriuretic peptide (BNP): 1586 pg/mL — ABNORMAL HIGH (ref 0–450)

## 2012-04-10 MED ORDER — FUROSEMIDE 20 MG PO TABS
20.0000 mg | ORAL_TABLET | Freq: Every day | ORAL | Status: DC
  Administered 2012-04-11: 20 mg via ORAL
  Filled 2012-04-10: qty 1

## 2012-04-10 MED ORDER — FLUCONAZOLE 100 MG PO TABS
100.0000 mg | ORAL_TABLET | Freq: Every day | ORAL | Status: DC
  Administered 2012-04-10 – 2012-04-11 (×2): 100 mg via ORAL
  Filled 2012-04-10 (×2): qty 1

## 2012-04-10 MED ORDER — WARFARIN SODIUM 3 MG PO TABS
3.0000 mg | ORAL_TABLET | Freq: Once | ORAL | Status: AC
  Administered 2012-04-10: 3 mg via ORAL
  Filled 2012-04-10: qty 1

## 2012-04-10 MED ORDER — POLYETHYLENE GLYCOL 3350 17 G PO PACK
17.0000 g | PACK | Freq: Every day | ORAL | Status: DC
  Administered 2012-04-11: 17 g via ORAL
  Filled 2012-04-10 (×2): qty 1

## 2012-04-10 MED ORDER — FUROSEMIDE 10 MG/ML IJ SOLN
20.0000 mg | Freq: Once | INTRAMUSCULAR | Status: AC
  Administered 2012-04-10: 20 mg via INTRAVENOUS
  Filled 2012-04-10: qty 2

## 2012-04-10 MED ORDER — AZITHROMYCIN 250 MG PO TABS
250.0000 mg | ORAL_TABLET | Freq: Every day | ORAL | Status: DC
  Administered 2012-04-10 – 2012-04-11 (×2): 250 mg via ORAL
  Filled 2012-04-10 (×2): qty 1

## 2012-04-10 MED ORDER — WARFARIN SODIUM 2 MG PO TABS: 2.0000 mg | ORAL_TABLET | ORAL | Status: DC

## 2012-04-10 NOTE — Progress Notes (Signed)
ANTICOAGULATION CONSULT NOTE - Initial Consult  Pharmacy Consult for coumadin Indication: atrial fibrillation  Allergies  Allergen Reactions  . Amiodarone     unknown  . Diphenhydramine Hcl Other (See Comments)    unknown   Vital Signs: Temp: 98 F (36.7 C) (07/04 0446) Temp src: Oral (07/04 0446) BP: 110/57 mmHg (07/04 0446) Pulse Rate: 70  (07/04 0446)  Labs:  Alvira Philips 04/10/12 0521 04/09/12 0818 04/09/12 0318 04/09/12 0310 04/09/12 0309  HGB 12.2 13.8 -- -- --  HCT 38.4 42.2 44.0 -- --  PLT 184 202 -- -- 211  APTT -- -- -- -- --  LABPROT 21.7* -- -- 24.0* --  INR 1.85* -- -- 2.11* --  HEPARINUNFRC -- -- -- -- --  CREATININE 0.77 0.76 0.70 -- --  CKTOTAL -- 74 -- -- --  CKMB -- 2.9 -- -- --  TROPONINI -- <0.30 -- -- --    Estimated Creatinine Clearance: 51.3 ml/min (by C-G formula based on Cr of 0.77).   Medical History: Past Medical History  Diagnosis Date  . Coronary artery disease     Prior stent to RCA in 1996 and 2008; s/p CAB in 2009 with MV repair and Maze  . Atrial fibrillation     in sinus on Tikosyn  . CHF (congestive heart failure)     EF 35% per echo in 2009  . Hypertension   . Anxiety   . Depression   . Hyperlipidemia   . Mitral insufficiency     s/p MV repair 2009  . IDA (iron deficiency anemia)   . Subarachnoid hemorrhage   . LBBB (left bundle branch block)   . Chronic anticoagulation   . High risk medication use     Tikosyn  . Tachycardia-bradycardia     s/p PPM  . Pacemaker     Medications:  Scheduled:     . azithromycin  500 mg Intravenous Q24H  . cefTRIAXone (ROCEPHIN)  IV  1 g Intravenous Q24H  . cholecalciferol  2,000 Units Oral Daily  . digoxin  125 mcg Oral Daily  . dofetilide  250 mcg Oral BID  . ezetimibe  10 mg Oral Daily  . guaiFENesin  600 mg Oral BID  . metoprolol tartrate  12.5 mg Oral BID  . pantoprazole  40 mg Oral Daily  . sodium chloride  3 mL Intravenous Q12H  . sodium chloride  3 mL Intravenous Q12H    . warfarin  2 mg Oral Custom  . warfarin  3 mg Oral Custom  . Warfarin - Pharmacist Dosing Inpatient   Does not apply q1800  . DISCONTD: albuterol  2.5 mg Nebulization Q6H  . DISCONTD: ipratropium  0.5 mg Nebulization Q6H  . DISCONTD: LORazepam  0.5 mg Oral Q8H  . DISCONTD: Vitamin D  2,000 Units Oral Daily  . DISCONTD: warfarin  2 mg Oral Custom    Assessment: 78 yo who was admitted for CAP. She has been on chronic coumadin for afib. INR subtherapeutic this AM.  Will try to give a slightly higher dose today then resume home dose.   Goal of Therapy:  INR 2-3 Monitor platelets by anticoagulation protocol: Yes   Plan:  1. Coumadin 3mg  PO x1 today then Coumadin 3mg  qday except 2mg  Tues/Thurs 2. Daily PT/INR  Ulyses Southward [place] 04/10/2012,8:50 AM

## 2012-04-10 NOTE — Progress Notes (Addendum)
Subjective: Feels better. Phlegm is productive of yellow phlegm.   Objective: Vital signs in last 24 hours: Temp:  [98 F (36.7 C)-98.9 F (37.2 C)] 98 F (36.7 C) (07/04 0446) Pulse Rate:  [69-88] 69  (07/04 0925) Resp:  [18-20] 18  (07/04 0446) BP: (100-123)/(44-61) 113/44 mmHg (07/04 0925) SpO2:  [98 %-100 %] 100 % (07/04 0446) Weight:  [65.273 kg (143 lb 14.4 oz)] 65.273 kg (143 lb 14.4 oz) (07/04 0446) Weight change:  Last BM Date: 04/08/12  Intake/Output from previous day: 07/03 0701 - 07/04 0700 In: 1193 [P.O.:1140; I.V.:3; IV Piggyback:50] Out: 1360 [Urine:1360] Total I/O In: 463 [P.O.:460; I.V.:3] Out: 900 [Urine:900]   Physical Exam: General: Comfortable, alert, communicative, fully oriented, not short of breath at rest.  HEENT:  No clinical pallor, no jaundice, no conjunctival injection or discharge. Hydration status is fair. Has oral thrush.  NECK:  Supple, JVP not seen, no carotid bruits, no palpable lymphadenopathy, no palpable goiter. CHEST:  Coarse crackles left base, changing with cough. No rhonchi. HEART:  Sounds 1 and 2 heard, normal, regular, no murmurs. ABDOMEN:  Full, soft, non-tender, no palpable organomegaly, no palpable masses, normal bowel sounds. GENITALIA:  Not examined. LOWER EXTREMITIES:  No pitting edema, palpable peripheral pulses. MUSCULOSKELETAL SYSTEM:  Generalized osteoarthritic changes, otherwise, normal. CENTRAL NERVOUS SYSTEM:  No focal neurologic deficit on gross examination.  Lab Results:  Basename 04/10/12 0521 04/09/12 0818  WBC 7.8 15.7*  HGB 12.2 13.8  HCT 38.4 42.2  PLT 184 202    Basename 04/10/12 0521 04/09/12 0818  NA 139 140  K 4.2 4.0  CL 103 100  CO2 27 27  GLUCOSE 88 146*  BUN 14 15  CREATININE 0.77 0.76  CALCIUM 8.9 8.9   Recent Results (from the past 240 hour(s))  CULTURE, BLOOD (ROUTINE X 2)     Status: Normal (Preliminary result)   Collection Time   04/09/12  2:45 AM      Component Value Range Status  Comment   Specimen Description BLOOD RIGHT HAND   Final    Special Requests BOTTLES DRAWN AEROBIC AND ANAEROBIC 5CCB, 2CCR   Final    Culture  Setup Time 04/09/2012 09:34   Final    Culture     Final    Value:        BLOOD CULTURE RECEIVED NO GROWTH TO DATE CULTURE WILL BE HELD FOR 5 DAYS BEFORE ISSUING A FINAL NEGATIVE REPORT   Report Status PENDING   Incomplete   CULTURE, BLOOD (ROUTINE X 2)     Status: Normal (Preliminary result)   Collection Time   04/09/12  3:15 AM      Component Value Range Status Comment   Specimen Description BLOOD RIGHT HAND   Final    Special Requests BOTTLES DRAWN AEROBIC AND ANAEROBIC 5CC   Final    Culture  Setup Time 04/09/2012 09:34   Final    Culture     Final    Value:        BLOOD CULTURE RECEIVED NO GROWTH TO DATE CULTURE WILL BE HELD FOR 5 DAYS BEFORE ISSUING A FINAL NEGATIVE REPORT   Report Status PENDING   Incomplete      Studies/Results: Dg Chest Portable 1 View  04/09/2012  *RADIOLOGY REPORT*  Clinical Data: Respiratory distress, shortness of breath.  PORTABLE CHEST - 1 VIEW  Comparison: 09/23/2008  Findings: Degraded by rotation.  Cardiomegaly. Mitral valve prosthesis.  Central vascular congestion.  Coarse interstitial markings.  Bibasilar  opacities.  Status post median sternotomy. Left chest wall battery pack with lead tips projecting over the right atrium right ventricle. No acute osseous finding.  IMPRESSION: Cardiomegaly with central vascular congestion.  Coarse interstitial markings may reflect COPD. Superimposed interstitial edema suspected.  Bibasilar opacities; atelectasis versus infiltrate.  Original Report Authenticated By: Waneta Martins, M.D.    Medications: Scheduled Meds:    . azithromycin  500 mg Intravenous Q24H  . cefTRIAXone (ROCEPHIN)  IV  1 g Intravenous Q24H  . cholecalciferol  2,000 Units Oral Daily  . digoxin  125 mcg Oral Daily  . dofetilide  250 mcg Oral BID  . ezetimibe  10 mg Oral Daily  . guaiFENesin  600 mg Oral  BID  . metoprolol tartrate  12.5 mg Oral BID  . pantoprazole  40 mg Oral Daily  . sodium chloride  3 mL Intravenous Q12H  . sodium chloride  3 mL Intravenous Q12H  . warfarin  2 mg Oral Custom  . warfarin  3 mg Oral Custom  . warfarin  3 mg Oral ONCE-1800  . Warfarin - Pharmacist Dosing Inpatient   Does not apply q1800  . DISCONTD: albuterol  2.5 mg Nebulization Q6H  . DISCONTD: ipratropium  0.5 mg Nebulization Q6H  . DISCONTD: LORazepam  0.5 mg Oral Q8H  . DISCONTD: warfarin  2 mg Oral Custom   Continuous Infusions:  PRN Meds:.acetaminophen, acetaminophen, albuterol, albuterol, ipratropium, LORazepam, ondansetron (ZOFRAN) IV, ondansetron  Assessment/Plan:  1. Pneumonia: This is community-acquired pneumonia. Patient is on Ceftriaxone/Azithromycin, day# 2. Clinically appreciably improved. No pyrexia, and wcc has already normalized. Luanna Cole shall change Azithromycin to oral and repeat CXR on 04/11/12. .  2. History of CHF: Patient has known history of ischemic CMP/systolic CHF with EF 35%. She was administered one dose of IV Lasix 20 mg on 04/09/12, by admitting MD, but clinically, does not appear acutely decompensated. BNP was elevated at 5069 on 04/09/12, and is now 1586 today. Will give another 20 mg Lasix today, and then start oral Lasix from 04/11/12.   3. Atrial fibrillation/Tachy-Brady syndrome:  Status post permanent pacemaker. On Digoxin and Lopressor, as well as chronic Coumadin anticoagulation. Managing anticoagulation per pharmacy. Continue present medications.  4. CAD status post CABG. Appears stable/asymptomatic. Presently chest pain-free.  5. Oral thrush: Incidental finding on physical examoination. Have started 7-day course of Diflucan.   Comment: OOB.  LOS: 1 day   Cesar Rogerson,CHRISTOPHER 04/10/2012, 11:03 AM

## 2012-04-11 ENCOUNTER — Inpatient Hospital Stay (HOSPITAL_COMMUNITY): Payer: Medicare Other

## 2012-04-11 ENCOUNTER — Encounter (HOSPITAL_COMMUNITY): Payer: Medicare Other

## 2012-04-11 LAB — BASIC METABOLIC PANEL
CO2: 28 mEq/L (ref 19–32)
Calcium: 9 mg/dL (ref 8.4–10.5)
Creatinine, Ser: 0.74 mg/dL (ref 0.50–1.10)
GFR calc Af Amer: >90 mL/min (ref >90)
Sodium: 140 mEq/L (ref 135–145)

## 2012-04-11 LAB — CBC
MCH: 27.9 pg (ref 26.0–34.0)
MCV: 86.8 fL (ref 78.0–100.0)
Platelets: 182 10*3/uL (ref 150–400)
RBC: 4.48 MIL/uL (ref 3.87–5.11)
RDW: 15.4 % (ref 11.5–15.5)
WBC: 9.3 10*3/uL (ref 4.0–10.5)

## 2012-04-11 LAB — PROTIME-INR: Prothrombin Time: 20.7 seconds — ABNORMAL HIGH (ref 11.6–15.2)

## 2012-04-11 MED ORDER — MOXIFLOXACIN HCL 400 MG PO TABS: 400.0000 mg | ORAL_TABLET | Freq: Every day | ORAL | Status: AC

## 2012-04-11 MED ORDER — GUAIFENESIN ER 600 MG PO TB12: 600.0000 mg | ORAL_TABLET | Freq: Two times a day (BID) | ORAL | Status: AC

## 2012-04-11 MED ORDER — FLUCONAZOLE 100 MG PO TABS: 100.0000 mg | ORAL_TABLET | Freq: Every day | ORAL | Status: AC

## 2012-04-11 MED ORDER — FUROSEMIDE 20 MG PO TABS: 20.0000 mg | ORAL_TABLET | Freq: Every day | ORAL | Status: DC

## 2012-04-11 MED ORDER — WARFARIN SODIUM 4 MG PO TABS
4.0000 mg | ORAL_TABLET | Freq: Once | ORAL | Status: DC
  Filled 2012-04-11: qty 1

## 2012-04-11 MED ORDER — POTASSIUM CHLORIDE ER 10 MEQ PO TBCR: 10.0000 meq | EXTENDED_RELEASE_TABLET | Freq: Two times a day (BID) | ORAL | Status: DC

## 2012-04-11 NOTE — Progress Notes (Signed)
D/c instructions given to pt by CN, Jaunita.  Amanda Pea, Charity fundraiser.

## 2012-04-11 NOTE — Plan of Care (Signed)
Problem: Discharge Progression Outcomes Goal: Pain controlled with appropriate interventions Outcome: Completed/Met Date Met:  04/11/12 Pt denies pain today

## 2012-04-11 NOTE — Discharge Summary (Signed)
Physician Discharge Summary  Patient ID: [name] MRN: [id number] DOB/AGE: [birth year] 78 y.o.  Admit date: 04/09/2012 Discharge date: 04/11/2012  Primary Care Physician:  Maryelizabeth Rowan, MD   Discharge Diagnoses:    Patient Active Problem List  Diagnosis  . CAD  . Atrial fibrillation  . BRADYCARDIA  . Systolic CHF, chronic  . Somatic complaints, multiple  . Hypercholesteremia  . Community acquired pneumonia    Medication List  As of 04/11/2012 12:22 PM   TAKE these medications         digoxin 0.125 MG tablet   Commonly known as: LANOXIN   Take 1 tablet (125 mcg total) by mouth daily.      dofetilide 250 MCG capsule   Commonly known as: TIKOSYN   Take 1 capsule (250 mcg total) by mouth 2 (two) times daily.      ezetimibe 10 MG tablet   Commonly known as: ZETIA   Take 1 tablet (10 mg total) by mouth daily.      fluconazole 100 MG tablet   Commonly known as: DIFLUCAN   Take 1 tablet (100 mg total) by mouth daily.      furosemide 20 MG tablet   Commonly known as: LASIX   Take 1 tablet (20 mg total) by mouth daily.      guaiFENesin 600 MG 12 hr tablet   Commonly known as: MUCINEX   Take 1 tablet (600 mg total) by mouth 2 (two) times daily.      LORazepam 0.5 MG tablet   Commonly known as: ATIVAN   Take 0.5 mg by mouth every 8 (eight) hours as needed.      metoprolol tartrate 25 MG tablet   Commonly known as: LOPRESSOR   Take 0.5 tablets (12.5 mg total) by mouth 2 (two) times daily.      moxifloxacin 400 MG tablet   Commonly known as: AVELOX   Take 1 tablet (400 mg total) by mouth daily.      pantoprazole 40 MG tablet   Commonly known as: PROTONIX   Take 1 tablet (40 mg total) by mouth daily.      potassium chloride 10 MEQ tablet   Commonly known as: K-DUR   Take 1 tablet (10 mEq total) by mouth 2 (two) times daily.      Vitamin D 2000 UNITS tablet   Take 2,000 Units by mouth daily.      warfarin 1 MG tablet   Commonly known as: COUMADIN   Take  2-3 mg by mouth daily. Takes 3mg  (3tabs) daily except Tuesday and Thursday  Takes 2mg  (2tabs) on Tuesday and Thursday             Disposition and Follow-up:  Follow up with primary MD.  Consults:  None.   Significant Diagnostic Studies:  Dg Chest Portable 1 View  04/09/2012  *RADIOLOGY REPORT*  Clinical Data: Respiratory distress, shortness of breath.  PORTABLE CHEST - 1 VIEW  Comparison: 09/23/2008  Findings: Degraded by rotation.  Cardiomegaly. Mitral valve prosthesis.  Central vascular congestion.  Coarse interstitial markings.  Bibasilar opacities.  Status post median sternotomy. Left chest wall battery pack with lead tips projecting over the right atrium right ventricle. No acute osseous finding.  IMPRESSION: Cardiomegaly with central vascular congestion.  Coarse interstitial markings may reflect COPD. Superimposed interstitial edema suspected.  Bibasilar opacities; atelectasis versus infiltrate.  Original Report Authenticated By: Waneta Martins, M.D.    Brief H and P: For complete details, refer to admission H  and P. However, in brief, this is 78 year-old female, with history of atrial fibrillation on Coumadin, CAD status post CABG, status post stenting, hypertension, chronic systolic heart failure last EF measured was 35% presenting to the ED via EMS, with a productive cough and shortness of breath since arrival for from [place] a few days prior. She was initially placed on CPAP, chest x-ray showed bibasal infiltrates and she had significant leukocytosis. Patient was admitted for further evaluation, investigation and management.  Physical Exam: On 04/11/12. General:   Alert, communicative, fully oriented, talking in complete sentences, not short of breath at rest.  HEENT:  No clinical pallor, no jaundice, no conjunctival injection or discharge. Oral thrush has improved.  NECK:  Supple, JVP not seen, no carotid bruits, no palpable lymphadenopathy, no palpable goiter. CHEST:   Clinically clear to auscultation, no wheezes, no crackles. HEART:  Sounds 1 and 2 heard, normal, regular, no murmurs. ABDOMEN:  Full, soft, no scars, non-tender, no palpable organomegaly, no palpable masses, normal bowel sounds. GENITALIA:  Not examined. LOWER EXTREMITIES:  No pitting edema, palpable peripheral pulses. MUSCULOSKELETAL SYSTEM:  Generalized osteoarthritic changes, otherwise, normal. CENTRAL NERVOUS SYSTEM:  No focal neurologic deficit on gross examination.   Hospital Course:  1. Pneumonia: This is community-acquired pneumonia. Patient presented as described above, with productive cough, leukocytosis of 18,800 and CXR findings, consistent with bilateral pneumonia. She was managed with Ceftriaxone/Azithromycin, with satisfactory clinical response. She remained apyrexial during her hospitalization, improved in well-being, and by 04/10/12, wcc had nomalized at 7,800. Clinically appreciably improved. No pyrexia, and wcc has already normalized. Repeat CXR of 04/11/12 showed no acute findings. She has been transitioned to oral Avelox, to complete a further 7 days of antibiotic therapy.  2. History of CHF: Patient has known history of ischemic CMP/systolic CHF with EF 35%. She was administered IV Lasix 20 mg initially, but clinically, did not appear acutely decompensated. ProBNP was elevated at 5069 on 04/09/12, and dropped to 1586 on 04/10/12. Oral Lasix was started from 04/11/12, and will be continued until reviewed by primary MD. 3. Atrial fibrillation/Tachy-Brady syndrome: Status post permanent pacemaker. Patient remained stable and rate-controlled on Digoxin and Lopressor. She continues on chronic Coumadin anticoagulation, which was managed by clinical pharmacologist during this hospitalization.  4. CAD status post CABG. Stable/asymptomatic.  5. Oral thrush: This was an incidental finding on physical examination. Patient is on a 7-day course of Diflucan, to be concluded on 04/16/12.  Time spent on  Discharge: 40 mins.   Signed: Shalamar Crays,CHRISTOPHER 04/11/2012, 12:22 PM

## 2012-04-11 NOTE — Progress Notes (Signed)
ANTICOAGULATION CONSULT NOTE - Initial Consult  Pharmacy Consult for coumadin Indication: atrial fibrillation  Allergies  Allergen Reactions  . Amiodarone     unknown  . Diphenhydramine Hcl Other (See Comments)    unknown   Vital Signs: Temp: 97.4 F (36.3 C) (07/05 0433) Temp src: Oral (07/05 0433) BP: 109/46 mmHg (07/05 0433) Pulse Rate: 72  (07/05 0433)  Labs:  Basename 04/11/12 0515 04/10/12 0521 04/09/12 0818 04/09/12 0310  HGB 12.5 12.2 -- --  HCT 38.9 38.4 42.2 --  PLT 182 184 202 --  APTT -- -- -- --  LABPROT 20.7* 21.7* -- 24.0*  INR 1.74* 1.85* -- 2.11*  HEPARINUNFRC -- -- -- --  CREATININE 0.74 0.77 0.76 --  CKTOTAL -- -- 74 --  CKMB -- -- 2.9 --  TROPONINI -- -- <0.30 --    Estimated Creatinine Clearance: 51.3 ml/min (by C-G formula based on Cr of 0.74).   Medical History: Past Medical History  Diagnosis Date  . Coronary artery disease     Prior stent to RCA in 1996 and 2008; s/p CAB in 2009 with MV repair and Maze  . Atrial fibrillation     in sinus on Tikosyn  . CHF (congestive heart failure)     EF 35% per echo in 2009  . Hypertension   . Anxiety   . Depression   . Hyperlipidemia   . Mitral insufficiency     s/p MV repair 2009  . IDA (iron deficiency anemia)   . Subarachnoid hemorrhage   . LBBB (left bundle branch block)   . Chronic anticoagulation   . High risk medication use     Tikosyn  . Tachycardia-bradycardia     s/p PPM  . Pacemaker     Medications:  Scheduled:     . azithromycin  250 mg Oral Daily  . cefTRIAXone (ROCEPHIN)  IV  1 g Intravenous Q24H  . cholecalciferol  2,000 Units Oral Daily  . digoxin  125 mcg Oral Daily  . dofetilide  250 mcg Oral BID  . ezetimibe  10 mg Oral Daily  . fluconazole  100 mg Oral Daily  . furosemide  20 mg Intravenous Once  . furosemide  20 mg Oral Daily  . guaiFENesin  600 mg Oral BID  . metoprolol tartrate  12.5 mg Oral BID  . pantoprazole  40 mg Oral Daily  . polyethylene glycol   17 g Oral Daily  . sodium chloride  3 mL Intravenous Q12H  . sodium chloride  3 mL Intravenous Q12H  . warfarin  2 mg Oral Custom  . warfarin  3 mg Oral Custom  . warfarin  3 mg Oral ONCE-1800  . Warfarin - Pharmacist Dosing Inpatient   Does not apply q1800  . DISCONTD: azithromycin  500 mg Intravenous Q24H  . DISCONTD: warfarin  2 mg Oral Custom    Assessment: 78 yo who was admitted for CAP. She has been on chronic coumadin for afib. INR subtherapeutic this AM.  It dropped despite giving a higher dose yesterday.    Goal of Therapy:  INR 2-3 Monitor platelets by anticoagulation protocol: Yes   Plan:  1. Coumadin 4mg  PO x1 2. Daily PT/INR  Ulyses Southward [facility] 04/11/2012,8:24 AM

## 2012-04-14 ENCOUNTER — Encounter (HOSPITAL_COMMUNITY): Payer: Medicare Other

## 2012-04-15 LAB — CULTURE, BLOOD (ROUTINE X 2): Culture: NO GROWTH

## 2012-04-16 ENCOUNTER — Encounter (HOSPITAL_COMMUNITY): Payer: Medicare Other

## 2012-04-18 ENCOUNTER — Encounter (HOSPITAL_COMMUNITY): Payer: Medicare Other

## 2012-04-21 ENCOUNTER — Encounter (HOSPITAL_COMMUNITY): Payer: Medicare Other

## 2012-04-23 ENCOUNTER — Encounter (HOSPITAL_COMMUNITY): Payer: Medicare Other

## 2012-04-25 ENCOUNTER — Encounter (HOSPITAL_COMMUNITY): Payer: Medicare Other

## 2012-04-28 ENCOUNTER — Ambulatory Visit (INDEPENDENT_AMBULATORY_CARE_PROVIDER_SITE_OTHER): Payer: Medicare Other

## 2012-04-28 ENCOUNTER — Encounter (HOSPITAL_COMMUNITY): Payer: Medicare Other

## 2012-04-28 DIAGNOSIS — I4891 Unspecified atrial fibrillation: Secondary | ICD-10-CM

## 2012-04-28 DIAGNOSIS — Z7901 Long term (current) use of anticoagulants: Secondary | ICD-10-CM

## 2012-04-28 LAB — POCT INR: INR: 6.1

## 2012-04-28 LAB — PROTIME-INR: Prothrombin Time: 59.5 s (ref 10.2–12.4)

## 2012-04-30 ENCOUNTER — Encounter (HOSPITAL_COMMUNITY): Payer: Medicare Other

## 2012-05-02 ENCOUNTER — Encounter (HOSPITAL_COMMUNITY): Payer: Medicare Other

## 2012-05-05 ENCOUNTER — Ambulatory Visit (INDEPENDENT_AMBULATORY_CARE_PROVIDER_SITE_OTHER): Payer: Medicare Other

## 2012-05-05 ENCOUNTER — Other Ambulatory Visit (HOSPITAL_COMMUNITY): Payer: Self-pay | Admitting: Family Medicine

## 2012-05-05 ENCOUNTER — Encounter (HOSPITAL_COMMUNITY): Payer: Medicare Other

## 2012-05-05 DIAGNOSIS — Z7901 Long term (current) use of anticoagulants: Secondary | ICD-10-CM

## 2012-05-05 DIAGNOSIS — I4891 Unspecified atrial fibrillation: Secondary | ICD-10-CM

## 2012-05-05 DIAGNOSIS — IMO0002 Reserved for concepts with insufficient information to code with codable children: Secondary | ICD-10-CM

## 2012-05-06 ENCOUNTER — Encounter: Payer: Self-pay | Admitting: Cardiology

## 2012-05-06 ENCOUNTER — Other Ambulatory Visit: Payer: Medicare Other

## 2012-05-07 ENCOUNTER — Encounter (HOSPITAL_COMMUNITY): Payer: Medicare Other

## 2012-05-07 ENCOUNTER — Other Ambulatory Visit (INDEPENDENT_AMBULATORY_CARE_PROVIDER_SITE_OTHER): Payer: Medicare Other

## 2012-05-07 ENCOUNTER — Ambulatory Visit (HOSPITAL_COMMUNITY): Payer: Medicare Other | Attending: Cardiovascular Disease

## 2012-05-07 DIAGNOSIS — I059 Rheumatic mitral valve disease, unspecified: Secondary | ICD-10-CM

## 2012-05-07 DIAGNOSIS — I4891 Unspecified atrial fibrillation: Secondary | ICD-10-CM

## 2012-05-07 DIAGNOSIS — I5022 Chronic systolic (congestive) heart failure: Secondary | ICD-10-CM | POA: Insufficient documentation

## 2012-05-07 DIAGNOSIS — IMO0002 Reserved for concepts with insufficient information to code with codable children: Secondary | ICD-10-CM

## 2012-05-07 DIAGNOSIS — I447 Left bundle-branch block, unspecified: Secondary | ICD-10-CM

## 2012-05-07 DIAGNOSIS — E7800 Pure hypercholesterolemia, unspecified: Secondary | ICD-10-CM | POA: Insufficient documentation

## 2012-05-07 DIAGNOSIS — E785 Hyperlipidemia, unspecified: Secondary | ICD-10-CM

## 2012-05-07 DIAGNOSIS — I079 Rheumatic tricuspid valve disease, unspecified: Secondary | ICD-10-CM | POA: Insufficient documentation

## 2012-05-07 LAB — HEPATIC FUNCTION PANEL
Albumin: 3.7 g/dL (ref 3.5–5.2)
Total Protein: 7.1 g/dL (ref 6.0–8.3)

## 2012-05-07 LAB — BASIC METABOLIC PANEL
BUN: 12 mg/dL (ref 6–23)
CO2: 27 mEq/L (ref 19–32)
Calcium: 9.2 mg/dL (ref 8.4–10.5)
Creatinine, Ser: 0.7 mg/dL (ref 0.4–1.2)
GFR: 87 mL/min (ref >60.00)
Glucose, Bld: 84 mg/dL (ref 70–99)

## 2012-05-07 LAB — LIPID PANEL
Cholesterol: 204 mg/dL — ABNORMAL HIGH (ref 0–200)
HDL: 40.5 mg/dL (ref >39.00)
Triglycerides: 196 mg/dL — ABNORMAL HIGH (ref 0.0–149.0)
VLDL: 39.2 mg/dL (ref 0.0–40.0)

## 2012-05-07 NOTE — Progress Notes (Signed)
Echocardiogram performed.  

## 2012-05-08 ENCOUNTER — Encounter (HOSPITAL_COMMUNITY): Payer: Self-pay | Admitting: Cardiology

## 2012-05-09 ENCOUNTER — Telehealth: Payer: Self-pay

## 2012-05-09 ENCOUNTER — Encounter (HOSPITAL_COMMUNITY): Payer: Self-pay

## 2012-05-09 DIAGNOSIS — I509 Heart failure, unspecified: Secondary | ICD-10-CM | POA: Insufficient documentation

## 2012-05-09 DIAGNOSIS — I472 Ventricular tachycardia, unspecified: Secondary | ICD-10-CM | POA: Insufficient documentation

## 2012-05-09 DIAGNOSIS — Z5189 Encounter for other specified aftercare: Secondary | ICD-10-CM | POA: Insufficient documentation

## 2012-05-09 DIAGNOSIS — R092 Respiratory arrest: Secondary | ICD-10-CM | POA: Insufficient documentation

## 2012-05-09 DIAGNOSIS — E785 Hyperlipidemia, unspecified: Secondary | ICD-10-CM | POA: Insufficient documentation

## 2012-05-09 DIAGNOSIS — I2589 Other forms of chronic ischemic heart disease: Secondary | ICD-10-CM | POA: Insufficient documentation

## 2012-05-09 DIAGNOSIS — I501 Left ventricular failure: Secondary | ICD-10-CM | POA: Insufficient documentation

## 2012-05-09 DIAGNOSIS — I059 Rheumatic mitral valve disease, unspecified: Secondary | ICD-10-CM | POA: Insufficient documentation

## 2012-05-09 DIAGNOSIS — F329 Major depressive disorder, single episode, unspecified: Secondary | ICD-10-CM | POA: Insufficient documentation

## 2012-05-09 DIAGNOSIS — Z951 Presence of aortocoronary bypass graft: Secondary | ICD-10-CM | POA: Insufficient documentation

## 2012-05-09 DIAGNOSIS — Z950 Presence of cardiac pacemaker: Secondary | ICD-10-CM | POA: Insufficient documentation

## 2012-05-09 DIAGNOSIS — I252 Old myocardial infarction: Secondary | ICD-10-CM | POA: Insufficient documentation

## 2012-05-09 DIAGNOSIS — D890 Polyclonal hypergammaglobulinemia: Secondary | ICD-10-CM | POA: Insufficient documentation

## 2012-05-09 DIAGNOSIS — Z7982 Long term (current) use of aspirin: Secondary | ICD-10-CM | POA: Insufficient documentation

## 2012-05-09 DIAGNOSIS — D509 Iron deficiency anemia, unspecified: Secondary | ICD-10-CM | POA: Insufficient documentation

## 2012-05-09 DIAGNOSIS — F3289 Other specified depressive episodes: Secondary | ICD-10-CM | POA: Insufficient documentation

## 2012-05-09 DIAGNOSIS — Z9889 Other specified postprocedural states: Secondary | ICD-10-CM | POA: Insufficient documentation

## 2012-05-09 DIAGNOSIS — Z8674 Personal history of sudden cardiac arrest: Secondary | ICD-10-CM | POA: Insufficient documentation

## 2012-05-09 DIAGNOSIS — I959 Hypotension, unspecified: Secondary | ICD-10-CM | POA: Insufficient documentation

## 2012-05-09 DIAGNOSIS — I2510 Atherosclerotic heart disease of native coronary artery without angina pectoris: Secondary | ICD-10-CM | POA: Insufficient documentation

## 2012-05-09 DIAGNOSIS — I447 Left bundle-branch block, unspecified: Secondary | ICD-10-CM | POA: Insufficient documentation

## 2012-05-09 DIAGNOSIS — Z7901 Long term (current) use of anticoagulants: Secondary | ICD-10-CM | POA: Insufficient documentation

## 2012-05-09 DIAGNOSIS — I2789 Other specified pulmonary heart diseases: Secondary | ICD-10-CM | POA: Insufficient documentation

## 2012-05-09 DIAGNOSIS — I4891 Unspecified atrial fibrillation: Secondary | ICD-10-CM | POA: Insufficient documentation

## 2012-05-09 DIAGNOSIS — Z9861 Coronary angioplasty status: Secondary | ICD-10-CM | POA: Insufficient documentation

## 2012-05-09 DIAGNOSIS — I4729 Other ventricular tachycardia: Secondary | ICD-10-CM | POA: Insufficient documentation

## 2012-05-09 NOTE — Telephone Encounter (Signed)
Patient called echo results given. 

## 2012-05-12 ENCOUNTER — Encounter (HOSPITAL_COMMUNITY)
Admission: RE | Admit: 2012-05-12 | Discharge: 2012-05-12 | Disposition: A | Discharge status: Home / Self Care | Payer: Self-pay | Source: Ambulatory Visit | Attending: Cardiology | Admitting: Cardiology

## 2012-05-14 ENCOUNTER — Encounter (HOSPITAL_COMMUNITY)
Admission: RE | Admit: 2012-05-14 | Discharge: 2012-05-14 | Disposition: A | Discharge status: Home / Self Care | Payer: Self-pay | Source: Ambulatory Visit | Attending: Cardiology | Admitting: Cardiology

## 2012-05-16 ENCOUNTER — Encounter (HOSPITAL_COMMUNITY): Payer: Self-pay

## 2012-05-19 ENCOUNTER — Encounter (HOSPITAL_COMMUNITY)
Admission: RE | Admit: 2012-05-19 | Discharge: 2012-05-19 | Disposition: A | Discharge status: Home / Self Care | Payer: Self-pay | Source: Ambulatory Visit | Attending: Cardiology | Admitting: Cardiology

## 2012-05-21 ENCOUNTER — Encounter (HOSPITAL_COMMUNITY)
Admission: RE | Admit: 2012-05-21 | Discharge: 2012-05-21 | Disposition: A | Discharge status: Home / Self Care | Payer: Self-pay | Source: Ambulatory Visit | Attending: Cardiology | Admitting: Cardiology

## 2012-05-23 ENCOUNTER — Encounter (HOSPITAL_COMMUNITY): Payer: Self-pay

## 2012-05-26 ENCOUNTER — Ambulatory Visit (INDEPENDENT_AMBULATORY_CARE_PROVIDER_SITE_OTHER): Payer: Medicare Other | Admitting: *Deleted

## 2012-05-26 ENCOUNTER — Encounter (HOSPITAL_COMMUNITY): Payer: Self-pay

## 2012-05-26 DIAGNOSIS — I4891 Unspecified atrial fibrillation: Secondary | ICD-10-CM

## 2012-05-26 DIAGNOSIS — Z7901 Long term (current) use of anticoagulants: Secondary | ICD-10-CM

## 2012-05-26 LAB — POCT INR: INR: 2.7

## 2012-05-28 ENCOUNTER — Encounter (HOSPITAL_COMMUNITY)
Admission: RE | Admit: 2012-05-28 | Discharge: 2012-05-28 | Disposition: A | Discharge status: Home / Self Care | Payer: Self-pay | Source: Ambulatory Visit | Attending: Cardiology | Admitting: Cardiology

## 2012-05-30 ENCOUNTER — Encounter (HOSPITAL_COMMUNITY): Payer: Self-pay

## 2012-05-30 DIAGNOSIS — L309 Dermatitis, unspecified: Secondary | ICD-10-CM | POA: Insufficient documentation

## 2012-06-02 ENCOUNTER — Encounter (HOSPITAL_COMMUNITY): Payer: Self-pay

## 2012-06-04 ENCOUNTER — Encounter (HOSPITAL_COMMUNITY)
Admission: RE | Admit: 2012-06-04 | Discharge: 2012-06-04 | Disposition: A | Discharge status: Home / Self Care | Payer: Self-pay | Source: Ambulatory Visit | Attending: Cardiology | Admitting: Cardiology

## 2012-06-06 ENCOUNTER — Encounter (HOSPITAL_COMMUNITY): Payer: Self-pay

## 2012-06-09 ENCOUNTER — Encounter (HOSPITAL_COMMUNITY): Payer: Self-pay

## 2012-06-11 ENCOUNTER — Encounter: Payer: Self-pay | Admitting: Cardiology

## 2012-06-11 ENCOUNTER — Ambulatory Visit (INDEPENDENT_AMBULATORY_CARE_PROVIDER_SITE_OTHER): Payer: Medicare Other | Admitting: Cardiology

## 2012-06-11 ENCOUNTER — Encounter: Payer: Self-pay | Admitting: Internal Medicine

## 2012-06-11 ENCOUNTER — Encounter (HOSPITAL_COMMUNITY): Payer: Self-pay

## 2012-06-11 VITALS — BP 110/60 | HR 64 | Ht 65.0 in | Wt 140.4 lb

## 2012-06-11 DIAGNOSIS — Z950 Presence of cardiac pacemaker: Secondary | ICD-10-CM | POA: Insufficient documentation

## 2012-06-11 DIAGNOSIS — I2789 Other specified pulmonary heart diseases: Secondary | ICD-10-CM | POA: Insufficient documentation

## 2012-06-11 DIAGNOSIS — E785 Hyperlipidemia, unspecified: Secondary | ICD-10-CM | POA: Insufficient documentation

## 2012-06-11 DIAGNOSIS — I4891 Unspecified atrial fibrillation: Secondary | ICD-10-CM

## 2012-06-11 DIAGNOSIS — I2589 Other forms of chronic ischemic heart disease: Secondary | ICD-10-CM | POA: Insufficient documentation

## 2012-06-11 DIAGNOSIS — Z7901 Long term (current) use of anticoagulants: Secondary | ICD-10-CM

## 2012-06-11 DIAGNOSIS — I2510 Atherosclerotic heart disease of native coronary artery without angina pectoris: Secondary | ICD-10-CM | POA: Insufficient documentation

## 2012-06-11 DIAGNOSIS — Z7982 Long term (current) use of aspirin: Secondary | ICD-10-CM | POA: Insufficient documentation

## 2012-06-11 DIAGNOSIS — D509 Iron deficiency anemia, unspecified: Secondary | ICD-10-CM | POA: Insufficient documentation

## 2012-06-11 DIAGNOSIS — Z954 Presence of other heart-valve replacement: Secondary | ICD-10-CM

## 2012-06-11 DIAGNOSIS — I472 Ventricular tachycardia, unspecified: Secondary | ICD-10-CM | POA: Insufficient documentation

## 2012-06-11 DIAGNOSIS — I4729 Other ventricular tachycardia: Secondary | ICD-10-CM | POA: Insufficient documentation

## 2012-06-11 DIAGNOSIS — Z9861 Coronary angioplasty status: Secondary | ICD-10-CM | POA: Insufficient documentation

## 2012-06-11 DIAGNOSIS — D890 Polyclonal hypergammaglobulinemia: Secondary | ICD-10-CM | POA: Insufficient documentation

## 2012-06-11 DIAGNOSIS — I059 Rheumatic mitral valve disease, unspecified: Secondary | ICD-10-CM | POA: Insufficient documentation

## 2012-06-11 DIAGNOSIS — I501 Left ventricular failure: Secondary | ICD-10-CM | POA: Insufficient documentation

## 2012-06-11 DIAGNOSIS — I509 Heart failure, unspecified: Secondary | ICD-10-CM

## 2012-06-11 DIAGNOSIS — I252 Old myocardial infarction: Secondary | ICD-10-CM | POA: Insufficient documentation

## 2012-06-11 DIAGNOSIS — I498 Other specified cardiac arrhythmias: Secondary | ICD-10-CM

## 2012-06-11 DIAGNOSIS — Z8674 Personal history of sudden cardiac arrest: Secondary | ICD-10-CM | POA: Insufficient documentation

## 2012-06-11 DIAGNOSIS — Z952 Presence of prosthetic heart valve: Secondary | ICD-10-CM

## 2012-06-11 DIAGNOSIS — Z5189 Encounter for other specified aftercare: Secondary | ICD-10-CM | POA: Insufficient documentation

## 2012-06-11 DIAGNOSIS — Z951 Presence of aortocoronary bypass graft: Secondary | ICD-10-CM | POA: Insufficient documentation

## 2012-06-11 DIAGNOSIS — Z9889 Other specified postprocedural states: Secondary | ICD-10-CM | POA: Insufficient documentation

## 2012-06-11 DIAGNOSIS — F329 Major depressive disorder, single episode, unspecified: Secondary | ICD-10-CM | POA: Insufficient documentation

## 2012-06-11 DIAGNOSIS — R092 Respiratory arrest: Secondary | ICD-10-CM | POA: Insufficient documentation

## 2012-06-11 DIAGNOSIS — F3289 Other specified depressive episodes: Secondary | ICD-10-CM | POA: Insufficient documentation

## 2012-06-11 DIAGNOSIS — I959 Hypotension, unspecified: Secondary | ICD-10-CM | POA: Insufficient documentation

## 2012-06-11 DIAGNOSIS — I447 Left bundle-branch block, unspecified: Secondary | ICD-10-CM | POA: Insufficient documentation

## 2012-06-11 LAB — PACEMAKER DEVICE OBSERVATION
AL AMPLITUDE: 1 mv
ATRIAL PACING PM: 8.55
BAMS-0001: 170 {beats}/min
RV LEAD THRESHOLD: 1 V
VENTRICULAR PACING PM: 0.13

## 2012-06-11 NOTE — Patient Instructions (Signed)
Continue your current medication.  I will see you again in 6 months with lab work.     

## 2012-06-11 NOTE — Progress Notes (Signed)
PPM check only. See PaceArt report.

## 2012-06-11 NOTE — Progress Notes (Signed)
[name] Date of Birth: [birth year] Medical Record #[id number]  History of Present Illness: [name] is seen today for a followup visit. In general she is doing well. She states that she doesn't want to live much longer. All her children live out of town and she states they don't want much to do with her. She does continue to participate in cardiac rehabilitation. She denies any significant chest pain, shortness of breath, or palpitations. She has had no increase in edema or orthopnea. She is tolerating her medications well. Her Coumadin has been therapeutic with recent INR of 2.7.  Current Outpatient Prescriptions on File Prior to Visit  Medication Sig Dispense Refill  . Cholecalciferol (VITAMIN D) 2000 UNITS tablet Take 2,000 Units by mouth daily.        . digoxin (LANOXIN) 0.125 MG tablet Take 1 tablet (125 mcg total) by mouth daily.  90 tablet  3  . dofetilide (TIKOSYN) 250 MCG capsule Take 1 capsule (250 mcg total) by mouth 2 (two) times daily.  60 capsule  1  . ezetimibe (ZETIA) 10 MG tablet Take 1 tablet (10 mg total) by mouth daily.  90 tablet  3  . furosemide (LASIX) 20 MG tablet Take 1 tablet (20 mg total) by mouth daily.  30 tablet  0  . guaiFENesin (MUCINEX) 600 MG 12 hr tablet Take 1 tablet (600 mg total) by mouth 2 (two) times daily.  14 tablet  0  . LORazepam (ATIVAN) 0.5 MG tablet Take 0.5 mg by mouth every 8 (eight) hours as needed.      . metoprolol tartrate (LOPRESSOR) 25 MG tablet Take 0.5 tablets (12.5 mg total) by mouth 2 (two) times daily.  90 tablet  3  . pantoprazole (PROTONIX) 40 MG tablet Take 1 tablet (40 mg total) by mouth daily.  90 tablet  3  . potassium chloride (K-DUR) 10 MEQ tablet Take 1 tablet (10 mEq total) by mouth 2 (two) times daily.  30 tablet  0  . warfarin (COUMADIN) 1 MG tablet Take 2-3 mg by mouth daily. Takes 3mg  (3tabs) daily except Tuesday and Thursday Takes 2mg  (2tabs) on Tuesday and Thursday        Allergies  Allergen Reactions    . Amiodarone     unknown  . Diphenhydramine Hcl Other (See Comments)    unknown    Past Medical History  Diagnosis Date  . Coronary artery disease     Prior stent to RCA in 1996 and 2008; s/p CAB in 2009 with MV repair and Maze  . Atrial fibrillation     in sinus on Tikosyn  . CHF (congestive heart failure)     EF 35% per echo in 2009  . Hypertension   . Anxiety   . Depression   . Hyperlipidemia   . Mitral insufficiency     s/p MV repair 2009  . IDA (iron deficiency anemia)   . Subarachnoid hemorrhage   . LBBB (left bundle branch block)   . Chronic anticoagulation   . High risk medication use     Tikosyn  . Tachycardia-bradycardia     s/p PPM  . Pacemaker     Past Surgical History  Procedure Date  . Cardiac catheterization 03/19/2007    EF 45%. THERE IS 2+ MITRAL INSUFFICIENCY. THERE IS MODERATE INFERIOR WALL HYPOKINESIA WITH OVERALL MILD TO MODERATE LV DYSFUNCTION  . Coronary stent placement 1996, AND 03/2007    RIGHT CORONARY  . Removal of colon polyp  AND A LARGE CECAL POLYP  . Coronary artery bypass graft 04/2008    INCLUDING A MITRAL VALVE REPAIR AND A LEFT SIDED MAZE PROCEDURE  . Coronary artery bypass graft     X2 with LIMA to LAD and SVG to PD and WITH A LEFT SIDED MAZE PROCEDURE & MITRAL VALVE REPAIR. THIS INCLUDED AN LIMA GRAFT TO THE LAD, AND A SAPHENOUS VEIN GRAFT TO THE PDA.  Marland Kitchen Transthoracic echocardiogram 06/21/2008    EF 35%  . Cardiac pacemaker placement   . Mitral valve repair     History  Smoking status  . Former Smoker  . Quit date: 05/06/1996  Smokeless tobacco  . Never Used    History  Alcohol Use No    Family History  Problem Relation Age of Onset  . Heart attack Mother   . Cancer Father     Review of Systems: The review of systems is per the HPI. No cardiac complaints.  All other systems were reviewed and are negative.  Physical Exam: BP 110/60  Pulse 64  Ht 5\' 5"  (1.651 m)  Wt 63.685 kg (140 lb 6.4 oz)  BMI 23.36  kg/m2 Patient is  depressed but in no acute distress. Mood and affect is quite flat.  Skin is warm and dry. Color is normal.  HEENT is unremarkable. Normocephalic/atraumatic. PERRL. Sclera are nonicteric. Neck is supple. No masses. No JVD. Lungs are clear. Cardiac exam shows a regular rate and rhythm. Soft systolic murmur noted. No gallop. Abdomen is soft. Extremities are without edema. Gait and ROM are intact. No gross neurologic deficits noted.  LABORATORY DATA: ECG today demonstrates normal sinus rhythm with a left bundle branch block. QTC is 466 ms.  Recent echocardiogram demonstrates moderate to severe left ventricular dysfunction with ejection fraction of 30-35%. There was inferior wall akinesis and diffuse hypokinesis. This is really unchanged from 2009. Her mitral valve prosthesis is functioning well. There is moderate left atrial enlargement.  Pacemaker interrogation today demonstrates no episodes of atrial fibrillation. Pacemaker function is normal.   Assessment / Plan: 1. History of mitral regurgitation status post repair in 2009. Followup echocardiogram demonstrates satisfactory result.  2. Atrial fibrillation. Well controlled on Tikosyn without evidence of recurrence. QTC is normal.  3. Congestive heart failure with chronic systolic dysfunction. Ejection fraction is stable compared to 2009. She is asymptomatic. Continue metoprolol.  4. Coronary disease status post CABG in 2009.  5. Tachybradycardia syndrome. Status post permanent pacemaker placement with stable pacemaker function.  6. Hyperlipidemia on Zetia.

## 2012-06-13 ENCOUNTER — Encounter (HOSPITAL_COMMUNITY)
Admission: RE | Admit: 2012-06-13 | Discharge: 2012-06-13 | Disposition: A | Discharge status: Home / Self Care | Payer: Self-pay | Source: Ambulatory Visit | Attending: Cardiology | Admitting: Cardiology

## 2012-06-16 ENCOUNTER — Encounter (HOSPITAL_COMMUNITY)
Admission: RE | Admit: 2012-06-16 | Discharge: 2012-06-16 | Disposition: A | Discharge status: Home / Self Care | Payer: Self-pay | Source: Ambulatory Visit | Attending: Cardiology | Admitting: Cardiology

## 2012-06-18 ENCOUNTER — Encounter (HOSPITAL_COMMUNITY)
Admission: RE | Admit: 2012-06-18 | Discharge: 2012-06-18 | Disposition: A | Discharge status: Home / Self Care | Payer: Self-pay | Source: Ambulatory Visit | Attending: Cardiology | Admitting: Cardiology

## 2012-06-20 ENCOUNTER — Encounter (HOSPITAL_COMMUNITY): Payer: Self-pay

## 2012-06-23 ENCOUNTER — Ambulatory Visit (INDEPENDENT_AMBULATORY_CARE_PROVIDER_SITE_OTHER): Payer: Medicare Other | Admitting: *Deleted

## 2012-06-23 ENCOUNTER — Encounter (HOSPITAL_COMMUNITY)
Admission: RE | Admit: 2012-06-23 | Discharge: 2012-06-23 | Disposition: A | Discharge status: Home / Self Care | Payer: Self-pay | Source: Ambulatory Visit | Attending: Cardiology | Admitting: Cardiology

## 2012-06-23 DIAGNOSIS — Z7901 Long term (current) use of anticoagulants: Secondary | ICD-10-CM

## 2012-06-23 DIAGNOSIS — I4891 Unspecified atrial fibrillation: Secondary | ICD-10-CM

## 2012-06-23 LAB — POCT INR: INR: 2.6

## 2012-06-25 ENCOUNTER — Encounter (HOSPITAL_COMMUNITY)
Admission: RE | Admit: 2012-06-25 | Discharge: 2012-06-25 | Disposition: A | Discharge status: Home / Self Care | Payer: Self-pay | Source: Ambulatory Visit | Attending: Cardiology | Admitting: Cardiology

## 2012-06-27 ENCOUNTER — Encounter (HOSPITAL_COMMUNITY)
Admission: RE | Admit: 2012-06-27 | Discharge: 2012-06-27 | Disposition: A | Discharge status: Home / Self Care | Payer: Self-pay | Source: Ambulatory Visit | Attending: Cardiology | Admitting: Cardiology

## 2012-06-30 ENCOUNTER — Encounter (HOSPITAL_COMMUNITY): Payer: Self-pay

## 2012-07-02 ENCOUNTER — Encounter (HOSPITAL_COMMUNITY)
Admission: RE | Admit: 2012-07-02 | Discharge: 2012-07-02 | Disposition: A | Discharge status: Home / Self Care | Payer: Self-pay | Source: Ambulatory Visit | Attending: Cardiology | Admitting: Cardiology

## 2012-07-03 ENCOUNTER — Other Ambulatory Visit: Payer: Self-pay

## 2012-07-03 MED ORDER — DOFETILIDE 250 MCG PO CAPS: 250.0000 ug | ORAL_CAPSULE | Freq: Two times a day (BID) | ORAL | Status: DC

## 2012-07-04 ENCOUNTER — Encounter (HOSPITAL_COMMUNITY): Payer: Self-pay

## 2012-07-07 ENCOUNTER — Encounter (HOSPITAL_COMMUNITY): Payer: Self-pay

## 2012-07-07 ENCOUNTER — Telehealth: Payer: Self-pay | Admitting: Cardiology

## 2012-07-07 NOTE — Telephone Encounter (Signed)
New problem:  tikosyn 250 mg.  [facility]

## 2012-07-07 NOTE — Telephone Encounter (Signed)
Follow up:    Patient called in needing a refill of her dofetilide (TIKOSYN) 250 MCG capsule.  Patient is out of medication.  Please fax a prescription [phone].

## 2012-07-08 ENCOUNTER — Other Ambulatory Visit: Payer: Self-pay

## 2012-07-08 MED ORDER — DOFETILIDE 250 MCG PO CAPS: 250.0000 ug | ORAL_CAPSULE | Freq: Two times a day (BID) | ORAL | Status: DC

## 2012-07-09 ENCOUNTER — Encounter (HOSPITAL_COMMUNITY): Payer: Self-pay

## 2012-07-09 DIAGNOSIS — I059 Rheumatic mitral valve disease, unspecified: Secondary | ICD-10-CM | POA: Insufficient documentation

## 2012-07-09 DIAGNOSIS — D890 Polyclonal hypergammaglobulinemia: Secondary | ICD-10-CM | POA: Insufficient documentation

## 2012-07-09 DIAGNOSIS — F329 Major depressive disorder, single episode, unspecified: Secondary | ICD-10-CM | POA: Insufficient documentation

## 2012-07-09 DIAGNOSIS — I501 Left ventricular failure: Secondary | ICD-10-CM | POA: Insufficient documentation

## 2012-07-09 DIAGNOSIS — D509 Iron deficiency anemia, unspecified: Secondary | ICD-10-CM | POA: Insufficient documentation

## 2012-07-09 DIAGNOSIS — Z8674 Personal history of sudden cardiac arrest: Secondary | ICD-10-CM | POA: Insufficient documentation

## 2012-07-09 DIAGNOSIS — I252 Old myocardial infarction: Secondary | ICD-10-CM | POA: Insufficient documentation

## 2012-07-09 DIAGNOSIS — Z7982 Long term (current) use of aspirin: Secondary | ICD-10-CM | POA: Insufficient documentation

## 2012-07-09 DIAGNOSIS — I447 Left bundle-branch block, unspecified: Secondary | ICD-10-CM | POA: Insufficient documentation

## 2012-07-09 DIAGNOSIS — Z7901 Long term (current) use of anticoagulants: Secondary | ICD-10-CM | POA: Insufficient documentation

## 2012-07-09 DIAGNOSIS — Z951 Presence of aortocoronary bypass graft: Secondary | ICD-10-CM | POA: Insufficient documentation

## 2012-07-09 DIAGNOSIS — Z950 Presence of cardiac pacemaker: Secondary | ICD-10-CM | POA: Insufficient documentation

## 2012-07-09 DIAGNOSIS — I2589 Other forms of chronic ischemic heart disease: Secondary | ICD-10-CM | POA: Insufficient documentation

## 2012-07-09 DIAGNOSIS — I509 Heart failure, unspecified: Secondary | ICD-10-CM | POA: Insufficient documentation

## 2012-07-09 DIAGNOSIS — Z9861 Coronary angioplasty status: Secondary | ICD-10-CM | POA: Insufficient documentation

## 2012-07-09 DIAGNOSIS — Z5189 Encounter for other specified aftercare: Secondary | ICD-10-CM | POA: Insufficient documentation

## 2012-07-09 DIAGNOSIS — I959 Hypotension, unspecified: Secondary | ICD-10-CM | POA: Insufficient documentation

## 2012-07-09 DIAGNOSIS — I4729 Other ventricular tachycardia: Secondary | ICD-10-CM | POA: Insufficient documentation

## 2012-07-09 DIAGNOSIS — E785 Hyperlipidemia, unspecified: Secondary | ICD-10-CM | POA: Insufficient documentation

## 2012-07-09 DIAGNOSIS — I4891 Unspecified atrial fibrillation: Secondary | ICD-10-CM | POA: Insufficient documentation

## 2012-07-09 DIAGNOSIS — I472 Ventricular tachycardia, unspecified: Secondary | ICD-10-CM | POA: Insufficient documentation

## 2012-07-09 DIAGNOSIS — Z9889 Other specified postprocedural states: Secondary | ICD-10-CM | POA: Insufficient documentation

## 2012-07-09 DIAGNOSIS — I2789 Other specified pulmonary heart diseases: Secondary | ICD-10-CM | POA: Insufficient documentation

## 2012-07-09 DIAGNOSIS — F3289 Other specified depressive episodes: Secondary | ICD-10-CM | POA: Insufficient documentation

## 2012-07-09 DIAGNOSIS — I2510 Atherosclerotic heart disease of native coronary artery without angina pectoris: Secondary | ICD-10-CM | POA: Insufficient documentation

## 2012-07-09 DIAGNOSIS — R092 Respiratory arrest: Secondary | ICD-10-CM | POA: Insufficient documentation

## 2012-07-11 ENCOUNTER — Encounter (HOSPITAL_COMMUNITY): Payer: Self-pay

## 2012-07-14 ENCOUNTER — Encounter (HOSPITAL_COMMUNITY): Payer: Self-pay

## 2012-07-16 ENCOUNTER — Encounter (HOSPITAL_COMMUNITY): Payer: Self-pay

## 2012-07-18 ENCOUNTER — Encounter (HOSPITAL_COMMUNITY): Payer: Self-pay

## 2012-07-21 ENCOUNTER — Encounter (HOSPITAL_COMMUNITY): Payer: Self-pay

## 2012-07-21 ENCOUNTER — Ambulatory Visit (INDEPENDENT_AMBULATORY_CARE_PROVIDER_SITE_OTHER): Payer: Medicare Other | Admitting: Pharmacist

## 2012-07-21 DIAGNOSIS — I4891 Unspecified atrial fibrillation: Secondary | ICD-10-CM

## 2012-07-21 DIAGNOSIS — Z7901 Long term (current) use of anticoagulants: Secondary | ICD-10-CM

## 2012-07-23 ENCOUNTER — Encounter (HOSPITAL_COMMUNITY): Payer: Self-pay

## 2012-07-25 ENCOUNTER — Encounter (HOSPITAL_COMMUNITY): Payer: Self-pay

## 2012-07-28 ENCOUNTER — Encounter (HOSPITAL_COMMUNITY): Payer: Self-pay

## 2012-07-30 ENCOUNTER — Encounter (HOSPITAL_COMMUNITY)
Admission: RE | Admit: 2012-07-30 | Discharge: 2012-07-30 | Disposition: A | Discharge status: Home / Self Care | Payer: Self-pay | Source: Ambulatory Visit | Attending: Cardiology | Admitting: Cardiology

## 2012-07-30 NOTE — Addendum Note (Signed)
Addended by: Marrion Coy L on: 07/30/2012 11:53 AM   Modules accepted: Orders

## 2012-08-01 ENCOUNTER — Encounter (HOSPITAL_COMMUNITY): Payer: Self-pay

## 2012-08-03 ENCOUNTER — Other Ambulatory Visit: Payer: Self-pay | Admitting: Cardiology

## 2012-08-04 ENCOUNTER — Encounter (HOSPITAL_COMMUNITY): Payer: Self-pay

## 2012-08-06 ENCOUNTER — Encounter (HOSPITAL_COMMUNITY)
Admission: RE | Admit: 2012-08-06 | Discharge: 2012-08-06 | Disposition: A | Discharge status: Home / Self Care | Payer: Self-pay | Source: Ambulatory Visit | Attending: Cardiology | Admitting: Cardiology

## 2012-08-08 ENCOUNTER — Encounter (HOSPITAL_COMMUNITY): Payer: Self-pay | Attending: Cardiology

## 2012-08-08 DIAGNOSIS — I2789 Other specified pulmonary heart diseases: Secondary | ICD-10-CM | POA: Insufficient documentation

## 2012-08-08 DIAGNOSIS — I447 Left bundle-branch block, unspecified: Secondary | ICD-10-CM | POA: Insufficient documentation

## 2012-08-08 DIAGNOSIS — I509 Heart failure, unspecified: Secondary | ICD-10-CM | POA: Insufficient documentation

## 2012-08-08 DIAGNOSIS — Z9861 Coronary angioplasty status: Secondary | ICD-10-CM | POA: Insufficient documentation

## 2012-08-08 DIAGNOSIS — I2510 Atherosclerotic heart disease of native coronary artery without angina pectoris: Secondary | ICD-10-CM | POA: Insufficient documentation

## 2012-08-08 DIAGNOSIS — Z7982 Long term (current) use of aspirin: Secondary | ICD-10-CM | POA: Insufficient documentation

## 2012-08-08 DIAGNOSIS — Z950 Presence of cardiac pacemaker: Secondary | ICD-10-CM | POA: Insufficient documentation

## 2012-08-08 DIAGNOSIS — I059 Rheumatic mitral valve disease, unspecified: Secondary | ICD-10-CM | POA: Insufficient documentation

## 2012-08-08 DIAGNOSIS — F3289 Other specified depressive episodes: Secondary | ICD-10-CM | POA: Insufficient documentation

## 2012-08-08 DIAGNOSIS — F329 Major depressive disorder, single episode, unspecified: Secondary | ICD-10-CM | POA: Insufficient documentation

## 2012-08-08 DIAGNOSIS — D890 Polyclonal hypergammaglobulinemia: Secondary | ICD-10-CM | POA: Insufficient documentation

## 2012-08-08 DIAGNOSIS — I2589 Other forms of chronic ischemic heart disease: Secondary | ICD-10-CM | POA: Insufficient documentation

## 2012-08-08 DIAGNOSIS — Z8674 Personal history of sudden cardiac arrest: Secondary | ICD-10-CM | POA: Insufficient documentation

## 2012-08-08 DIAGNOSIS — I959 Hypotension, unspecified: Secondary | ICD-10-CM | POA: Insufficient documentation

## 2012-08-08 DIAGNOSIS — Z5189 Encounter for other specified aftercare: Secondary | ICD-10-CM | POA: Insufficient documentation

## 2012-08-08 DIAGNOSIS — I472 Ventricular tachycardia, unspecified: Secondary | ICD-10-CM | POA: Insufficient documentation

## 2012-08-08 DIAGNOSIS — I501 Left ventricular failure: Secondary | ICD-10-CM | POA: Insufficient documentation

## 2012-08-08 DIAGNOSIS — I252 Old myocardial infarction: Secondary | ICD-10-CM | POA: Insufficient documentation

## 2012-08-08 DIAGNOSIS — I4891 Unspecified atrial fibrillation: Secondary | ICD-10-CM | POA: Insufficient documentation

## 2012-08-08 DIAGNOSIS — Z7901 Long term (current) use of anticoagulants: Secondary | ICD-10-CM | POA: Insufficient documentation

## 2012-08-08 DIAGNOSIS — I4729 Other ventricular tachycardia: Secondary | ICD-10-CM | POA: Insufficient documentation

## 2012-08-08 DIAGNOSIS — D509 Iron deficiency anemia, unspecified: Secondary | ICD-10-CM | POA: Insufficient documentation

## 2012-08-08 DIAGNOSIS — E785 Hyperlipidemia, unspecified: Secondary | ICD-10-CM | POA: Insufficient documentation

## 2012-08-08 DIAGNOSIS — R092 Respiratory arrest: Secondary | ICD-10-CM | POA: Insufficient documentation

## 2012-08-08 DIAGNOSIS — Z9889 Other specified postprocedural states: Secondary | ICD-10-CM | POA: Insufficient documentation

## 2012-08-08 DIAGNOSIS — Z951 Presence of aortocoronary bypass graft: Secondary | ICD-10-CM | POA: Insufficient documentation

## 2012-08-11 ENCOUNTER — Encounter (HOSPITAL_COMMUNITY): Payer: Self-pay

## 2012-08-13 ENCOUNTER — Encounter (HOSPITAL_COMMUNITY): Payer: Self-pay

## 2012-08-15 ENCOUNTER — Encounter (HOSPITAL_COMMUNITY): Payer: Self-pay

## 2012-08-18 ENCOUNTER — Encounter (HOSPITAL_COMMUNITY): Payer: Self-pay

## 2012-08-20 ENCOUNTER — Encounter (HOSPITAL_COMMUNITY): Payer: Self-pay

## 2012-08-21 ENCOUNTER — Other Ambulatory Visit: Payer: Self-pay | Admitting: *Deleted

## 2012-08-21 MED ORDER — WARFARIN SODIUM 1 MG PO TABS: 1.0000 mg | ORAL_TABLET | ORAL | Status: DC

## 2012-08-22 ENCOUNTER — Encounter (HOSPITAL_COMMUNITY): Payer: Self-pay

## 2012-08-25 ENCOUNTER — Encounter (HOSPITAL_COMMUNITY): Payer: Self-pay

## 2012-08-27 ENCOUNTER — Encounter (HOSPITAL_COMMUNITY): Payer: Self-pay

## 2012-08-29 ENCOUNTER — Encounter (HOSPITAL_COMMUNITY): Payer: Self-pay

## 2012-09-01 ENCOUNTER — Other Ambulatory Visit: Payer: Self-pay

## 2012-09-01 ENCOUNTER — Ambulatory Visit (INDEPENDENT_AMBULATORY_CARE_PROVIDER_SITE_OTHER): Payer: Medicare Other | Admitting: *Deleted

## 2012-09-01 ENCOUNTER — Encounter (HOSPITAL_COMMUNITY): Payer: Self-pay

## 2012-09-01 DIAGNOSIS — Z7901 Long term (current) use of anticoagulants: Secondary | ICD-10-CM

## 2012-09-01 DIAGNOSIS — I4891 Unspecified atrial fibrillation: Secondary | ICD-10-CM

## 2012-09-01 LAB — POCT INR: INR: 2.5

## 2012-09-01 MED ORDER — DOFETILIDE 250 MCG PO CAPS: 250.0000 ug | ORAL_CAPSULE | Freq: Two times a day (BID) | ORAL | Status: DC

## 2012-09-02 ENCOUNTER — Telehealth (HOSPITAL_COMMUNITY): Payer: Self-pay | Admitting: *Deleted

## 2012-09-03 ENCOUNTER — Encounter (HOSPITAL_COMMUNITY): Payer: Self-pay

## 2012-09-05 ENCOUNTER — Encounter (HOSPITAL_COMMUNITY): Payer: Self-pay

## 2012-09-08 ENCOUNTER — Encounter (HOSPITAL_COMMUNITY): Payer: Medicare Other | Attending: Cardiology

## 2012-09-08 ENCOUNTER — Encounter (HOSPITAL_COMMUNITY): Payer: Medicare Other

## 2012-09-08 DIAGNOSIS — I472 Ventricular tachycardia, unspecified: Secondary | ICD-10-CM | POA: Insufficient documentation

## 2012-09-08 DIAGNOSIS — D890 Polyclonal hypergammaglobulinemia: Secondary | ICD-10-CM | POA: Insufficient documentation

## 2012-09-08 DIAGNOSIS — I252 Old myocardial infarction: Secondary | ICD-10-CM | POA: Insufficient documentation

## 2012-09-08 DIAGNOSIS — I447 Left bundle-branch block, unspecified: Secondary | ICD-10-CM | POA: Insufficient documentation

## 2012-09-08 DIAGNOSIS — Z8674 Personal history of sudden cardiac arrest: Secondary | ICD-10-CM | POA: Insufficient documentation

## 2012-09-08 DIAGNOSIS — I059 Rheumatic mitral valve disease, unspecified: Secondary | ICD-10-CM | POA: Insufficient documentation

## 2012-09-08 DIAGNOSIS — Z951 Presence of aortocoronary bypass graft: Secondary | ICD-10-CM | POA: Insufficient documentation

## 2012-09-08 DIAGNOSIS — F329 Major depressive disorder, single episode, unspecified: Secondary | ICD-10-CM | POA: Insufficient documentation

## 2012-09-08 DIAGNOSIS — R092 Respiratory arrest: Secondary | ICD-10-CM | POA: Insufficient documentation

## 2012-09-08 DIAGNOSIS — I959 Hypotension, unspecified: Secondary | ICD-10-CM | POA: Insufficient documentation

## 2012-09-08 DIAGNOSIS — Z950 Presence of cardiac pacemaker: Secondary | ICD-10-CM | POA: Insufficient documentation

## 2012-09-08 DIAGNOSIS — D509 Iron deficiency anemia, unspecified: Secondary | ICD-10-CM | POA: Insufficient documentation

## 2012-09-08 DIAGNOSIS — Z7982 Long term (current) use of aspirin: Secondary | ICD-10-CM | POA: Insufficient documentation

## 2012-09-08 DIAGNOSIS — E785 Hyperlipidemia, unspecified: Secondary | ICD-10-CM | POA: Insufficient documentation

## 2012-09-08 DIAGNOSIS — I4729 Other ventricular tachycardia: Secondary | ICD-10-CM | POA: Insufficient documentation

## 2012-09-08 DIAGNOSIS — F3289 Other specified depressive episodes: Secondary | ICD-10-CM | POA: Insufficient documentation

## 2012-09-08 DIAGNOSIS — I4891 Unspecified atrial fibrillation: Secondary | ICD-10-CM | POA: Insufficient documentation

## 2012-09-08 DIAGNOSIS — Z5189 Encounter for other specified aftercare: Secondary | ICD-10-CM | POA: Insufficient documentation

## 2012-09-08 DIAGNOSIS — I509 Heart failure, unspecified: Secondary | ICD-10-CM | POA: Insufficient documentation

## 2012-09-08 DIAGNOSIS — I2510 Atherosclerotic heart disease of native coronary artery without angina pectoris: Secondary | ICD-10-CM | POA: Insufficient documentation

## 2012-09-08 DIAGNOSIS — Z7901 Long term (current) use of anticoagulants: Secondary | ICD-10-CM | POA: Insufficient documentation

## 2012-09-08 DIAGNOSIS — Z9861 Coronary angioplasty status: Secondary | ICD-10-CM | POA: Insufficient documentation

## 2012-09-08 DIAGNOSIS — I2589 Other forms of chronic ischemic heart disease: Secondary | ICD-10-CM | POA: Insufficient documentation

## 2012-09-08 DIAGNOSIS — I501 Left ventricular failure: Secondary | ICD-10-CM | POA: Insufficient documentation

## 2012-09-08 DIAGNOSIS — I2789 Other specified pulmonary heart diseases: Secondary | ICD-10-CM | POA: Insufficient documentation

## 2012-09-08 DIAGNOSIS — Z9889 Other specified postprocedural states: Secondary | ICD-10-CM | POA: Insufficient documentation

## 2012-09-10 ENCOUNTER — Encounter (HOSPITAL_COMMUNITY): Payer: Medicare Other

## 2012-09-12 ENCOUNTER — Encounter (HOSPITAL_COMMUNITY): Payer: Medicare Other

## 2012-09-15 ENCOUNTER — Encounter (HOSPITAL_COMMUNITY): Payer: Medicare Other

## 2012-09-17 ENCOUNTER — Encounter (HOSPITAL_COMMUNITY): Payer: Medicare Other

## 2012-09-19 ENCOUNTER — Encounter (HOSPITAL_COMMUNITY): Payer: Medicare Other

## 2012-09-22 ENCOUNTER — Encounter (HOSPITAL_COMMUNITY): Payer: Medicare Other

## 2012-09-24 ENCOUNTER — Encounter (HOSPITAL_COMMUNITY): Payer: Medicare Other

## 2012-09-26 ENCOUNTER — Encounter (HOSPITAL_COMMUNITY): Payer: Medicare Other

## 2012-09-29 ENCOUNTER — Encounter (HOSPITAL_COMMUNITY): Payer: Medicare Other

## 2012-10-01 ENCOUNTER — Encounter (HOSPITAL_COMMUNITY): Payer: Medicare Other

## 2012-10-03 ENCOUNTER — Telehealth (HOSPITAL_COMMUNITY): Payer: Self-pay | Admitting: *Deleted

## 2012-10-03 ENCOUNTER — Encounter (HOSPITAL_COMMUNITY): Admission: RE | Admit: 2012-10-03 | Payer: Medicare Other | Source: Ambulatory Visit

## 2012-10-03 ENCOUNTER — Encounter (HOSPITAL_COMMUNITY): Payer: Medicare Other

## 2012-10-06 ENCOUNTER — Encounter (HOSPITAL_COMMUNITY): Payer: Medicare Other

## 2012-10-08 ENCOUNTER — Encounter (HOSPITAL_COMMUNITY): Payer: Medicare Other

## 2012-10-10 ENCOUNTER — Encounter (HOSPITAL_COMMUNITY): Payer: Medicare Other | Attending: Cardiology

## 2012-10-10 ENCOUNTER — Encounter (HOSPITAL_COMMUNITY): Payer: Medicare Other

## 2012-10-10 DIAGNOSIS — Z7901 Long term (current) use of anticoagulants: Secondary | ICD-10-CM | POA: Insufficient documentation

## 2012-10-10 DIAGNOSIS — Z9889 Other specified postprocedural states: Secondary | ICD-10-CM | POA: Insufficient documentation

## 2012-10-10 DIAGNOSIS — D890 Polyclonal hypergammaglobulinemia: Secondary | ICD-10-CM | POA: Insufficient documentation

## 2012-10-10 DIAGNOSIS — F3289 Other specified depressive episodes: Secondary | ICD-10-CM | POA: Insufficient documentation

## 2012-10-10 DIAGNOSIS — I2510 Atherosclerotic heart disease of native coronary artery without angina pectoris: Secondary | ICD-10-CM | POA: Insufficient documentation

## 2012-10-10 DIAGNOSIS — I059 Rheumatic mitral valve disease, unspecified: Secondary | ICD-10-CM | POA: Insufficient documentation

## 2012-10-10 DIAGNOSIS — I2789 Other specified pulmonary heart diseases: Secondary | ICD-10-CM | POA: Insufficient documentation

## 2012-10-10 DIAGNOSIS — Z5189 Encounter for other specified aftercare: Secondary | ICD-10-CM | POA: Insufficient documentation

## 2012-10-10 DIAGNOSIS — I501 Left ventricular failure: Secondary | ICD-10-CM | POA: Insufficient documentation

## 2012-10-10 DIAGNOSIS — I252 Old myocardial infarction: Secondary | ICD-10-CM | POA: Insufficient documentation

## 2012-10-10 DIAGNOSIS — Z7982 Long term (current) use of aspirin: Secondary | ICD-10-CM | POA: Insufficient documentation

## 2012-10-10 DIAGNOSIS — I2589 Other forms of chronic ischemic heart disease: Secondary | ICD-10-CM | POA: Insufficient documentation

## 2012-10-10 DIAGNOSIS — I472 Ventricular tachycardia, unspecified: Secondary | ICD-10-CM | POA: Insufficient documentation

## 2012-10-10 DIAGNOSIS — D509 Iron deficiency anemia, unspecified: Secondary | ICD-10-CM | POA: Insufficient documentation

## 2012-10-10 DIAGNOSIS — Z9861 Coronary angioplasty status: Secondary | ICD-10-CM | POA: Insufficient documentation

## 2012-10-10 DIAGNOSIS — E785 Hyperlipidemia, unspecified: Secondary | ICD-10-CM | POA: Insufficient documentation

## 2012-10-10 DIAGNOSIS — I959 Hypotension, unspecified: Secondary | ICD-10-CM | POA: Insufficient documentation

## 2012-10-10 DIAGNOSIS — F329 Major depressive disorder, single episode, unspecified: Secondary | ICD-10-CM | POA: Insufficient documentation

## 2012-10-10 DIAGNOSIS — I509 Heart failure, unspecified: Secondary | ICD-10-CM | POA: Insufficient documentation

## 2012-10-10 DIAGNOSIS — I4729 Other ventricular tachycardia: Secondary | ICD-10-CM | POA: Insufficient documentation

## 2012-10-10 DIAGNOSIS — I447 Left bundle-branch block, unspecified: Secondary | ICD-10-CM | POA: Insufficient documentation

## 2012-10-10 DIAGNOSIS — Z950 Presence of cardiac pacemaker: Secondary | ICD-10-CM | POA: Insufficient documentation

## 2012-10-10 DIAGNOSIS — Z8674 Personal history of sudden cardiac arrest: Secondary | ICD-10-CM | POA: Insufficient documentation

## 2012-10-10 DIAGNOSIS — R092 Respiratory arrest: Secondary | ICD-10-CM | POA: Insufficient documentation

## 2012-10-10 DIAGNOSIS — I4891 Unspecified atrial fibrillation: Secondary | ICD-10-CM | POA: Insufficient documentation

## 2012-10-10 DIAGNOSIS — Z951 Presence of aortocoronary bypass graft: Secondary | ICD-10-CM | POA: Insufficient documentation

## 2012-10-13 ENCOUNTER — Encounter (HOSPITAL_COMMUNITY): Payer: Medicare Other

## 2012-10-13 ENCOUNTER — Ambulatory Visit (INDEPENDENT_AMBULATORY_CARE_PROVIDER_SITE_OTHER): Payer: Medicare Other | Admitting: *Deleted

## 2012-10-13 DIAGNOSIS — I4891 Unspecified atrial fibrillation: Secondary | ICD-10-CM

## 2012-10-13 DIAGNOSIS — Z7901 Long term (current) use of anticoagulants: Secondary | ICD-10-CM

## 2012-10-15 ENCOUNTER — Encounter (HOSPITAL_COMMUNITY): Payer: Medicare Other

## 2012-10-17 ENCOUNTER — Encounter (HOSPITAL_COMMUNITY): Payer: Medicare Other

## 2012-10-20 ENCOUNTER — Encounter (HOSPITAL_COMMUNITY): Payer: Medicare Other

## 2012-10-22 ENCOUNTER — Encounter (HOSPITAL_COMMUNITY): Payer: Medicare Other

## 2012-10-24 ENCOUNTER — Encounter (HOSPITAL_COMMUNITY): Payer: Medicare Other

## 2012-10-27 ENCOUNTER — Encounter (HOSPITAL_COMMUNITY): Payer: Medicare Other

## 2012-10-29 ENCOUNTER — Encounter (HOSPITAL_COMMUNITY): Payer: Medicare Other

## 2012-10-31 ENCOUNTER — Encounter (HOSPITAL_COMMUNITY): Payer: Medicare Other

## 2012-11-03 ENCOUNTER — Encounter (HOSPITAL_COMMUNITY): Payer: Medicare Other

## 2012-11-05 ENCOUNTER — Encounter (HOSPITAL_COMMUNITY): Payer: Medicare Other

## 2012-11-07 ENCOUNTER — Encounter (HOSPITAL_COMMUNITY): Payer: Medicare Other

## 2012-11-10 ENCOUNTER — Encounter (HOSPITAL_COMMUNITY): Payer: Self-pay | Attending: Cardiology

## 2012-11-12 ENCOUNTER — Encounter (HOSPITAL_COMMUNITY): Payer: Self-pay

## 2012-11-14 ENCOUNTER — Encounter (HOSPITAL_COMMUNITY): Payer: Self-pay

## 2012-11-17 ENCOUNTER — Encounter (HOSPITAL_COMMUNITY): Payer: Self-pay

## 2012-11-19 ENCOUNTER — Encounter (HOSPITAL_COMMUNITY): Payer: Self-pay

## 2012-11-21 ENCOUNTER — Encounter (HOSPITAL_COMMUNITY): Payer: Self-pay

## 2012-11-24 ENCOUNTER — Ambulatory Visit (INDEPENDENT_AMBULATORY_CARE_PROVIDER_SITE_OTHER): Payer: Medicare Other | Admitting: Pharmacist

## 2012-11-24 ENCOUNTER — Encounter (HOSPITAL_COMMUNITY): Payer: Self-pay

## 2012-11-24 DIAGNOSIS — Z7901 Long term (current) use of anticoagulants: Secondary | ICD-10-CM

## 2012-11-24 DIAGNOSIS — I4891 Unspecified atrial fibrillation: Secondary | ICD-10-CM

## 2012-11-26 ENCOUNTER — Encounter (HOSPITAL_COMMUNITY): Payer: Self-pay

## 2012-11-28 ENCOUNTER — Encounter (HOSPITAL_COMMUNITY): Payer: Self-pay

## 2012-12-01 ENCOUNTER — Encounter (HOSPITAL_COMMUNITY): Payer: Self-pay

## 2012-12-03 ENCOUNTER — Encounter (HOSPITAL_COMMUNITY): Payer: Self-pay

## 2012-12-05 ENCOUNTER — Encounter (HOSPITAL_COMMUNITY): Payer: Self-pay

## 2012-12-08 ENCOUNTER — Encounter (HOSPITAL_COMMUNITY): Payer: Medicare Other

## 2012-12-10 ENCOUNTER — Encounter (HOSPITAL_COMMUNITY): Payer: Medicare Other

## 2012-12-12 ENCOUNTER — Encounter (HOSPITAL_COMMUNITY): Payer: Medicare Other

## 2012-12-15 ENCOUNTER — Encounter (HOSPITAL_COMMUNITY): Payer: Medicare Other

## 2012-12-17 ENCOUNTER — Encounter (HOSPITAL_COMMUNITY): Payer: Medicare Other

## 2012-12-18 ENCOUNTER — Telehealth: Payer: Self-pay | Admitting: Cardiology

## 2012-12-18 NOTE — Telephone Encounter (Signed)
Pt tikosyn 250 mcg [facility], is out and needs asap, I asked her to call us before she is out of med in the future, pls call [phone] if problem

## 2012-12-19 ENCOUNTER — Encounter: Payer: Self-pay | Admitting: Internal Medicine

## 2012-12-19 ENCOUNTER — Ambulatory Visit (INDEPENDENT_AMBULATORY_CARE_PROVIDER_SITE_OTHER): Payer: Medicare Other | Admitting: Internal Medicine

## 2012-12-19 ENCOUNTER — Encounter (HOSPITAL_COMMUNITY): Payer: Medicare Other

## 2012-12-19 VITALS — BP 110/58 | HR 80 | Ht 65.0 in | Wt 143.6 lb

## 2012-12-19 DIAGNOSIS — I498 Other specified cardiac arrhythmias: Secondary | ICD-10-CM

## 2012-12-19 DIAGNOSIS — I4891 Unspecified atrial fibrillation: Secondary | ICD-10-CM

## 2012-12-19 LAB — MAGNESIUM: Magnesium: 2.2 mg/dL (ref 1.5–2.5)

## 2012-12-19 LAB — PACEMAKER DEVICE OBSERVATION
ATRIAL PACING PM: 2.46
BAMS-0001: 170 {beats}/min
BATTERY VOLTAGE: 2.96 V
RV LEAD IMPEDENCE PM: 536 Ohm
RV LEAD THRESHOLD: 1 V
VENTRICULAR PACING PM: 0.03

## 2012-12-19 LAB — BASIC METABOLIC PANEL
BUN: 15 mg/dL (ref 6–23)
Chloride: 107 mEq/L (ref 96–112)
Potassium: 3.9 mEq/L (ref 3.5–5.1)

## 2012-12-19 MED ORDER — DOFETILIDE 250 MCG PO CAPS: 250.0000 ug | ORAL_CAPSULE | Freq: Two times a day (BID) | ORAL | Status: DC

## 2012-12-19 NOTE — Assessment & Plan Note (Signed)
Normal pacemaker function See Pace Art report No changes today  

## 2012-12-19 NOTE — Patient Instructions (Addendum)
Your physician wants you to follow-up in: 6 months with the device clinic and 12 months with Dr Johney Frame Bonita Quin will receive a reminder letter in the mail two months in advance. If you don't receive a letter, please call our office to schedule the follow-up appointment.   Your physician has recommended you make the following change in your medication:  1) Stop Digoxin  Your physician recommends that you return for lab work BMP and Mag today

## 2012-12-19 NOTE — Assessment & Plan Note (Signed)
Well controlled No change required today  bmet and Mg today on tikosyn Stop digoxin

## 2012-12-19 NOTE — Telephone Encounter (Signed)
Was called in today while she was at her appointment with Dr Johney Frame

## 2012-12-19 NOTE — Progress Notes (Signed)
PCP:  Maryelizabeth Rowan, MD Primary Cardiologist:  Dr [place]  The patient presents today for routine electrophysiology followup.  She has a h/o atrial fibrillation and bradycardia s/p PPM implantation (MDT) implantation 05/19/2008 by Dr Reyes Ivan.  She remains very active .  She denies symptoms of palpitations, chest pain, shortness of breath, orthopnea, PND, lower extremity edema, dizziness, presyncope, syncope, or neurologic sequela. The patient is tolerating medications without difficulties and is otherwise without complaint today.    Past Medical History  Diagnosis Date  . Coronary artery disease     Prior stent to RCA in 1996 and 2008; s/p CAB in 2009 with MV repair and Maze  . Atrial fibrillation     in sinus on Tikosyn  . CHF (congestive heart failure)     EF 35% per echo in 2009  . Hypertension   . Anxiety   . Depression   . Hyperlipidemia   . Mitral insufficiency     s/p MV repair 2009  . IDA (iron deficiency anemia)   . Subarachnoid hemorrhage   . LBBB (left bundle branch block)   . Chronic anticoagulation   . High risk medication use     Tikosyn  . Tachycardia-bradycardia     s/p PPM  . Pacemaker    Past Surgical History  Procedure Laterality Date  . Cardiac catheterization  03/19/2007    EF 45%. THERE IS 2+ MITRAL INSUFFICIENCY. THERE IS MODERATE INFERIOR WALL HYPOKINESIA WITH OVERALL MILD TO MODERATE LV DYSFUNCTION  . Coronary stent placement  1996, AND 03/2007    RIGHT CORONARY  . Removal of colon polyp      AND A LARGE CECAL POLYP  . Coronary artery bypass graft  04/2008    INCLUDING A MITRAL VALVE REPAIR AND A LEFT SIDED MAZE PROCEDURE  . Coronary artery bypass graft      X2 with LIMA to LAD and SVG to PD and WITH A LEFT SIDED MAZE PROCEDURE & MITRAL VALVE REPAIR. THIS INCLUDED AN LIMA GRAFT TO THE LAD, AND A SAPHENOUS VEIN GRAFT TO THE PDA.  Marland Kitchen Transthoracic echocardiogram  06/21/2008    EF 35%  . Pacemaker insertion      MDT EnRhythm implanted for mobitz II AV  block by Dr Reyes Ivan  . Mitral valve repair      Current Outpatient Prescriptions  Medication Sig Dispense Refill  . Cholecalciferol (VITAMIN D) 2000 UNITS tablet Take 2,000 Units by mouth daily.        Marland Kitchen dofetilide (TIKOSYN) 250 MCG capsule Take 1 capsule (250 mcg total) by mouth 2 (two) times daily.  90 capsule  1  . ezetimibe (ZETIA) 10 MG tablet Take 1 tablet (10 mg total) by mouth daily.  90 tablet  3  . furosemide (LASIX) 20 MG tablet Take 1 tablet (20 mg total) by mouth daily.  30 tablet  0  . guaiFENesin (MUCINEX) 600 MG 12 hr tablet Take 1 tablet (600 mg total) by mouth 2 (two) times daily.  14 tablet  0  . LORazepam (ATIVAN) 0.5 MG tablet Take 0.5 mg by mouth every 8 (eight) hours as needed.      . metoprolol tartrate (LOPRESSOR) 25 MG tablet Take 0.5 tablets (12.5 mg total) by mouth 2 (two) times daily.  90 tablet  3  . pantoprazole (PROTONIX) 40 MG tablet Take 1 tablet (40 mg total) by mouth daily.  90 tablet  3  . potassium chloride (K-DUR) 10 MEQ tablet Take 1 tablet (10 mEq total) by mouth  2 (two) times daily.  30 tablet  0  . warfarin (COUMADIN) 1 MG tablet Take 1 tablet (1 mg total) by mouth as directed.  90 tablet  3   No current facility-administered medications for this visit.    Allergies  Allergen Reactions  . Amiodarone     unknown  . Diphenhydramine Hcl Other (See Comments)    unknown    History   Social History  . Marital Status: Married    Spouse Name: N/A    Number of Children: N/A  . Years of Education: N/A   Occupational History  . Not on file.   Social History Main Topics  . Smoking status: Former Smoker    Quit date: 05/06/1996  . Smokeless tobacco: Never Used  . Alcohol Use: No  . Drug Use: No  . Sexually Active: No   Other Topics Concern  . Not on file   Social History Narrative  . No narrative on file    Family History  Problem Relation Age of Onset  . Heart attack Mother   . Cancer Father    Physical Exam: Filed Vitals:    12/19/12 1021  BP: 110/58  Pulse: 80  Height: 5\' 5"  (1.651 m)  Weight: 143 lb 9.6 oz (65.137 kg)    GEN- The patient is well appearing, alert and oriented x 3 today.   Head- normocephalic, atraumatic Eyes-  Sclera clear, conjunctiva pink Ears- hearing intact Oropharynx- clear Neck- supple Lungs- Clear to ausculation bilaterally, normal work of breathing Chest- pacemaker pocket is well healed Heart- Regular rate and rhythm, no murmurs, rubs or gallops, PMI not laterally displaced GI- soft, NT, ND, + BS Extremities- no clubbing, cyanosis, or edema  Pacemaker interrogation- reviewed in detail today,  See PACEART report ekg today reveals sinus rhythm, LBBB, QTc 433  Assessment and Plan:

## 2012-12-22 ENCOUNTER — Encounter (HOSPITAL_COMMUNITY): Payer: Medicare Other

## 2012-12-23 ENCOUNTER — Other Ambulatory Visit: Payer: Self-pay | Admitting: *Deleted

## 2012-12-23 DIAGNOSIS — I4891 Unspecified atrial fibrillation: Secondary | ICD-10-CM

## 2012-12-23 MED ORDER — DOFETILIDE 250 MCG PO CAPS: 250.0000 ug | ORAL_CAPSULE | Freq: Two times a day (BID) | ORAL | Status: DC

## 2012-12-24 ENCOUNTER — Encounter (HOSPITAL_COMMUNITY): Payer: Medicare Other

## 2012-12-26 ENCOUNTER — Encounter (HOSPITAL_COMMUNITY): Payer: Medicare Other

## 2012-12-29 ENCOUNTER — Encounter (HOSPITAL_COMMUNITY): Payer: Medicare Other

## 2012-12-30 ENCOUNTER — Other Ambulatory Visit: Payer: Self-pay | Admitting: Cardiology

## 2012-12-31 ENCOUNTER — Encounter (HOSPITAL_COMMUNITY): Payer: Medicare Other

## 2013-01-02 ENCOUNTER — Encounter (HOSPITAL_COMMUNITY): Payer: Medicare Other

## 2013-01-05 ENCOUNTER — Ambulatory Visit (INDEPENDENT_AMBULATORY_CARE_PROVIDER_SITE_OTHER): Payer: Medicare Other | Admitting: Pharmacist

## 2013-01-05 ENCOUNTER — Encounter (HOSPITAL_COMMUNITY): Payer: Medicare Other

## 2013-01-05 DIAGNOSIS — I4891 Unspecified atrial fibrillation: Secondary | ICD-10-CM

## 2013-01-05 DIAGNOSIS — Z7901 Long term (current) use of anticoagulants: Secondary | ICD-10-CM

## 2013-01-05 LAB — POCT INR: INR: 2.3

## 2013-01-30 ENCOUNTER — Other Ambulatory Visit: Payer: Self-pay | Admitting: Cardiology

## 2013-02-16 ENCOUNTER — Ambulatory Visit (INDEPENDENT_AMBULATORY_CARE_PROVIDER_SITE_OTHER): Payer: Medicare Other | Admitting: Pharmacist

## 2013-02-16 DIAGNOSIS — Z7901 Long term (current) use of anticoagulants: Secondary | ICD-10-CM

## 2013-02-16 DIAGNOSIS — I4891 Unspecified atrial fibrillation: Secondary | ICD-10-CM

## 2013-02-16 LAB — POCT INR: INR: 1.6

## 2013-02-21 ENCOUNTER — Emergency Department (HOSPITAL_COMMUNITY): Payer: Medicare Other

## 2013-02-21 ENCOUNTER — Inpatient Hospital Stay (HOSPITAL_COMMUNITY)
Admission: EM | Admit: 2013-02-21 | Discharge: 2013-02-24 | DRG: 293 | Disposition: A | Discharge status: Home / Self Care | Payer: Medicare Other | Attending: Internal Medicine | Admitting: Internal Medicine

## 2013-02-21 ENCOUNTER — Encounter (HOSPITAL_COMMUNITY): Payer: Self-pay | Admitting: *Deleted

## 2013-02-21 DIAGNOSIS — I4891 Unspecified atrial fibrillation: Secondary | ICD-10-CM

## 2013-02-21 DIAGNOSIS — J209 Acute bronchitis, unspecified: Secondary | ICD-10-CM

## 2013-02-21 DIAGNOSIS — Z8249 Family history of ischemic heart disease and other diseases of the circulatory system: Secondary | ICD-10-CM

## 2013-02-21 DIAGNOSIS — Z87891 Personal history of nicotine dependence: Secondary | ICD-10-CM

## 2013-02-21 DIAGNOSIS — I2510 Atherosclerotic heart disease of native coronary artery without angina pectoris: Secondary | ICD-10-CM

## 2013-02-21 DIAGNOSIS — Z809 Family history of malignant neoplasm, unspecified: Secondary | ICD-10-CM

## 2013-02-21 DIAGNOSIS — Z7901 Long term (current) use of anticoagulants: Secondary | ICD-10-CM

## 2013-02-21 DIAGNOSIS — I498 Other specified cardiac arrhythmias: Secondary | ICD-10-CM

## 2013-02-21 DIAGNOSIS — E785 Hyperlipidemia, unspecified: Secondary | ICD-10-CM | POA: Diagnosis present

## 2013-02-21 DIAGNOSIS — E7800 Pure hypercholesterolemia, unspecified: Secondary | ICD-10-CM

## 2013-02-21 DIAGNOSIS — I509 Heart failure, unspecified: Secondary | ICD-10-CM

## 2013-02-21 DIAGNOSIS — R6889 Other general symptoms and signs: Secondary | ICD-10-CM

## 2013-02-21 DIAGNOSIS — Z951 Presence of aortocoronary bypass graft: Secondary | ICD-10-CM

## 2013-02-21 DIAGNOSIS — F411 Generalized anxiety disorder: Secondary | ICD-10-CM | POA: Diagnosis present

## 2013-02-21 DIAGNOSIS — Z950 Presence of cardiac pacemaker: Secondary | ICD-10-CM

## 2013-02-21 DIAGNOSIS — J40 Bronchitis, not specified as acute or chronic: Secondary | ICD-10-CM

## 2013-02-21 DIAGNOSIS — J189 Pneumonia, unspecified organism: Secondary | ICD-10-CM

## 2013-02-21 DIAGNOSIS — F329 Major depressive disorder, single episode, unspecified: Secondary | ICD-10-CM | POA: Diagnosis present

## 2013-02-21 DIAGNOSIS — F3289 Other specified depressive episodes: Secondary | ICD-10-CM | POA: Diagnosis present

## 2013-02-21 DIAGNOSIS — I5022 Chronic systolic (congestive) heart failure: Secondary | ICD-10-CM

## 2013-02-21 DIAGNOSIS — I5023 Acute on chronic systolic (congestive) heart failure: Principal | ICD-10-CM

## 2013-02-21 DIAGNOSIS — D509 Iron deficiency anemia, unspecified: Secondary | ICD-10-CM | POA: Diagnosis present

## 2013-02-21 DIAGNOSIS — I447 Left bundle-branch block, unspecified: Secondary | ICD-10-CM | POA: Diagnosis present

## 2013-02-21 LAB — CBC WITH DIFFERENTIAL/PLATELET
Basophils Absolute: 0.1 10*3/uL (ref 0.0–0.1)
Eosinophils Absolute: 0.1 10*3/uL (ref 0.0–0.7)
Eosinophils Relative: 0 % (ref 0–5)
HCT: 33.5 % — ABNORMAL LOW (ref 36.0–46.0)
Lymphocytes Relative: 9 % — ABNORMAL LOW (ref 12–46)
Lymphs Abs: 1.3 10*3/uL (ref 0.7–4.0)
MCH: 23.1 pg — ABNORMAL LOW (ref 26.0–34.0)
MCV: 74.3 fL — ABNORMAL LOW (ref 78.0–100.0)
Monocytes Absolute: 0.6 10*3/uL (ref 0.1–1.0)
RDW: 16.8 % — ABNORMAL HIGH (ref 11.5–15.5)
WBC: 14.9 10*3/uL — ABNORMAL HIGH (ref 4.0–10.5)

## 2013-02-21 LAB — URINALYSIS, ROUTINE W REFLEX MICROSCOPIC
Bilirubin Urine: NEGATIVE
Hgb urine dipstick: NEGATIVE
Ketones, ur: NEGATIVE mg/dL
Nitrite: NEGATIVE
Protein, ur: NEGATIVE mg/dL
Urobilinogen, UA: 0.2 mg/dL (ref 0.0–1.0)

## 2013-02-21 LAB — POCT I-STAT, CHEM 8
BUN: 22 mg/dL (ref 6–23)
Creatinine, Ser: 0.8 mg/dL (ref 0.50–1.10)
Potassium: 3.8 mEq/L (ref 3.5–5.1)
Sodium: 141 mEq/L (ref 135–145)

## 2013-02-21 LAB — URINE MICROSCOPIC-ADD ON

## 2013-02-21 LAB — POCT I-STAT TROPONIN I

## 2013-02-21 LAB — TSH: TSH: 0.954 u[IU]/mL (ref 0.350–4.500)

## 2013-02-21 MED ORDER — ONDANSETRON HCL 4 MG PO TABS: 4.0000 mg | ORAL_TABLET | Freq: Four times a day (QID) | ORAL | Status: DC | PRN

## 2013-02-21 MED ORDER — PANTOPRAZOLE SODIUM 40 MG PO TBEC
40.0000 mg | DELAYED_RELEASE_TABLET | Freq: Every day | ORAL | Status: DC
  Administered 2013-02-21 – 2013-02-24 (×4): 40 mg via ORAL
  Filled 2013-02-21 (×2): qty 1
  Filled 2013-02-21: qty 2

## 2013-02-21 MED ORDER — METOPROLOL TARTRATE 12.5 MG HALF TABLET
12.5000 mg | ORAL_TABLET | Freq: Two times a day (BID) | ORAL | Status: DC
  Administered 2013-02-21 – 2013-02-24 (×6): 12.5 mg via ORAL
  Filled 2013-02-21 (×7): qty 1

## 2013-02-21 MED ORDER — WARFARIN SODIUM 2 MG PO TABS: 2.0000 mg | ORAL_TABLET | Freq: Every day | ORAL | Status: DC

## 2013-02-21 MED ORDER — WARFARIN SODIUM 4 MG PO TABS
4.0000 mg | ORAL_TABLET | Freq: Once | ORAL | Status: AC
  Administered 2013-02-21: 4 mg via ORAL
  Filled 2013-02-21: qty 1

## 2013-02-21 MED ORDER — ACETAMINOPHEN 325 MG PO TABS: 650.0000 mg | ORAL_TABLET | Freq: Four times a day (QID) | ORAL | Status: DC | PRN

## 2013-02-21 MED ORDER — EZETIMIBE 10 MG PO TABS
10.0000 mg | ORAL_TABLET | Freq: Every day | ORAL | Status: DC
  Administered 2013-02-21 – 2013-02-24 (×4): 10 mg via ORAL
  Filled 2013-02-21 (×4): qty 1

## 2013-02-21 MED ORDER — MORPHINE SULFATE 2 MG/ML IJ SOLN: 1.0000 mg | INTRAMUSCULAR | Status: DC | PRN

## 2013-02-21 MED ORDER — SODIUM CHLORIDE 0.9 % IV SOLN: INTRAVENOUS | Status: DC

## 2013-02-21 MED ORDER — GUAIFENESIN ER 600 MG PO TB12
1200.0000 mg | ORAL_TABLET | Freq: Two times a day (BID) | ORAL | Status: DC
  Administered 2013-02-21 – 2013-02-24 (×7): 1200 mg via ORAL
  Filled 2013-02-21 (×8): qty 2

## 2013-02-21 MED ORDER — HEPARIN SODIUM (PORCINE) 5000 UNIT/ML IJ SOLN
5000.0000 [IU] | Freq: Three times a day (TID) | INTRAMUSCULAR | Status: DC
  Administered 2013-02-21 – 2013-02-24 (×8): 5000 [IU] via SUBCUTANEOUS
  Filled 2013-02-21 (×12): qty 1

## 2013-02-21 MED ORDER — LEVOFLOXACIN 500 MG PO TABS
500.0000 mg | ORAL_TABLET | Freq: Every day | ORAL | Status: DC
  Administered 2013-02-21 – 2013-02-24 (×4): 500 mg via ORAL
  Filled 2013-02-21 (×4): qty 1

## 2013-02-21 MED ORDER — LORAZEPAM 0.5 MG PO TABS
0.5000 mg | ORAL_TABLET | Freq: Three times a day (TID) | ORAL | Status: DC | PRN
  Administered 2013-02-21 – 2013-02-23 (×3): 0.5 mg via ORAL
  Filled 2013-02-21 (×3): qty 1

## 2013-02-21 MED ORDER — FUROSEMIDE 10 MG/ML IJ SOLN
40.0000 mg | Freq: Two times a day (BID) | INTRAMUSCULAR | Status: DC
  Administered 2013-02-21 – 2013-02-23 (×4): 40 mg via INTRAVENOUS
  Filled 2013-02-21 (×6): qty 4

## 2013-02-21 MED ORDER — HYDROCODONE-ACETAMINOPHEN 5-325 MG PO TABS: 1.0000 | ORAL_TABLET | ORAL | Status: DC | PRN

## 2013-02-21 MED ORDER — POTASSIUM CHLORIDE CRYS ER 20 MEQ PO TBCR
60.0000 meq | EXTENDED_RELEASE_TABLET | Freq: Once | ORAL | Status: AC
  Administered 2013-02-21: 60 meq via ORAL
  Filled 2013-02-21: qty 3

## 2013-02-21 MED ORDER — SODIUM CHLORIDE 0.9 % IJ SOLN
3.0000 mL | Freq: Two times a day (BID) | INTRAMUSCULAR | Status: DC
  Administered 2013-02-22 – 2013-02-23 (×3): 3 mL via INTRAVENOUS

## 2013-02-21 MED ORDER — ONDANSETRON HCL 4 MG/2ML IJ SOLN: 4.0000 mg | Freq: Four times a day (QID) | INTRAMUSCULAR | Status: DC | PRN

## 2013-02-21 MED ORDER — AZELASTINE HCL 0.1 % NA SOLN
1.0000 | Freq: Two times a day (BID) | NASAL | Status: DC
  Administered 2013-02-21 – 2013-02-24 (×6): 1 via NASAL
  Filled 2013-02-21: qty 30

## 2013-02-21 MED ORDER — ACETAMINOPHEN 650 MG RE SUPP: 650.0000 mg | Freq: Four times a day (QID) | RECTAL | Status: DC | PRN

## 2013-02-21 MED ORDER — WARFARIN - PHARMACIST DOSING INPATIENT: Freq: Every day | Status: DC

## 2013-02-21 MED ORDER — DOFETILIDE 250 MCG PO CAPS
250.0000 ug | ORAL_CAPSULE | Freq: Two times a day (BID) | ORAL | Status: DC
  Administered 2013-02-21 – 2013-02-24 (×6): 250 ug via ORAL
  Filled 2013-02-21 (×8): qty 1

## 2013-02-21 NOTE — ED Notes (Signed)
Returned from Radiology.

## 2013-02-21 NOTE — H&P (Signed)
[facility] History and Physical  [name] WUJ:[id number] DOB: Feb 21, 1930 DOA: 02/21/2013  Referring physician: Juliet Rude. Rubin Payor, MD PCP: Maryelizabeth Rowan, MD  Specialists: [place] and Allred  Chief Complaint: SOB  HPI: [name] is a 79 y.o. female with past medical history of CAD, atrial fibrillation and CHF. Patient said she was in her usual state of health till yesterday. She started to have some shortness of breath and cough. She woke up last night because she couldn't breathe, this morning time worth with more cough and whitish sputum so she called EMT and brought to the hospital for further evaluation. Initial evaluation in the emergency department the chest x-ray showed vascular congestion consistent with mild pulmonary edema and leukocytosis of 14.9. She also has BNP of 1700. She denies any chest pain, denies any palpitations.  Review of Systems:   Review of Systems  Constitutional: Negative for fever, chills and weight loss.  HENT: Negative for tinnitus and ear discharge.   Eyes: Negative for blurred vision and photophobia.  Respiratory: Positive for shortness of breath. Negative for cough, sputum production and wheezing.   Cardiovascular: Positive for orthopnea and PND. Negative for chest pain, palpitations and leg swelling.  Gastrointestinal: Negative for heartburn, nausea, vomiting and diarrhea.  Genitourinary: Negative for dysuria and hematuria.  Musculoskeletal: Negative for myalgias and back pain.  Neurological: Negative for dizziness, tremors, sensory change, speech change and headaches.  Endo/Heme/Allergies: Does not bruise/bleed easily.  Psychiatric/Behavioral: Negative for depression and suicidal ideas.    Past Medical History  Diagnosis Date  . Coronary artery disease     Prior stent to RCA in 1996 and 2008; s/p CAB in 2009 with MV repair and Maze  . Atrial fibrillation     in sinus on Tikosyn  . CHF (congestive heart failure)     EF 35% per  echo in 2009  . Hypertension   . Anxiety   . Depression   . Hyperlipidemia   . Mitral insufficiency     s/p MV repair 2009  . IDA (iron deficiency anemia)   . Subarachnoid hemorrhage   . LBBB (left bundle branch block)   . Chronic anticoagulation   . High risk medication use     Tikosyn  . Tachycardia-bradycardia     s/p PPM  . Pacemaker    Past Surgical History  Procedure Laterality Date  . Cardiac catheterization  03/19/2007    EF 45%. THERE IS 2+ MITRAL INSUFFICIENCY. THERE IS MODERATE INFERIOR WALL HYPOKINESIA WITH OVERALL MILD TO MODERATE LV DYSFUNCTION  . Coronary stent placement  1996, AND 03/2007    RIGHT CORONARY  . Removal of colon polyp      AND A LARGE CECAL POLYP  . Coronary artery bypass graft  04/2008    INCLUDING A MITRAL VALVE REPAIR AND A LEFT SIDED MAZE PROCEDURE  . Coronary artery bypass graft      X2 with LIMA to LAD and SVG to PD and WITH A LEFT SIDED MAZE PROCEDURE & MITRAL VALVE REPAIR. THIS INCLUDED AN LIMA GRAFT TO THE LAD, AND A SAPHENOUS VEIN GRAFT TO THE PDA.  Marland Kitchen Transthoracic echocardiogram  06/21/2008    EF 35%  . Pacemaker insertion  05/19/2008    MDT EnRhythm implanted for mobitz II AV block by Dr Reyes Ivan  . Mitral valve repair     Social History:  reports that she quit smoking about 16 years ago. She has never used smokeless tobacco. She reports that she does not drink alcohol or use  illicit drugs.   Allergies  Allergen Reactions  . Amiodarone     unknown  . Diphenhydramine Hcl Other (See Comments)    unknown    Family History  Problem Relation Age of Onset  . Heart attack Mother   . Cancer Father     Prior to Admission medications   Medication Sig Start Date End Date Taking? Authorizing Provider  azelastine (ASTELIN) 137 MCG/SPRAY nasal spray Place 1 spray into the nose 2 (two) times daily. Use in each nostril as directed   Yes Historical Provider, MD  Cholecalciferol (VITAMIN D) 2000 UNITS tablet Take 2,000 Units by mouth daily.      Yes Historical Provider, MD  dofetilide (TIKOSYN) 250 MCG capsule Take 1 capsule (250 mcg total) by mouth 2 (two) times daily. 12/23/12  Yes Peter M [place], MD  ezetimibe (ZETIA) 10 MG tablet Take 10 mg by mouth daily.   Yes Historical Provider, MD  furosemide (LASIX) 20 MG tablet Take 1 tablet (20 mg total) by mouth daily. 04/11/12 04/11/13 Yes Laveda Norman, MD  guaiFENesin (MUCINEX) 600 MG 12 hr tablet Take 1 tablet (600 mg total) by mouth 2 (two) times daily. 04/11/12 04/11/13 Yes Laveda Norman, MD  LORazepam (ATIVAN) 0.5 MG tablet Take 0.5 mg by mouth every 8 (eight) hours as needed for anxiety.    Yes Historical Provider, MD  metoprolol tartrate (LOPRESSOR) 25 MG tablet Take 12.5 mg by mouth 2 (two) times daily.   Yes Historical Provider, MD  pantoprazole (PROTONIX) 40 MG tablet Take 40 mg by mouth daily.   Yes Historical Provider, MD  potassium chloride (K-DUR) 10 MEQ tablet Take 1 tablet (10 mEq total) by mouth 2 (two) times daily. 04/11/12 04/11/13 Yes Laveda Norman, MD  warfarin (COUMADIN) 1 MG tablet Take 2-3 mg by mouth daily. 3 mg daily except 2 mg on tuesdays and thursdays   Yes Historical Provider, MD   Physical Exam: Filed Vitals:   02/21/13 1015 02/21/13 1030 02/21/13 1045 02/21/13 1115  BP: 127/54 119/57 123/55 122/61  Pulse: 91 90 87 86  Temp:      TempSrc:      Resp: 25 20 18 21   Height:      Weight:      SpO2: 97% 97% 98% 97%   General appearance: alert, cooperative and no distress  Head: Normocephalic, without obvious abnormality, atraumatic  Eyes: conjunctivae/corneas clear. PERRL, EOM's intact. Fundi benign.  Nose: Nares normal. Septum midline. Mucosa normal. No drainage or sinus tenderness.  Throat: lips, mucosa, and tongue normal; teeth and gums normal  Neck: Supple, no masses, no cervical lymphadenopathy, no JVD appreciated, no meningeal signs Resp: clear to auscultation bilaterally  Chest wall: no tenderness  Cardio: regular rate and rhythm, S1, S2 normal, no murmur, click,  rub or gallop  GI: soft, non-tender; bowel sounds normal; no masses, no organomegaly  Extremities: extremities normal, atraumatic, no cyanosis or edema  Skin: Skin color, texture, turgor normal. No rashes or lesions  Neurologic: Alert and oriented X 3, normal strength and tone. Normal symmetric reflexes. Normal coordination and gait  Labs on Admission:  Basic Metabolic Panel:  Recent Labs Lab 02/21/13 0933  NA 141  K 3.8  CL 108  GLUCOSE 127*  BUN 22  CREATININE 0.80   Liver Function Tests: No results found for this basename: AST, ALT, ALKPHOS, BILITOT, PROT, ALBUMIN,  in the last 168 hours No results found for this basename: LIPASE, AMYLASE,  in the last 168  hours No results found for this basename: AMMONIA,  in the last 168 hours CBC:  Recent Labs Lab 02/21/13 0913 02/21/13 0933  WBC 14.9*  --   NEUTROABS 12.9*  --   HGB 10.4* 11.9*  HCT 33.5* 35.0*  MCV 74.3*  --   PLT 276  --    Cardiac Enzymes: No results found for this basename: CKTOTAL, CKMB, CKMBINDEX, TROPONINI,  in the last 168 hours  BNP (last 3 results)  Recent Labs  04/09/12 0818 04/10/12 0521 02/21/13 0925  PROBNP 5069.0* 1586.0* 1698.0*   CBG: No results found for this basename: GLUCAP,  in the last 168 hours  Radiological Exams on Admission: Dg Chest 2 View  02/21/2013   *RADIOLOGY REPORT*  Clinical Data: Shortness of breath  CHEST - 2 VIEW  Comparison: Chest radiograph 04/11/2012  Findings: Heart size is upper normal in stable.  A left chest wall dual lead pacemaker has leads terminating in the right atrium and right ventricle.  There are changes of median sternotomy for CABG and aortic valve replacement.  Pulmonary vascularity is mildly prominent.  There is diffuse interstitial prominence.  There are trace bilateral pleural effusions.  Atherosclerotic calcification and ectasia of the abdominal aorta are noted on the lateral view. The bones are diffusely osteopenic.  The bones are markedly  osteopenic.  No compression deformities or other acute bony abnormalities are identified.  IMPRESSION:  1.  Mild congestive heart failure pattern.  There is pulmonary vascular congestion with diffuse interstitial prominence, increased from prior chest radiograph, and trace bilateral pleural effusions. 2. Dual lead pacer and prior CABG and valvular replacement.   Original Report Authenticated By: Britta Mccreedy, M.D.    EKG: Independently reviewed.   Assessment/Plan Principal Problem:   Acute on chronic diastolic CHF (congestive heart failure) Active Problems:   CAD   Atrial fibrillation   Systolic CHF, chronic   Acute bronchitis   Acute on chronic systolic CHF -Mild acute on chronic systolic CHF, was shortness of breath, orthopnea and x-ray findings. -Started on Lasix, check 2-D echocardiogram. -Fluid restriction to 1200 cc per day, salt restriction, strict intake and output. -Check chest x-ray in a.m.  Acute bronchitis -Questionable acute bronchitis with cough and please say ptosis. -Started on Mucinex and Levaquin.  Atrial fibrillation -Patient follows with Dr. Sw[place]iland and Dr. Johney Frame. -She is on Tikosyn, Coumadin her INR is subtherapeutic. I have asked pharmacy to dose the Coumadin.  Status post pacemaker -History of atrial fibrillation with bradycardia, status post pacemaker placement 2009. -Following with Dr. Johney Frame.  Code Status: Full code Family Communication: Spoke with patient Disposition Plan: Inpatient, telemetry, spate length of stay of more than 2 midnights  Time spent: 70 minutes  [facility] A [facility] Pager [phone]  If 7PM-7AM, please contact night-coverage [email or web address] Password [facility] 02/21/2013, 11:54 AM

## 2013-02-21 NOTE — ED Notes (Signed)
Per report from [facility] daughter found pt in resp distress.  On EMS arrival pt was found to have tachypnea and SOB.  Wheezing and crackles noted on initial assessment.  CPAP was initiated, 1 SL NTG, and Albuterol 5mg  neb.  Pt responded positively and requested that the CPAP be discontinued.  Pt post interventions was able to speak in complete sentences and appeared to be in less distress.

## 2013-02-21 NOTE — ED Provider Notes (Signed)
History     CSN: [id number]  Arrival date & time 02/21/13  7829   First MD Initiated Contact with Patient 02/21/13 0848      Chief Complaint  Patient presents with  . Respiratory Distress    (Consider location/radiation/quality/duration/timing/severity/associated sxs/prior treatment) The history is provided by the patient.   patient presents with shortness of breath. Reportedly had oxygen sats of 84% at home. Per EMS sounded rhonchorous, had rails, and had wheezes. She was started on BiPAP and given a breathing treatment. She states she feels better now. She states she's had occasional dull chest pain On the left side. She states she's had a cough with white sputum. No fevers. No swelling or legs. No known sick contacts.  Past Medical History  Diagnosis Date  . Coronary artery disease     Prior stent to RCA in 1996 and 2008; s/p CAB in 2009 with MV repair and Maze  . Atrial fibrillation     in sinus on Tikosyn  . CHF (congestive heart failure)     EF 35% per echo in 2009  . Hypertension   . Anxiety   . Depression   . Hyperlipidemia   . Mitral insufficiency     s/p MV repair 2009  . IDA (iron deficiency anemia)   . Subarachnoid hemorrhage   . LBBB (left bundle branch block)   . Chronic anticoagulation   . High risk medication use     Tikosyn  . Tachycardia-bradycardia     s/p PPM  . Pacemaker     Past Surgical History  Procedure Laterality Date  . Cardiac catheterization  03/19/2007    EF 45%. THERE IS 2+ MITRAL INSUFFICIENCY. THERE IS MODERATE INFERIOR WALL HYPOKINESIA WITH OVERALL MILD TO MODERATE LV DYSFUNCTION  . Coronary stent placement  1996, AND 03/2007    RIGHT CORONARY  . Removal of colon polyp      AND A LARGE CECAL POLYP  . Coronary artery bypass graft  04/2008    INCLUDING A MITRAL VALVE REPAIR AND A LEFT SIDED MAZE PROCEDURE  . Coronary artery bypass graft      X2 with LIMA to LAD and SVG to PD and WITH A LEFT SIDED MAZE PROCEDURE & MITRAL VALVE REPAIR.  THIS INCLUDED AN LIMA GRAFT TO THE LAD, AND A SAPHENOUS VEIN GRAFT TO THE PDA.  Marland Kitchen Transthoracic echocardiogram  06/21/2008    EF 35%  . Pacemaker insertion  05/19/2008    MDT EnRhythm implanted for mobitz II AV block by Dr Reyes Ivan  . Mitral valve repair      Family History  Problem Relation Age of Onset  . Heart attack Mother   . Cancer Father     History  Substance Use Topics  . Smoking status: Former Smoker    Quit date: 05/06/1996  . Smokeless tobacco: Never Used  . Alcohol Use: No    OB History   Grav Para Term Preterm Abortions TAB SAB Ect Mult Living                  Review of Systems  Constitutional: Negative for activity change and appetite change.  HENT: Negative for neck stiffness.   Eyes: Negative for pain.  Respiratory: Positive for cough and shortness of breath. Negative for chest tightness.   Cardiovascular: Positive for chest pain. Negative for leg swelling.  Gastrointestinal: Negative for nausea, vomiting, abdominal pain and diarrhea.  Genitourinary: Negative for flank pain.  Musculoskeletal: Negative for back pain.  Skin:  Negative for rash.  Neurological: Negative for weakness, numbness and headaches.  Psychiatric/Behavioral: Negative for behavioral problems.    Allergies  Amiodarone and Diphenhydramine hcl  Home Medications   No current outpatient prescriptions on file.  BP 113/50  Pulse 88  Temp(Src) 98.5 F (36.9 C) (Oral)  Resp 19  Ht 5\' 5"  (1.651 m)  Wt 149 lb 1.6 oz (67.631 kg)  BMI 24.81 kg/m2  SpO2 100%  Physical Exam  Nursing note and vitals reviewed. Constitutional: She is oriented to person, place, and time. She appears well-developed and well-nourished.  HENT:  Head: Normocephalic and atraumatic.  Nasal cannula oxygen.  Eyes: EOM are normal. Pupils are equal, round, and reactive to light.  Neck: Normal range of motion. Neck supple.  Cardiovascular: Normal rate, regular rhythm and normal heart sounds.   No murmur  heard. Pulmonary/Chest: Effort normal. No respiratory distress. She has no wheezes. She has no rales.  Diffuse rhonchi  Abdominal: Soft. Bowel sounds are normal. She exhibits no distension. There is no tenderness. There is no rebound and no guarding.  Musculoskeletal: Normal range of motion.  Neurological: She is alert and oriented to person, place, and time. No cranial nerve deficit.  Skin: Skin is warm and dry.  Psychiatric: She has a normal mood and affect. Her speech is normal.    ED Course  Procedures (including critical care time)  Labs Reviewed  CBC WITH DIFFERENTIAL - Abnormal; Notable for the following:    WBC 14.9 (*)    Hemoglobin 10.4 (*)    HCT 33.5 (*)    MCV 74.3 (*)    MCH 23.1 (*)    RDW 16.8 (*)    Neutrophils Relative % 87 (*)    Neutro Abs 12.9 (*)    Lymphocytes Relative 9 (*)    All other components within normal limits  PRO B NATRIURETIC PEPTIDE - Abnormal; Notable for the following:    Pro B Natriuretic peptide (BNP) 1698.0 (*)    All other components within normal limits  PROTIME-INR - Abnormal; Notable for the following:    Prothrombin Time 16.1 (*)    All other components within normal limits  POCT I-STAT, CHEM 8 - Abnormal; Notable for the following:    Glucose, Bld 127 (*)    Hemoglobin 11.9 (*)    HCT 35.0 (*)    All other components within normal limits  TSH  HEMOGLOBIN A1C  URINALYSIS, ROUTINE W REFLEX MICROSCOPIC  POCT I-STAT TROPONIN I   Dg Chest 2 View  02/21/2013   *RADIOLOGY REPORT*  Clinical Data: Shortness of breath  CHEST - 2 VIEW  Comparison: Chest radiograph 04/11/2012  Findings: Heart size is upper normal in stable.  A left chest wall dual lead pacemaker has leads terminating in the right atrium and right ventricle.  There are changes of median sternotomy for CABG and aortic valve replacement.  Pulmonary vascularity is mildly prominent.  There is diffuse interstitial prominence.  There are trace bilateral pleural effusions.   Atherosclerotic calcification and ectasia of the abdominal aorta are noted on the lateral view. The bones are diffusely osteopenic.  The bones are markedly osteopenic.  No compression deformities or other acute bony abnormalities are identified.  IMPRESSION:  1.  Mild congestive heart failure pattern.  There is pulmonary vascular congestion with diffuse interstitial prominence, increased from prior chest radiograph, and trace bilateral pleural effusions. 2. Dual lead pacer and prior CABG and valvular replacement.   Original Report Authenticated By: Britta Mccreedy, M.D.  1. CHF (congestive heart failure)   2. Bronchitis   3. Acute bronchitis   4. Atrial fibrillation   5. Hypercholesteremia      Date: 02/21/2013  Rate: 101  Rhythm: sinus tachycardia  QRS Axis: normal  Intervals: normal  ST/T Wave abnormalities: nonspecific ST/T changes  Conduction Disutrbances:left bundle branch block  Narrative Interpretation:   Old EKG Reviewed: unchanged    MDM  Patient with shortness of breath. Was hypoxic at home. Likely combination of CHF and bronchitis. Oxygen was removed and the patient's sats went to 90% on room air. She'll be admitted internal medicine for further evaluation and treatment.        Juliet Rude. Rubin Payor, MD 02/21/13 1422

## 2013-02-21 NOTE — Progress Notes (Signed)
ANTICOAGULATION CONSULT NOTE - Initial Consult  Pharmacy Consult for coumadin Indication: atrial fibrillation  Allergies  Allergen Reactions  . Amiodarone     unknown  . Diphenhydramine Hcl Other (See Comments)    unknown    Patient Measurements: Height: 5\' 5"  (165.1 cm) Weight: 140 lb (63.504 kg) IBW/kg (Calculated) : 57  Vital Signs: Temp: 98.3 F (36.8 C) (05/17 0854) Temp src: Oral (05/17 0854) BP: 127/53 mmHg (05/17 1201) Pulse Rate: 90 (05/17 1201)  Labs:  Recent Labs  02/21/13 0913 02/21/13 0933  HGB 10.4* 11.9*  HCT 33.5* 35.0*  PLT 276  --   LABPROT 16.1*  --   INR 1.32  --   CREATININE  --  0.80    Estimated Creatinine Clearance: 50.5 ml/min (by C-G formula based on Cr of 0.8).   Medical History: Past Medical History  Diagnosis Date  . Coronary artery disease     Prior stent to RCA in 1996 and 2008; s/p CAB in 2009 with MV repair and Maze  . Atrial fibrillation     in sinus on Tikosyn  . CHF (congestive heart failure)     EF 35% per echo in 2009  . Hypertension   . Anxiety   . Depression   . Hyperlipidemia   . Mitral insufficiency     s/p MV repair 2009  . IDA (iron deficiency anemia)   . Subarachnoid hemorrhage   . LBBB (left bundle branch block)   . Chronic anticoagulation   . High risk medication use     Tikosyn  . Tachycardia-bradycardia     s/p PPM  . Pacemaker     Medications:   (Not in a hospital admission) Scheduled:  . azelastine  1 spray Each Nare BID  . dofetilide  250 mcg Oral BID  . ezetimibe  10 mg Oral Daily  . furosemide  40 mg Intravenous BID  . guaiFENesin  1,200 mg Oral BID  . heparin  5,000 Units Subcutaneous Q8H  . levofloxacin  500 mg Oral Daily  . metoprolol tartrate  12.5 mg Oral BID  . pantoprazole  40 mg Oral Daily  . potassium chloride  60 mEq Oral Once  . sodium chloride  3 mL Intravenous Q12H    Assessment: 79 yo with hx afib who was admitted for resp distress. She has been on chronic coumadin  for afib.   PTA 3mg  qday except 2mg  TTh  Goal of Therapy:  INR 2-3 Monitor platelets by anticoagulation protocol: Yes   Plan:   Coumadin 4mg  PO x1 Daily INR  Ulyses Southward Josephine 02/21/2013,12:46 PM

## 2013-02-21 NOTE — ED Notes (Signed)
Transported to Radiology

## 2013-02-21 NOTE — ED Notes (Signed)
Humidified O2 placed on patient for comfort.  Pt talking in complete sentences.  Resp symmetrical and unlabored. At the present.  RR of 18.  States that she is comfortable at the present.

## 2013-02-21 NOTE — ED Notes (Signed)
MD at bedside. 

## 2013-02-22 DIAGNOSIS — I5023 Acute on chronic systolic (congestive) heart failure: Principal | ICD-10-CM

## 2013-02-22 DIAGNOSIS — I369 Nonrheumatic tricuspid valve disorder, unspecified: Secondary | ICD-10-CM

## 2013-02-22 LAB — HEMOGLOBIN A1C
Hgb A1c MFr Bld: 5.7 % — ABNORMAL HIGH (ref <5.7)
Mean Plasma Glucose: 117 mg/dL — ABNORMAL HIGH (ref <117)

## 2013-02-22 LAB — BASIC METABOLIC PANEL
CO2: 28 mEq/L (ref 19–32)
Calcium: 9 mg/dL (ref 8.4–10.5)
Chloride: 105 mEq/L (ref 96–112)
Glucose, Bld: 87 mg/dL (ref 70–99)
Potassium: 4.1 mEq/L (ref 3.5–5.1)
Sodium: 140 mEq/L (ref 135–145)

## 2013-02-22 LAB — CBC
Hemoglobin: 9.7 g/dL — ABNORMAL LOW (ref 12.0–15.0)
MCV: 74 fL — ABNORMAL LOW (ref 78.0–100.0)
Platelets: 252 10*3/uL (ref 150–400)
RBC: 4.31 MIL/uL (ref 3.87–5.11)
WBC: 6.2 10*3/uL (ref 4.0–10.5)

## 2013-02-22 LAB — PRO B NATRIURETIC PEPTIDE: Pro B Natriuretic peptide (BNP): 2879 pg/mL — ABNORMAL HIGH (ref 0–450)

## 2013-02-22 MED ORDER — WARFARIN SODIUM 3 MG PO TABS
3.0000 mg | ORAL_TABLET | Freq: Every day | ORAL | Status: DC
  Administered 2013-02-22: 3 mg via ORAL
  Filled 2013-02-22 (×2): qty 1

## 2013-02-22 NOTE — Progress Notes (Signed)
[facility] PROGRESS NOTE  Assessment/Plan: *Acute on chronic systolic heart failure - 66.76 kg, -1.9L - On lasix IV BID. - Echo 5.18.2014 pending - Electrolytes and Cr. Stable, monitor. - Cont fluid restriction diet and Na.  Acute bronchitis: - afebrile SOB improved, cont levaquin.   Atrial fibrillation - INR sub thw coumadin per pharmacy.    Code Status: Full code  Family Communication: Spoke with patient  Disposition Plan: Inpatient, telemetry, spate length of stay of more than 2 midnights    Consultants:  none  Procedures:  Echo 5.18.2014  Antibiotics:  Levaquin  HPI/Subjective: Grumpy today. No complains, wants to be left alone.  Objective: Filed Vitals:   02/21/13 2100 02/22/13 0514 02/22/13 0647 02/22/13 0900  BP: 103/48 106/62  93/58  Pulse: 79 77  85  Temp: 98.6 F (37 C) 97.7 F (36.5 C)    TempSrc: Oral Oral    Resp: 18 18    Height:      Weight:   66.769 kg (147 lb 3.2 oz)   SpO2: 99% 99%      Intake/Output Summary (Last 24 hours) at 02/22/13 1128 Last data filed at 02/22/13 1052  Gross per 24 hour  Intake    360 ml  Output   2300 ml  Net  -1940 ml   Filed Weights   02/21/13 0854 02/21/13 1255 02/22/13 0647  Weight: 63.504 kg (140 lb) 67.631 kg (149 lb 1.6 oz) 66.769 kg (147 lb 3.2 oz)    Exam:  General: Alert, awake, oriented x3, in no acute distress.  HEENT: No bruits, no goiter. + JVD Heart: Regular rate and rhythm, without murmurs, rubs, gallops.  Lungs: Good air movement, clear to auscultation Abdomen: Soft, nontender, nondistended, positive bowel sounds.  Neuro: Grossly intact, nonfocal.   Data Reviewed: Basic Metabolic Panel:  Recent Labs Lab 02/21/13 0933 02/22/13 0505  NA 141 140  K 3.8 4.1  CL 108 105  CO2  --  28  GLUCOSE 127* 87  BUN 22 19  CREATININE 0.80 0.85  CALCIUM  --  9.0   Liver Function Tests: No results found for this basename: AST, ALT, ALKPHOS, BILITOT, PROT, ALBUMIN,  in the last  168 hours No results found for this basename: LIPASE, AMYLASE,  in the last 168 hours No results found for this basename: AMMONIA,  in the last 168 hours CBC:  Recent Labs Lab 02/21/13 0913 02/21/13 0933 02/22/13 0505  WBC 14.9*  --  6.2  NEUTROABS 12.9*  --   --   HGB 10.4* 11.9* 9.7*  HCT 33.5* 35.0* 31.9*  MCV 74.3*  --  74.0*  PLT 276  --  252   Cardiac Enzymes: No results found for this basename: CKTOTAL, CKMB, CKMBINDEX, TROPONINI,  in the last 168 hours BNP (last 3 results)  Recent Labs  04/10/12 0521 02/21/13 0925 02/22/13 0002  PROBNP 1586.0* 1698.0* 2879.0*   CBG: No results found for this basename: GLUCAP,  in the last 168 hours  No results found for this or any previous visit (from the past 240 hour(s)).   Studies: Dg Chest 2 View  02/21/2013   *RADIOLOGY REPORT*  Clinical Data: Shortness of breath  CHEST - 2 VIEW  Comparison: Chest radiograph 04/11/2012  Findings: Heart size is upper normal in stable.  A left chest wall dual lead pacemaker has leads terminating in the right atrium and right ventricle.  There are changes of median sternotomy for CABG and aortic valve replacement.  Pulmonary vascularity  is mildly prominent.  There is diffuse interstitial prominence.  There are trace bilateral pleural effusions.  Atherosclerotic calcification and ectasia of the abdominal aorta are noted on the lateral view. The bones are diffusely osteopenic.  The bones are markedly osteopenic.  No compression deformities or other acute bony abnormalities are identified.  IMPRESSION:  1.  Mild congestive heart failure pattern.  There is pulmonary vascular congestion with diffuse interstitial prominence, increased from prior chest radiograph, and trace bilateral pleural effusions. 2. Dual lead pacer and prior CABG and valvular replacement.   Original Report Authenticated By: Britta Mccreedy, M.D.    Scheduled Meds: . azelastine  1 spray Each Nare BID  . dofetilide  250 mcg Oral BID  .  ezetimibe  10 mg Oral Daily  . furosemide  40 mg Intravenous BID  . guaiFENesin  1,200 mg Oral BID  . heparin  5,000 Units Subcutaneous Q8H  . levofloxacin  500 mg Oral Daily  . metoprolol tartrate  12.5 mg Oral BID  . pantoprazole  40 mg Oral Daily  . sodium chloride  3 mL Intravenous Q12H  . warfarin  3 mg Oral q1800  . Warfarin - Pharmacist Dosing Inpatient   Does not apply q1800   Continuous Infusions: . sodium chloride       Marinda Elk  [facility] Pager [phone]. If 8PM-8AM, please contact night-coverage at [email or web address], password [facility] 02/22/2013, 11:28 AM  LOS: 1 day

## 2013-02-22 NOTE — Progress Notes (Signed)
  Echocardiogram 2D Echocardiogram has been performed.  [name] 02/22/2013, 1:05 PM

## 2013-02-22 NOTE — Progress Notes (Signed)
Physical Therapy Evaluation Patient Details Name: [name] MRN: [id number] DOB: 1930/01/10 Today's Date: 02/22/2013 Time: 7829-5621 PT Time Calculation (min): 17 min  PT Assessment / Plan / Recommendation Clinical Impression  79 yo female admitted with CHF presents to PT overall doing quite well, at modifiied independent functional level; No PT needs noted at this time; Will sign off    PT Assessment  Patent does not need any further PT services    Follow Up Recommendations  No PT follow up    Does the patient have the potential to tolerate intense rehabilitation      Barriers to Discharge        Equipment Recommendations  None recommended by PT    Recommendations for Other Services     Frequency      Precautions / Restrictions     Pertinent Vitals/Pain no apparent distress Session conducted on Room Air, and pt's O2 sats were greater than or equal to 95%      Mobility  Bed Mobility Bed Mobility: Supine to Sit;Sitting - Scoot to Edge of Bed Supine to Sit: 6: Modified independent (Device/Increase time) Sitting - Scoot to Edge of Bed: 6: Modified independent (Device/Increase time) Transfers Transfers: Sit to Stand;Stand to Sit Sit to Stand: 6: Modified independent (Device/Increase time) Stand to Sit: 6: Modified independent (Device/Increase time) Ambulation/Gait Ambulation/Gait Assistance: 7: Independent Ambulation Distance (Feet): 300 Feet (greater than) Assistive device: None Ambulation/Gait Assistance Details: No loss of balance noted; initial cues to self monitor for activity tolerance or shortness of breath Gait Pattern: Within Functional Limits    Exercises     PT Diagnosis:    PT Problem List:   PT Treatment Interventions:     PT Goals    Visit Information  Last PT Received On: 02/22/13 Assistance Needed: +1    Subjective Data  Subjective: Agreeable to amb; "I live alone, and intend to deep it that way" Patient Stated Goal: feel better and  go home   Prior Functioning  Home Living Lives With: Alone Available Help at Discharge: Family;Friend(s) (check in on pt periodically) Type of Home: House Home Access: Stairs to enter [employer] of Steps: 3 Home Layout: Two level;Able to live on main level with bedroom/bathroom Bathroom Shower/Tub: Walk-in shower Home Adaptive Equipment: None Prior Function Level of Independence: Independent Able to Take Stairs?: Yes Driving: Yes Comments: Very independent Communication Communication: No difficulties    Cognition  Cognition Arousal/Alertness: Awake/alert Behavior During Therapy: WFL for tasks assessed/performed Overall Cognitive Status: Within Functional Limits for tasks assessed    Extremity/Trunk Assessment Right Upper Extremity Assessment RUE ROM/Strength/Tone: Within functional levels Left Upper Extremity Assessment LUE ROM/Strength/Tone: Within functional levels Right Lower Extremity Assessment RLE ROM/Strength/Tone: [facility] for tasks assessed Left Lower Extremity Assessment LLE ROM/Strength/Tone: WFL for tasks assessed   Balance    End of Session PT - End of Session Activity Tolerance: Patient tolerated treatment well Patient left: in bed;with call bell/phone within reach Nurse Communication: Mobility status;Other (comment) (O2 sats remained greater than or equal to 95% on room air)  GP     Olen Pel [place],  [phone]  02/22/2013, 5:02 PM

## 2013-02-22 NOTE — Progress Notes (Addendum)
ANTICOAGULATION CONSULT NOTE - Initial Consult  Pharmacy Consult for coumadin Indication: atrial fibrillation  Allergies  Allergen Reactions  . Amiodarone     unknown  . Diphenhydramine Hcl Other (See Comments)    unknown    Patient Measurements: Height: 5\' 5"  (165.1 cm) Weight: 147 lb 3.2 oz (66.769 kg) IBW/kg (Calculated) : 57  Vital Signs: Temp: 97.7 F (36.5 C) (05/18 0514) Temp src: Oral (05/18 0514) BP: 106/62 mmHg (05/18 0514) Pulse Rate: 77 (05/18 0514)  Labs:  Recent Labs  02/21/13 0913 02/21/13 0933 02/22/13 0505  HGB 10.4* 11.9* 9.7*  HCT 33.5* 35.0* 31.9*  PLT 276  --  252  LABPROT 16.1*  --  17.7*  INR 1.32  --  1.50*  CREATININE  --  0.80 0.85    Estimated Creatinine Clearance: 47.5 ml/min (by C-G formula based on Cr of 0.85).   Medical History: Past Medical History  Diagnosis Date  . Coronary artery disease     Prior stent to RCA in 1996 and 2008; s/p CAB in 2009 with MV repair and Maze  . Atrial fibrillation     in sinus on Tikosyn  . CHF (congestive heart failure)     EF 35% per echo in 2009  . Hypertension   . Anxiety   . Depression   . Hyperlipidemia   . Mitral insufficiency     s/p MV repair 2009  . IDA (iron deficiency anemia)   . Subarachnoid hemorrhage   . LBBB (left bundle branch block)   . Chronic anticoagulation   . High risk medication use     Tikosyn  . Tachycardia-bradycardia     s/p PPM  . Pacemaker     Medications:  Prescriptions prior to admission  Medication Sig Dispense Refill  . azelastine (ASTELIN) 137 MCG/SPRAY nasal spray Place 1 spray into the nose 2 (two) times daily. Use in each nostril as directed      . Cholecalciferol (VITAMIN D) 2000 UNITS tablet Take 2,000 Units by mouth daily.        Marland Kitchen dofetilide (TIKOSYN) 250 MCG capsule Take 1 capsule (250 mcg total) by mouth 2 (two) times daily.  90 capsule  3  . ezetimibe (ZETIA) 10 MG tablet Take 10 mg by mouth daily.      . furosemide (LASIX) 20 MG tablet  Take 1 tablet (20 mg total) by mouth daily.  30 tablet  0  . guaiFENesin (MUCINEX) 600 MG 12 hr tablet Take 1 tablet (600 mg total) by mouth 2 (two) times daily.  14 tablet  0  . LORazepam (ATIVAN) 0.5 MG tablet Take 0.5 mg by mouth every 8 (eight) hours as needed for anxiety.       . metoprolol tartrate (LOPRESSOR) 25 MG tablet Take 12.5 mg by mouth 2 (two) times daily.      . pantoprazole (PROTONIX) 40 MG tablet Take 40 mg by mouth daily.      . potassium chloride (K-DUR) 10 MEQ tablet Take 1 tablet (10 mEq total) by mouth 2 (two) times daily.  30 tablet  0  . warfarin (COUMADIN) 1 MG tablet Take 2-3 mg by mouth daily. 3 mg daily except 2 mg on tuesdays and thursdays       Scheduled:  . azelastine  1 spray Each Nare BID  . dofetilide  250 mcg Oral BID  . ezetimibe  10 mg Oral Daily  . furosemide  40 mg Intravenous BID  . guaiFENesin  1,200 mg Oral  BID  . heparin  5,000 Units Subcutaneous Q8H  . levofloxacin  500 mg Oral Daily  . metoprolol tartrate  12.5 mg Oral BID  . pantoprazole  40 mg Oral Daily  . sodium chloride  3 mL Intravenous Q12H  . Warfarin - Pharmacist Dosing Inpatient   Does not apply q1800    Assessment: 79 yo with hx afib who was admitted for resp distress. She has been on chronic coumadin for afib. INR is trending up today after given a slightly higher dose. On levaquin so there is a potential of interactions  PTA 3mg  qday except 2mg  TTh  Goal of Therapy:  INR 2-3 Monitor platelets by anticoagulation protocol: Yes   Plan:   Coumadin 3mg  qday Daily INR

## 2013-02-23 LAB — BASIC METABOLIC PANEL
BUN: 23 mg/dL (ref 6–23)
CO2: 29 mEq/L (ref 19–32)
Chloride: 101 mEq/L (ref 96–112)
Creatinine, Ser: 1.02 mg/dL (ref 0.50–1.10)
Glucose, Bld: 90 mg/dL (ref 70–99)
Potassium: 3.3 mEq/L — ABNORMAL LOW (ref 3.5–5.1)

## 2013-02-23 MED ORDER — POTASSIUM CHLORIDE CRYS ER 20 MEQ PO TBCR
40.0000 meq | EXTENDED_RELEASE_TABLET | Freq: Two times a day (BID) | ORAL | Status: AC
  Administered 2013-02-23 (×2): 40 meq via ORAL
  Filled 2013-02-23 (×2): qty 2

## 2013-02-23 MED ORDER — FUROSEMIDE 40 MG PO TABS
40.0000 mg | ORAL_TABLET | Freq: Every day | ORAL | Status: DC
  Administered 2013-02-24: 40 mg via ORAL
  Filled 2013-02-23 (×2): qty 1

## 2013-02-23 MED ORDER — WARFARIN SODIUM 5 MG PO TABS
5.0000 mg | ORAL_TABLET | Freq: Once | ORAL | Status: AC
  Administered 2013-02-23: 5 mg via ORAL
  Filled 2013-02-23: qty 1

## 2013-02-23 NOTE — Progress Notes (Addendum)
[facility] PROGRESS NOTE  Assessment/Plan: *Acute on chronic systolic heart failure - 65.6 kg, -4.0L - change lasix to oral, creatinine increasing. B-met in am - Echo 5.18.2014 pending - replete K, b-met in am. - Cont fluid restriction diet and Na.  Acute bronchitis: - afebrile SOB improved, cont levaquin.   Atrial fibrillation - INR sub thw coumadin per pharmacy.    Code Status: Full code  Family Communication: Spoke with patient  Disposition Plan: Inpatient, telemetry, spate length of stay of more than 2 midnights   Consultants:  none  Procedures: Echo 5.18.2014: There is calcification, thinning, and akinesis of the inferolateral wall. The cavity size was normal. Wall thickness was increased in a pattern of mild LVH. The estimated ejection fraction was 35%.     Antibiotics:  Levaquin  HPI/Subjective: Grumpy today. No complains, wants to be left alone.  Objective: Filed Vitals:   02/22/13 1400 02/22/13 1721 02/22/13 2100 02/23/13 0500  BP: 102/47 103/63 108/68 104/60  Pulse: 74  82 74  Temp: 97.5 F (36.4 C)  98.1 F (36.7 C) 98 F (36.7 C)  TempSrc: Oral     Resp: 16  18 18   Height:      Weight:    65.681 kg (144 lb 12.8 oz)  SpO2: 99%  95% 97%    Intake/Output Summary (Last 24 hours) at 02/23/13 1004 Last data filed at 02/23/13 0915  Gross per 24 hour  Intake    600 ml  Output   3550 ml  Net  -2950 ml   Filed Weights   02/21/13 1255 02/22/13 0647 02/23/13 0500  Weight: 67.631 kg (149 lb 1.6 oz) 66.769 kg (147 lb 3.2 oz) 65.681 kg (144 lb 12.8 oz)    Exam:  General: Alert, awake, oriented x3, in no acute distress.  HEENT: No bruits, no goiter. + JVD Heart: Regular rate and rhythm, without murmurs, rubs, gallops.  Lungs: Good air movement, clear to auscultation Abdomen: Soft, nontender, nondistended, positive bowel sounds.  Neuro: Grossly intact, nonfocal.   Data Reviewed: Basic Metabolic Panel:  Recent Labs Lab  02/21/13 0933 02/22/13 0505 02/23/13 0535  NA 141 140 138  K 3.8 4.1 3.3*  CL 108 105 101  CO2  --  28 29  GLUCOSE 127* 87 90  BUN 22 19 23   CREATININE 0.80 0.85 1.02  CALCIUM  --  9.0 9.1   Liver Function Tests: No results found for this basename: AST, ALT, ALKPHOS, BILITOT, PROT, ALBUMIN,  in the last 168 hours No results found for this basename: LIPASE, AMYLASE,  in the last 168 hours No results found for this basename: AMMONIA,  in the last 168 hours CBC:  Recent Labs Lab 02/21/13 0913 02/21/13 0933 02/22/13 0505  WBC 14.9*  --  6.2  NEUTROABS 12.9*  --   --   HGB 10.4* 11.9* 9.7*  HCT 33.5* 35.0* 31.9*  MCV 74.3*  --  74.0*  PLT 276  --  252   Cardiac Enzymes: No results found for this basename: CKTOTAL, CKMB, CKMBINDEX, TROPONINI,  in the last 168 hours BNP (last 3 results)  Recent Labs  04/10/12 0521 02/21/13 0925 02/22/13 0002  PROBNP 1586.0* 1698.0* 2879.0*   CBG: No results found for this basename: GLUCAP,  in the last 168 hours  No results found for this or any previous visit (from the past 240 hour(s)).   Studies: No results found.  Scheduled Meds: . azelastine  1 spray Each Nare BID  . dofetilide  250 mcg Oral BID  . ezetimibe  10 mg Oral Daily  . furosemide  40 mg Intravenous BID  . guaiFENesin  1,200 mg Oral BID  . heparin  5,000 Units Subcutaneous Q8H  . levofloxacin  500 mg Oral Daily  . metoprolol tartrate  12.5 mg Oral BID  . pantoprazole  40 mg Oral Daily  . sodium chloride  3 mL Intravenous Q12H  . warfarin  3 mg Oral q1800  . Warfarin - Pharmacist Dosing Inpatient   Does not apply q1800   Continuous Infusions: . sodium chloride       Marinda Elk  [facility] Pager [phone]. If 8PM-8AM, please contact night-coverage at [email or web address], password [facility] 02/23/2013, 10:04 AM  LOS: 2 days

## 2013-02-23 NOTE — Progress Notes (Signed)
ANTICOAGULATION CONSULT NOTE - Follow Up Consult  Pharmacy Consult for Coumadin Indication: atrial fibrillation  Allergies  Allergen Reactions  . Amiodarone     unknown  . Diphenhydramine Hcl Other (See Comments)    unknown    Patient Measurements: Height: 5\' 5"  (165.1 cm) Weight: 144 lb 12.8 oz (65.681 kg) IBW/kg (Calculated) : 57  Vital Signs: Temp: 98 F (36.7 C) (05/19 0500) BP: 104/60 mmHg (05/19 0500) Pulse Rate: 74 (05/19 0500)  Labs:  Recent Labs  02/21/13 0913 02/21/13 0933 02/22/13 0505 02/23/13 0535  HGB 10.4* 11.9* 9.7*  --   HCT 33.5* 35.0* 31.9*  --   PLT 276  --  252  --   LABPROT 16.1*  --  17.7* 17.6*  INR 1.32  --  1.50* 1.49  CREATININE  --  0.80 0.85 1.02    Estimated Creatinine Clearance: 39.6 ml/min (by C-G formula based on Cr of 1.02).   Medications:  Scheduled:  . azelastine  1 spray Each Nare BID  . dofetilide  250 mcg Oral BID  . ezetimibe  10 mg Oral Daily  . furosemide  40 mg Oral Daily  . guaiFENesin  1,200 mg Oral BID  . heparin  5,000 Units Subcutaneous Q8H  . levofloxacin  500 mg Oral Daily  . metoprolol tartrate  12.5 mg Oral BID  . pantoprazole  40 mg Oral Daily  . potassium chloride  40 mEq Oral BID  . sodium chloride  3 mL Intravenous Q12H  . warfarin  3 mg Oral q1800  . Warfarin - Pharmacist Dosing Inpatient   Does not apply q1800    Assessment: 79 yo F with a hx afib on Coumadin PTA.  Pt admitted 5/17 with resp distress thought 2/2 HF exacerbation vs. bronchitis.  INR remains subtherapeutic, will increase Coumadin dose.  Goal of Therapy:  INR 2-3   Plan:  Coumadin 5mg  PO x 1 tonight. Continue daily INR.  [employer], Pharm.D., BCPS Clinical Pharmacist Pager [phone] 02/23/2013 11:06 AM

## 2013-02-23 NOTE — Progress Notes (Signed)
OT Cancellation Note  Patient Details Name: Kylina Vultaggio MRN: [id number] DOB: [birth year]   Cancelled Treatment:     Pt reports no OT needs at this time. States that she has been ambulating I to/from bathroom w/o assist, has walk in shower w/ seat & other DME. PT eval states that pt is at Mod I/functional level. Pt reports that her dtr is coming into town from [place] initially to assist PRN/intermittantly. Please see PT eval for full details, will sign off OT.  Roselie Awkward Dixon 02/23/2013, 8:08 AM

## 2013-02-23 NOTE — Progress Notes (Signed)
While giving patient's medications, patient seemed sad, when asked patient what was wrong, if she was just tired, she stated "I am just tired of living". When this RN asked her if she was "tired of living enough to do anything to herself?", she stated "Oh no", will advise Dr. David Stall of same, Berle Mull RN

## 2013-02-24 LAB — BASIC METABOLIC PANEL
BUN: 26 mg/dL — ABNORMAL HIGH (ref 6–23)
Chloride: 105 mEq/L (ref 96–112)
Glucose, Bld: 86 mg/dL (ref 70–99)
Potassium: 4.2 mEq/L (ref 3.5–5.1)
Sodium: 140 mEq/L (ref 135–145)

## 2013-02-24 MED ORDER — LEVOFLOXACIN 500 MG PO TABS: 500.0000 mg | ORAL_TABLET | Freq: Every day | ORAL | Status: DC

## 2013-02-24 NOTE — Progress Notes (Signed)
DC orders received.  Patient stable with no S/S of distress.  Medication and discharge information reviewed with patient and patient's son.  Patient DC home. [place], Mitzi Hansen

## 2013-02-24 NOTE — Discharge Summary (Addendum)
Physician Discharge Summary  [name] UJW:[id number] DOB: June 06, 1930 DOA: 02/21/2013  PCP: Maryelizabeth Rowan, MD  Admit date: 02/21/2013 Discharge date: 02/24/2013  Time spent: 35 minutes  Recommendations for Outpatient Follow-up:  1. Follow up with PCP 2 weeks titrate bp MERDS AS TOLERATE IT. 2. FOLLOW UP WITH Cardiology in 1 week. 3.   Discharge Diagnoses:  Principal Problem:   Acute on chronic systolic heart failure Active Problems:   CAD   Atrial fibrillation   Systolic CHF, chronic   Acute bronchitis   Discharge Condition: stable  Diet recommendation: low sodium diet  Filed Weights   02/22/13 0647 02/23/13 0500 02/24/13 0500  Weight: 66.769 kg (147 lb 3.2 oz) 65.681 kg (144 lb 12.8 oz) 65.772 kg (145 lb)    History of present illness:  79 y.o. female with past medical history of CAD, atrial fibrillation and CHF. Patient said she was in her usual state of health till yesterday. She started to have some shortness of breath and cough. She woke up last night because she couldn't breathe, this morning time worth with more cough and whitish sputum so she called EMT and brought to the hospital for further evaluation. Initial evaluation in the emergency department the chest x-ray showed vascular congestion consistent with mild pulmonary edema and leukocytosis of 14.9. She also has BNP of 1700. She denies any chest pain, denies any palpitations.   Hospital Course:  Acute on chronic systolic heart failure  - 65.6 kg, negative > 5.0L  - change lasix to oral, 24 before d/c cr stable. Start ACE as an outpatient due to low Blood pressure. - Echo 5.18.2014: ef 35 %. - Cont fluid restriction diet and Na at home. - Able to sleep upon discharge.  Acute bronchitis:  - afebrile SOB improved, cont levaquin.   Atrial fibrillation  - INR sub thw coumadin per pharmacy. - follow up with coumadin clinic.   Procedures:  Echo 5.18.2014  Consultations:  none  Discharge  Exam: Filed Vitals:   02/23/13 1403 02/23/13 2100 02/23/13 2157 02/24/13 0500  BP: 93/62 109/67 109/67 100/60  Pulse: 79 79 79 74  Temp: 97.9 F (36.6 C) 98 F (36.7 C)  98.2 F (36.8 C)  TempSrc:      Resp:  20  18  Height:      Weight:    65.772 kg (145 lb)  SpO2: 96% 96%  97%    General: A&O x3 Cardiovascular: RRR Respiratory: good air movement CTA B/L  Discharge Instructions  Discharge Orders   Future Appointments Provider Department Dept Phone   03/09/2013 11:00 AM [facility] [phone]   Future Orders Complete By Expires     Diet - low sodium heart healthy  As directed     Increase activity slowly  As directed         Medication List    TAKE these medications       azelastine 137 MCG/SPRAY nasal spray  Commonly known as:  ASTELIN  Place 1 spray into the nose 2 (two) times daily. Use in each nostril as directed     dofetilide 250 MCG capsule  Commonly known as:  TIKOSYN  Take 1 capsule (250 mcg total) by mouth 2 (two) times daily.     ezetimibe 10 MG tablet  Commonly known as:  ZETIA  Take 10 mg by mouth daily.     furosemide 20 MG tablet  Commonly known as:  LASIX  Take 1 tablet (20 mg  total) by mouth daily.     guaiFENesin 600 MG 12 hr tablet  Commonly known as:  MUCINEX  Take 1 tablet (600 mg total) by mouth 2 (two) times daily.     levofloxacin 500 MG tablet  Commonly known as:  LEVAQUIN  Take 1 tablet (500 mg total) by mouth daily.     LORazepam 0.5 MG tablet  Commonly known as:  ATIVAN  Take 0.5 mg by mouth every 8 (eight) hours as needed for anxiety.     metoprolol tartrate 25 MG tablet  Commonly known as:  LOPRESSOR  Take 12.5 mg by mouth 2 (two) times daily.     pantoprazole 40 MG tablet  Commonly known as:  PROTONIX  Take 40 mg by mouth daily.     potassium chloride 10 MEQ tablet  Commonly known as:  K-DUR  Take 1 tablet (10 mEq total) by mouth 2 (two) times daily.     Vitamin D  2000 UNITS tablet  Take 2,000 Units by mouth daily.     warfarin 1 MG tablet  Commonly known as:  COUMADIN  Take 2-3 mg by mouth daily. 3 mg daily except 2 mg on tuesdays and thursdays       Allergies  Allergen Reactions  . Amiodarone     unknown  . Diphenhydramine Hcl Other (See Comments)    unknown       Follow-up Information   Follow up with [facility], MD In 3 weeks. (hospital follow up)    Contact information:   [address] [place] [address] [phone]       Follow up with Peter [place], MD In 1 week. (hospital follow up)    Contact information:   [address] [place] [address] [phone]        The results of significant diagnostics from this hospitalization (including imaging, microbiology, ancillary and laboratory) are listed below for reference.    Significant Diagnostic Studies: Dg Chest 2 View  02/21/2013   *RADIOLOGY REPORT*  Clinical Data: Shortness of breath  CHEST - 2 VIEW  Comparison: Chest radiograph 04/11/2012  Findings: Heart size is upper normal in stable.  A left chest wall dual lead pacemaker has leads terminating in the right atrium and right ventricle.  There are changes of median sternotomy for CABG and aortic valve replacement.  Pulmonary vascularity is mildly prominent.  There is diffuse interstitial prominence.  There are trace bilateral pleural effusions.  Atherosclerotic calcification and ectasia of the abdominal aorta are noted on the lateral view. The bones are diffusely osteopenic.  The bones are markedly osteopenic.  No compression deformities or other acute bony abnormalities are identified.  IMPRESSION:  1.  Mild congestive heart failure pattern.  There is pulmonary vascular congestion with diffuse interstitial prominence, increased from prior chest radiograph, and trace bilateral pleural effusions. 2. Dual lead pacer and prior CABG and valvular replacement.   Original Report Authenticated By:  Britta Mccreedy, M.D.    Microbiology: No results found for this or any previous visit (from the past 240 hour(s)).   Labs: Basic Metabolic Panel:  Recent Labs Lab 02/21/13 0933 02/22/13 0505 02/23/13 0535 02/24/13 0525  NA 141 140 138 140  K 3.8 4.1 3.3* 4.2  CL 108 105 101 105  CO2  --  28 29 27   GLUCOSE 127* 87 90 86  BUN 22 19 23  26*  CREATININE 0.80 0.85 1.02 0.95  CALCIUM  --  9.0 9.1  9.3   Liver Function Tests: No results found for this basename: AST, ALT, ALKPHOS, BILITOT, PROT, ALBUMIN,  in the last 168 hours No results found for this basename: LIPASE, AMYLASE,  in the last 168 hours No results found for this basename: AMMONIA,  in the last 168 hours CBC:  Recent Labs Lab 02/21/13 0913 02/21/13 0933 02/22/13 0505  WBC 14.9*  --  6.2  NEUTROABS 12.9*  --   --   HGB 10.4* 11.9* 9.7*  HCT 33.5* 35.0* 31.9*  MCV 74.3*  --  74.0*  PLT 276  --  252   Cardiac Enzymes: No results found for this basename: CKTOTAL, CKMB, CKMBINDEX, TROPONINI,  in the last 168 hours BNP: BNP (last 3 results)  Recent Labs  04/10/12 0521 02/21/13 0925 02/22/13 0002  PROBNP 1586.0* 1698.0* 2879.0*   CBG: No results found for this basename: GLUCAP,  in the last 168 hours     Signed:  Marinda Elk  [facility] 02/24/2013, 8:35 AM

## 2013-03-09 ENCOUNTER — Ambulatory Visit (INDEPENDENT_AMBULATORY_CARE_PROVIDER_SITE_OTHER): Payer: Medicare Other | Admitting: Pharmacist

## 2013-03-09 DIAGNOSIS — I4891 Unspecified atrial fibrillation: Secondary | ICD-10-CM

## 2013-03-09 DIAGNOSIS — Z7901 Long term (current) use of anticoagulants: Secondary | ICD-10-CM

## 2013-03-23 ENCOUNTER — Ambulatory Visit (INDEPENDENT_AMBULATORY_CARE_PROVIDER_SITE_OTHER): Payer: Medicare Other

## 2013-03-23 DIAGNOSIS — Z7901 Long term (current) use of anticoagulants: Secondary | ICD-10-CM

## 2013-03-23 DIAGNOSIS — I4891 Unspecified atrial fibrillation: Secondary | ICD-10-CM

## 2013-03-23 LAB — POCT INR: INR: 2.2

## 2013-04-13 ENCOUNTER — Ambulatory Visit (INDEPENDENT_AMBULATORY_CARE_PROVIDER_SITE_OTHER): Payer: Medicare Other | Admitting: *Deleted

## 2013-04-13 DIAGNOSIS — Z7901 Long term (current) use of anticoagulants: Secondary | ICD-10-CM

## 2013-04-13 DIAGNOSIS — I4891 Unspecified atrial fibrillation: Secondary | ICD-10-CM

## 2013-04-13 LAB — POCT INR: INR: 1.8

## 2013-05-04 ENCOUNTER — Ambulatory Visit (INDEPENDENT_AMBULATORY_CARE_PROVIDER_SITE_OTHER): Payer: Medicare Other | Admitting: *Deleted

## 2013-05-04 DIAGNOSIS — I4891 Unspecified atrial fibrillation: Secondary | ICD-10-CM

## 2013-05-04 DIAGNOSIS — Z7901 Long term (current) use of anticoagulants: Secondary | ICD-10-CM

## 2013-05-04 LAB — POCT INR: INR: 1.9

## 2013-05-08 ENCOUNTER — Other Ambulatory Visit: Payer: Self-pay | Admitting: Cardiology

## 2013-05-22 ENCOUNTER — Telehealth: Payer: Self-pay | Admitting: Internal Medicine

## 2013-05-22 NOTE — Telephone Encounter (Signed)
Called and spoke with pt and confirmed her appt with [place] for 05/25/2013 at 10:15am and she stated understanding.

## 2013-05-22 NOTE — Telephone Encounter (Signed)
New Prob    Pt called saying she was supposed to have an appt scheduled for 8/18 but was never scheduled. Pt calling to verify she is supposed to come in. Please call.

## 2013-05-25 ENCOUNTER — Ambulatory Visit (INDEPENDENT_AMBULATORY_CARE_PROVIDER_SITE_OTHER): Payer: Medicare Other | Admitting: *Deleted

## 2013-05-25 DIAGNOSIS — Z7901 Long term (current) use of anticoagulants: Secondary | ICD-10-CM

## 2013-05-25 DIAGNOSIS — I4891 Unspecified atrial fibrillation: Secondary | ICD-10-CM

## 2013-06-17 ENCOUNTER — Ambulatory Visit (INDEPENDENT_AMBULATORY_CARE_PROVIDER_SITE_OTHER): Payer: Medicare Other | Admitting: Pharmacist

## 2013-06-17 ENCOUNTER — Ambulatory Visit (INDEPENDENT_AMBULATORY_CARE_PROVIDER_SITE_OTHER): Payer: Medicare Other | Admitting: *Deleted

## 2013-06-17 DIAGNOSIS — I4891 Unspecified atrial fibrillation: Secondary | ICD-10-CM

## 2013-06-17 DIAGNOSIS — Z7901 Long term (current) use of anticoagulants: Secondary | ICD-10-CM

## 2013-06-17 LAB — PACEMAKER DEVICE OBSERVATION
AL IMPEDENCE PM: 352 Ohm
AL THRESHOLD: 2.5 V
ATRIAL PACING PM: 0.63
BATTERY VOLTAGE: 2.95 V
RV LEAD AMPLITUDE: 20.2664 mv
RV LEAD IMPEDENCE PM: 536 Ohm

## 2013-06-17 LAB — POCT INR: INR: 2.5

## 2013-06-17 NOTE — Progress Notes (Signed)
Device check in clinic, all functions normal, no changes made, full details in PaceArt.  1 NSVT---19 beats.  ROV w/ Dr. Johney Frame in 60mo.

## 2013-07-01 ENCOUNTER — Ambulatory Visit (INDEPENDENT_AMBULATORY_CARE_PROVIDER_SITE_OTHER): Payer: Medicare Other | Admitting: *Deleted

## 2013-07-01 DIAGNOSIS — I4891 Unspecified atrial fibrillation: Secondary | ICD-10-CM

## 2013-07-01 DIAGNOSIS — Z7901 Long term (current) use of anticoagulants: Secondary | ICD-10-CM

## 2013-07-01 LAB — POCT INR: INR: 2.5

## 2013-07-13 ENCOUNTER — Encounter: Payer: Self-pay | Admitting: Internal Medicine

## 2013-07-29 ENCOUNTER — Ambulatory Visit (INDEPENDENT_AMBULATORY_CARE_PROVIDER_SITE_OTHER): Payer: Medicare Other | Admitting: *Deleted

## 2013-07-29 DIAGNOSIS — Z23 Encounter for immunization: Secondary | ICD-10-CM

## 2013-07-29 DIAGNOSIS — I4891 Unspecified atrial fibrillation: Secondary | ICD-10-CM

## 2013-07-29 DIAGNOSIS — Z7901 Long term (current) use of anticoagulants: Secondary | ICD-10-CM

## 2013-07-29 LAB — POCT INR: INR: 2.3

## 2013-08-04 ENCOUNTER — Other Ambulatory Visit: Payer: Self-pay | Admitting: Cardiology

## 2013-08-04 ENCOUNTER — Other Ambulatory Visit: Payer: Self-pay | Admitting: Internal Medicine

## 2013-08-10 ENCOUNTER — Inpatient Hospital Stay (HOSPITAL_COMMUNITY)
Admission: EM | Admit: 2013-08-10 | Discharge: 2013-08-12 | DRG: 293 | Disposition: A | Discharge status: Home / Self Care | Payer: Medicare Other | Attending: Internal Medicine | Admitting: Internal Medicine

## 2013-08-10 ENCOUNTER — Emergency Department (HOSPITAL_COMMUNITY): Payer: Medicare Other

## 2013-08-10 ENCOUNTER — Encounter (HOSPITAL_COMMUNITY): Payer: Self-pay | Admitting: Emergency Medicine

## 2013-08-10 DIAGNOSIS — I495 Sick sinus syndrome: Secondary | ICD-10-CM | POA: Diagnosis present

## 2013-08-10 DIAGNOSIS — Z7901 Long term (current) use of anticoagulants: Secondary | ICD-10-CM

## 2013-08-10 DIAGNOSIS — I2789 Other specified pulmonary heart diseases: Secondary | ICD-10-CM | POA: Diagnosis present

## 2013-08-10 DIAGNOSIS — M810 Age-related osteoporosis without current pathological fracture: Secondary | ICD-10-CM | POA: Diagnosis present

## 2013-08-10 DIAGNOSIS — F3289 Other specified depressive episodes: Secondary | ICD-10-CM | POA: Diagnosis present

## 2013-08-10 DIAGNOSIS — Z954 Presence of other heart-valve replacement: Secondary | ICD-10-CM

## 2013-08-10 DIAGNOSIS — I10 Essential (primary) hypertension: Secondary | ICD-10-CM | POA: Diagnosis present

## 2013-08-10 DIAGNOSIS — Z87891 Personal history of nicotine dependence: Secondary | ICD-10-CM

## 2013-08-10 DIAGNOSIS — E7800 Pure hypercholesterolemia, unspecified: Secondary | ICD-10-CM | POA: Diagnosis present

## 2013-08-10 DIAGNOSIS — D509 Iron deficiency anemia, unspecified: Secondary | ICD-10-CM

## 2013-08-10 DIAGNOSIS — I2510 Atherosclerotic heart disease of native coronary artery without angina pectoris: Secondary | ICD-10-CM | POA: Diagnosis present

## 2013-08-10 DIAGNOSIS — F329 Major depressive disorder, single episode, unspecified: Secondary | ICD-10-CM | POA: Diagnosis present

## 2013-08-10 DIAGNOSIS — I509 Heart failure, unspecified: Secondary | ICD-10-CM | POA: Diagnosis present

## 2013-08-10 DIAGNOSIS — I059 Rheumatic mitral valve disease, unspecified: Secondary | ICD-10-CM | POA: Diagnosis present

## 2013-08-10 DIAGNOSIS — I5023 Acute on chronic systolic (congestive) heart failure: Principal | ICD-10-CM | POA: Diagnosis present

## 2013-08-10 DIAGNOSIS — D649 Anemia, unspecified: Secondary | ICD-10-CM

## 2013-08-10 DIAGNOSIS — Z951 Presence of aortocoronary bypass graft: Secondary | ICD-10-CM

## 2013-08-10 DIAGNOSIS — E785 Hyperlipidemia, unspecified: Secondary | ICD-10-CM | POA: Diagnosis present

## 2013-08-10 DIAGNOSIS — I4891 Unspecified atrial fibrillation: Secondary | ICD-10-CM | POA: Diagnosis present

## 2013-08-10 DIAGNOSIS — Z950 Presence of cardiac pacemaker: Secondary | ICD-10-CM

## 2013-08-10 HISTORY — DX: Chronic systolic (congestive) heart failure: I50.22

## 2013-08-10 LAB — IRON AND TIBC
Saturation Ratios: 3 % — ABNORMAL LOW (ref 20–55)
UIBC: 368 ug/dL (ref 125–400)

## 2013-08-10 LAB — RETICULOCYTES
RBC.: 3.52 MIL/uL — ABNORMAL LOW (ref 3.87–5.11)
Retic Count, Absolute: 66.9 10*3/uL (ref 19.0–186.0)
Retic Ct Pct: 1.9 % (ref 0.4–3.1)

## 2013-08-10 LAB — BASIC METABOLIC PANEL
BUN: 15 mg/dL (ref 6–23)
CO2: 21 mEq/L (ref 19–32)
GFR calc non Af Amer: 78 mL/min — ABNORMAL LOW (ref >90)
Glucose, Bld: 103 mg/dL — ABNORMAL HIGH (ref 70–99)
Potassium: 3.9 mEq/L (ref 3.5–5.1)
Sodium: 139 mEq/L (ref 135–145)

## 2013-08-10 LAB — POCT I-STAT TROPONIN I: Troponin i, poc: 0 ng/mL (ref 0.00–0.08)

## 2013-08-10 LAB — CBC
HCT: 24.8 % — ABNORMAL LOW (ref 36.0–46.0)
Hemoglobin: 7.3 g/dL — ABNORMAL LOW (ref 12.0–15.0)
MCHC: 29.4 g/dL — ABNORMAL LOW (ref 30.0–36.0)
RBC: 3.59 MIL/uL — ABNORMAL LOW (ref 3.87–5.11)

## 2013-08-10 LAB — PROTIME-INR
INR: 2.14 — ABNORMAL HIGH (ref 0.00–1.49)
Prothrombin Time: 23.2 seconds — ABNORMAL HIGH (ref 11.6–15.2)

## 2013-08-10 MED ORDER — VITAMIN D3 25 MCG (1000 UNIT) PO TABS
2000.0000 [IU] | ORAL_TABLET | Freq: Every day | ORAL | Status: DC
  Administered 2013-08-11 – 2013-08-12 (×2): 2000 [IU] via ORAL
  Filled 2013-08-10 (×2): qty 2

## 2013-08-10 MED ORDER — FUROSEMIDE 10 MG/ML IJ SOLN
40.0000 mg | Freq: Once | INTRAMUSCULAR | Status: AC
  Administered 2013-08-10: 40 mg via INTRAVENOUS
  Filled 2013-08-10: qty 4

## 2013-08-10 MED ORDER — PANTOPRAZOLE SODIUM 40 MG PO TBEC
40.0000 mg | DELAYED_RELEASE_TABLET | Freq: Every day | ORAL | Status: DC
  Administered 2013-08-10 – 2013-08-12 (×3): 40 mg via ORAL
  Filled 2013-08-10 (×3): qty 1

## 2013-08-10 MED ORDER — ACETAMINOPHEN 325 MG PO TABS
650.0000 mg | ORAL_TABLET | Freq: Once | ORAL | Status: AC
  Administered 2013-08-10: 21:00:00 650 mg via ORAL
  Filled 2013-08-10: qty 2

## 2013-08-10 MED ORDER — FERUMOXYTOL INJECTION 510 MG/17 ML
510.0000 mg | Freq: Once | INTRAVENOUS | Status: AC
  Administered 2013-08-10: 510 mg via INTRAVENOUS
  Filled 2013-08-10: qty 17

## 2013-08-10 MED ORDER — EZETIMIBE 10 MG PO TABS
10.0000 mg | ORAL_TABLET | Freq: Every day | ORAL | Status: DC
  Administered 2013-08-11 – 2013-08-12 (×2): 10 mg via ORAL
  Filled 2013-08-10 (×2): qty 1

## 2013-08-10 MED ORDER — FERROUS SULFATE 325 (65 FE) MG PO TABS
325.0000 mg | ORAL_TABLET | Freq: Every day | ORAL | Status: AC
  Administered 2013-08-11: 07:00:00 325 mg via ORAL
  Filled 2013-08-10: qty 1

## 2013-08-10 MED ORDER — POTASSIUM CHLORIDE CRYS ER 20 MEQ PO TBCR
40.0000 meq | EXTENDED_RELEASE_TABLET | Freq: Once | ORAL | Status: AC
  Administered 2013-08-10: 40 meq via ORAL
  Filled 2013-08-10: qty 2

## 2013-08-10 MED ORDER — ONDANSETRON HCL 4 MG/2ML IJ SOLN: 4.0000 mg | Freq: Four times a day (QID) | INTRAMUSCULAR | Status: DC | PRN

## 2013-08-10 MED ORDER — AZELASTINE HCL 0.1 % NA SOLN
1.0000 | Freq: Two times a day (BID) | NASAL | Status: DC
  Administered 2013-08-10 – 2013-08-12 (×4): 1 via NASAL
  Filled 2013-08-10 (×2): qty 30

## 2013-08-10 MED ORDER — VITAMIN D 50 MCG (2000 UT) PO TABS: 2000.0000 [IU] | ORAL_TABLET | Freq: Every day | ORAL | Status: DC

## 2013-08-10 MED ORDER — ACETAMINOPHEN 325 MG PO TABS: 650.0000 mg | ORAL_TABLET | ORAL | Status: DC | PRN

## 2013-08-10 MED ORDER — ZOLPIDEM TARTRATE 5 MG PO TABS
5.0000 mg | ORAL_TABLET | Freq: Every day | ORAL | Status: DC
  Administered 2013-08-10 – 2013-08-11 (×2): 5 mg via ORAL
  Filled 2013-08-10 (×2): qty 1

## 2013-08-10 MED ORDER — METOPROLOL TARTRATE 12.5 MG HALF TABLET
12.5000 mg | ORAL_TABLET | Freq: Two times a day (BID) | ORAL | Status: DC
  Administered 2013-08-10 – 2013-08-11 (×3): 12.5 mg via ORAL
  Filled 2013-08-10 (×6): qty 1

## 2013-08-10 NOTE — ED Notes (Signed)
Pt provided coke and crackers. Meal tray ordered.

## 2013-08-10 NOTE — H&P (Signed)
Patient ID: Lesta Limbert MRN: [id number], DOB/AGE: Feb 16, 1930   Admit date: 08/10/2013   Primary Physician: Maryelizabeth Rowan, MD Primary Cardiologist: Dr. [place]   Pt. Profile:  [name] is an 79 y.o. female with a history of CAD (s/p multiple stents, CABG x2 w/ mitral valve repair and Maze in 2009), AFIB in sinus on Tikosyn and coumadin, chronic systolic CHF with LV EF 35% (ECHO 02/2013), HTN, hyperlipidemia, MR, tachy-brady syndrome and depression being evaluated today for chest pressure x 2 days accompanied by SOB and cough productive of whitish and clear sputum.  [name] was recently admitted in 02/2013 for acute on chronic heart failure. She reports the symptoms as the same as that admission; however, she feels like there is "water filling up in her breasts" this time. SOB is worse with exertion and improved with rest. She denies orthopnea, PND, palpitations, pre syncope or syncope, fever, chills, N/V/D, abd pain, bleeding or bloody stools. She has not consumed more salt than usual. Patient does not monitor her weights although she feels like her pants fit her more tightly in the waist. Denies abdominal pain.  No change in bowel movements  Does not look at stool Patient is extremely depressed and lonely; she is very vocal about wanting to die and not wanting any invasive interventions.     Problem List  Past Medical History  Diagnosis Date  . Coronary artery disease     a. 1996 s/p PS BMS;  b. 03/2007 NSTEMI/PCI: 3.0x24 and 3.0x28 Taxus DES' t RCA; c. 05/2008 CABG x 2 with MV repair and Maze (LIMA->LAD, VG->PDA)  . Atrial fibrillation     a. in sinus on Tikosyn and coumadin;  b. s/p MAZE @ time of MVR/CABG 05/2008;  c. h/o pulm toxicity while on amio.  . Chronic systolic CHF (congestive heart failure)     a. 02/2013 Echo: EF 35% inflat AK, mod to sev calcified MV annulus w/o MS, mod dil LA, PASP .  Marland Kitchen Hypertension   . Anxiety   . Depression   . Hyperlipidemia   .  Mitral insufficiency     a. 05/2008 s/p MV repair with [employer] MV ring (model 5200, 26mm ser # [id number].  Marland Kitchen IDA (iron deficiency anemia)   . Subarachnoid hemorrhage     a. in setting of syncope/fall/digoxin toxicity 11/2007.  Marland Kitchen LBBB (left bundle branch block)   . Chronic anticoagulation     coumadin  . High risk medication use     Tikosyn  . Tachycardia-bradycardia     a. 05/2008 s/p MDT [id number] Enrhythm DC PPM, ser # [id number] H.    Past Surgical History  Procedure Laterality Date  . Cardiac catheterization  03/19/2007    EF 45%. THERE IS 2+ MITRAL INSUFFICIENCY. THERE IS MODERATE INFERIOR WALL HYPOKINESIA WITH OVERALL MILD TO MODERATE LV DYSFUNCTION  . Coronary stent placement  1996, AND 03/2007    RIGHT CORONARY  . Removal of colon polyp      AND A LARGE CECAL POLYP  . Coronary artery bypass graft  04/2008    INCLUDING A MITRAL VALVE REPAIR AND A LEFT SIDED MAZE PROCEDURE  . Coronary artery bypass graft      X2 with LIMA to LAD and SVG to PD and WITH A LEFT SIDED MAZE PROCEDURE & MITRAL VALVE REPAIR. THIS INCLUDED AN LIMA GRAFT TO THE LAD, AND A SAPHENOUS VEIN GRAFT TO THE PDA.  Marland Kitchen Transthoracic echocardiogram  06/21/2008    EF 35%  . Pacemaker  insertion  05/19/2008    MDT EnRhythm implanted for mobitz II AV block by Dr Reyes Ivan  . Mitral valve repair       Allergies  Allergies  Allergen Reactions  . Amiodarone     unknown  . Diphenhydramine Hcl Other (See Comments)    unknown    Home Medications  Prior to Admission medications   Medication Sig Start Date End Date Taking? Authorizing Provider  azelastine (ASTELIN) 137 MCG/SPRAY nasal spray Place 1 spray into the nose 2 (two) times daily. Use in each nostril as directed    Historical Provider, MD  Cholecalciferol (VITAMIN D) 2000 UNITS tablet Take 2,000 Units by mouth daily.      Historical Provider, MD  ezetimibe (ZETIA) 10 MG tablet Take 10 mg by mouth daily.    Historical Provider, MD  furosemide (LASIX) 20 MG  tablet Take 1 tablet (20 mg total) by mouth daily. 04/11/12 04/11/13  Laveda Norman, MD  levofloxacin (LEVAQUIN) 500 MG tablet Take 1 tablet (500 mg total) by mouth daily. 02/24/13   Marinda Elk, MD  LORazepam (ATIVAN) 0.5 MG tablet Take 0.5 mg by mouth every 8 (eight) hours as needed for anxiety.     Historical Provider, MD  metoprolol tartrate (LOPRESSOR) 25 MG tablet Take 12.5 mg by mouth 2 (two) times daily.    Historical Provider, MD  metoprolol tartrate (LOPRESSOR) 25 MG tablet TAKE 1/2 TABLET BY MOUTH TWICE A DAY 08/04/13   Peter M Sw[place], MD  pantoprazole (PROTONIX) 40 MG tablet Take 40 mg by mouth daily.    Historical Provider, MD  pantoprazole (PROTONIX) 40 MG tablet TAKE 1 TABLET BY MOUTH EVERY DAY 08/04/13   Peter M [place], MD  potassium chloride (K-DUR) 10 MEQ tablet Take 1 tablet (10 mEq total) by mouth 2 (two) times daily. 04/11/12 04/11/13  Laveda Norman, MD  TIKOSYN 250 MCG capsule TAKE ONE CAPSULE BY MOUTH TWICE A DAY 08/04/13   Hillis Range, MD  warfarin (COUMADIN) 1 MG tablet Take 2-3 mg by mouth daily. 3 mg daily except 4 mg on Monday and thursdays    Historical Provider, MD  warfarin (COUMADIN) 1 MG tablet TAKE AS DIRECTED BY ANTICOAG CLINIC 05/08/13   Peter M [place], MD  ZETIA 10 MG tablet TAKE 1 TABLET BY MOUTH EVERY DAY 08/04/13   Peter M [place], MD    Family History  Family History  Problem Relation Age of Onset  . Heart attack Mother   . Cancer Father     Social History  History   Social History  . Marital Status: Married    Spouse Name: N/A    Number of Children: N/A  . Years of Education: N/A   Occupational History  . Not on file.   Social History Main Topics  . Smoking status: Former Smoker    Quit date: 05/06/1996  . Smokeless tobacco: Never Used  . Alcohol Use: No  . Drug Use: No  . Sexual Activity: No   Other Topics Concern  . Not on file   Social History Narrative  . No narrative on file     All other systems reviewed and are otherwise  negative except as noted above.  Physical Exam  Blood pressure 119/43, pulse 77, temperature 98.3 F (36.8 C), temperature source Oral, resp. rate 22, height 5\' 5"  (1.651 m), weight 143 lb (64.864 kg), SpO2 96.00%.  General: Patient is seen with her son. She is ornery and is very vocal about  wanting to die.   In NAD Psych: Normal affect. Neuro: Alert and oriented X 3. Moves all extremities spontaneously. HEENT: Normal  Neck: Supple without bruits + JVD 9cm Lungs:  Rales at based. Heart: RRR S1/ S2  No S3 or S4.  II/VI systolic murmur at apex.   Abdomen: Soft, non-tender, non-distended, BS + x 4. No hepatomegaly    Extremities: No clubbing, cyanosis or edema. DP/PT/Radials 2+ and equal bilaterally.  Labs   Lab Results  Component Value Date   WBC 6.6 08/10/2013   HGB 7.3* 08/10/2013   HCT 24.8* 08/10/2013   MCV 69.1* 08/10/2013   PLT 355 08/10/2013    Recent Labs Lab 08/10/13 1119  NA 139  K 3.9  CL 107  CO2 21  BUN 15  CREATININE 0.72  CALCIUM 8.7  GLUCOSE 103*   BNP: 1458  Radiology/Studies  Dg Chest 2 View  08/10/2013  FINDINGS: There is underlying emphysema. Heart is mildly enlarged with pulmonary venous hypertension. There is trace edema. There is no airspace consolidation.  Patient is status post mitral valve replacement. Pacemaker leads are attached to the right atrium and right ventricle. Patient is status post coronary artery bypass grafting. The bones are osteoporotic.  IMPRESSION: The evidence of a degree of congestive heart failure superimposed on emphysematous change. No edema or consolidation.    ECG: 08/10/13  SR 80 with occasional atrial paced beats.  LBBB  ASSESSMENT AND PLAN  [name] is a 79 y.o. female with a history of CAD s/p multiple stents, CABG x2 w/ mitral valve repair and Maze in 2009, AFIB in sinus on Tikosyn and coumadin and rhythm, chronic CHF: EF 35% (ECHO 02/2013), HTN, hyperlipidemia, MR, tachy-brady syndrome and depression who  presented with chest pressure x2 days (fullness in breasts) and worsening SOB.  1. Acute on chronic systolic heart failure: BNP 1458, CXR and physical exam with stigmata of HF. 40mg  IV lasix and KCl . Recheck CBC, BMET tomorrow.  2. Anemia (microcytic hypochromic): Hg 7.3, down from previous admission in May (9.7). Suspect GI bleed. Pateint has refused work up by GI.  Ordered guaiac card, 1 unit PRBCs, Feraheme, ferrous sulfate 325 mg daily. Patient refused GI consult.   3. Afib: continue Tikosyn. Discontinue coumadin secondary to anemia and suspected GI bleed.   4. Depression: Patient is extremely depressed and lonely; she is very vocal about wanting to die and not wanting any invasive interventions. Discuss case management consult for depression.  SignedThereasa Parkin, PA-C 08/10/2013, 3:05 PM   Patient seen and examined.  I have amended note to reflect my findings. Patient is an 79 yo with CAD and systolic CHF.  Now with CHF exacerbation and found to be profoundly anemic  Appears Fe deficient.  Denies change in bowel movements Anemia may be driving force for worsing CHF   She refuses GI work up  "I do not want to live any longer"  She does agree to admission for diuresis and Tx of 1 U pRBC.  I would also recomm 1 U Ferraheme  She does not want to take po Fe dose at strength that would make her constipated. Would diurese with lasix as needed.  Given above she should no longer be on coumadin  Will d/c  She realizes there is some stroke risk  "I don't want to have a stroke" but does not want any other work up   It does not appear that she is on aspirin  Dietrich Pates

## 2013-08-10 NOTE — ED Provider Notes (Signed)
Medical screening examination/treatment/procedure(s) were conducted as a shared visit with non-physician practitioner(s) and myself.  I personally evaluated the patient during the encounter.  79 yo with Afib, CHF, CAD, and Anemia.  Admit for continued evaluation and treatment.  VSS in ER.  Pt admitted in stable condition.  No issues during ER stay.  The patient appears reasonably stabilized for admission considering the current resources, flow, and capabilities available in the ED at this time, and I doubt any other [facility] requiring further screening and/or treatment in the ED prior to admission.   Darlys Gales, MD 08/10/13 [phone]

## 2013-08-10 NOTE — ED Notes (Signed)
Per EMS- pt reports she woke up this morning feeling stuffy, "I felt like I had a lot of water in my breasts". Denies any "CP". Skin is warm and dry. Has as pacemaker in place, LBBB. She gave herself 324 aspirin. BP 153/45, HR 85, 100% 2L. Has 22 gauge to left hand.

## 2013-08-10 NOTE — ED Notes (Signed)
Pt is eating dinner, phlebotomy is at bedside drawing labs. Then will transport patient up to the floor.

## 2013-08-10 NOTE — ED Notes (Signed)
Cardiology at bedside.

## 2013-08-10 NOTE — ED Provider Notes (Signed)
CSN: [id number]     Arrival date & time 08/10/13  1041 History   First MD Initiated Contact with Patient 08/10/13 1053     Chief Complaint  Patient presents with  . Shortness of Breath   (Consider location/radiation/quality/duration/timing/severity/associated sxs/prior Treatment) HPI Comments: 79 yo female with hx of CAD, CHF, HTN, depression, hyperlipidemia, on chronic anticoagulation (coumadin) presents with c/o chest pressure starting over past 2 days accompanied by shortness of breath and cough productive of whitish and clear sputum. Symptoms worse with exertion and improved with rest. Denies fever, chills, N/V/D, abd pain, lower extremity edema, bloody stools, bleeding.  The history is provided by the patient.    Past Medical History  Diagnosis Date  . Coronary artery disease     Prior stent to RCA in 1996 and 2008; s/p CAB in 2009 with MV repair and Maze  . Atrial fibrillation     in sinus on Tikosyn  . CHF (congestive heart failure)     EF 35% per echo in 2009  . Hypertension   . Anxiety   . Depression   . Hyperlipidemia   . Mitral insufficiency     s/p MV repair 2009  . IDA (iron deficiency anemia)   . Subarachnoid hemorrhage   . LBBB (left bundle branch block)   . Chronic anticoagulation   . High risk medication use     Tikosyn  . Tachycardia-bradycardia     s/p PPM  . Pacemaker    Past Surgical History  Procedure Laterality Date  . Cardiac catheterization  03/19/2007    EF 45%. THERE IS 2+ MITRAL INSUFFICIENCY. THERE IS MODERATE INFERIOR WALL HYPOKINESIA WITH OVERALL MILD TO MODERATE LV DYSFUNCTION  . Coronary stent placement  1996, AND 03/2007    RIGHT CORONARY  . Removal of colon polyp      AND A LARGE CECAL POLYP  . Coronary artery bypass graft  04/2008    INCLUDING A MITRAL VALVE REPAIR AND A LEFT SIDED MAZE PROCEDURE  . Coronary artery bypass graft      X2 with LIMA to LAD and SVG to PD and WITH A LEFT SIDED MAZE PROCEDURE & MITRAL VALVE REPAIR. THIS  INCLUDED AN LIMA GRAFT TO THE LAD, AND A SAPHENOUS VEIN GRAFT TO THE PDA.  Marland Kitchen Transthoracic echocardiogram  06/21/2008    EF 35%  . Pacemaker insertion  05/19/2008    MDT EnRhythm implanted for mobitz II AV block by Dr Reyes Ivan  . Mitral valve repair     Family History  Problem Relation Age of Onset  . Heart attack Mother   . Cancer Father    History  Substance Use Topics  . Smoking status: Former Smoker    Quit date: 05/06/1996  . Smokeless tobacco: Never Used  . Alcohol Use: No   OB History   Grav Para Term Preterm Abortions TAB SAB Ect Mult Living                 Review of Systems  Constitutional: Negative for fever.  HENT: Negative for rhinorrhea and sore throat.   Eyes: Negative for visual disturbance.  Respiratory: Positive for cough, chest tightness and shortness of breath. Negative for wheezing.   Cardiovascular: Positive for chest pain. Negative for leg swelling.  Gastrointestinal: Negative for nausea, vomiting, abdominal pain and abdominal distention.  Genitourinary: Negative for difficulty urinating.  Musculoskeletal: Negative for back pain.  Skin: Negative for rash.  Neurological: Negative for headaches.  Hematological: Negative for adenopathy.  Psychiatric/Behavioral:  Negative for agitation.    Allergies  Amiodarone and Diphenhydramine hcl  Home Medications   Current Outpatient Rx  Name  Route  Sig  Dispense  Refill  . azelastine (ASTELIN) 137 MCG/SPRAY nasal spray   Nasal   Place 1 spray into the nose 2 (two) times daily. Use in each nostril as directed         . Cholecalciferol (VITAMIN D) 2000 UNITS tablet   Oral   Take 2,000 Units by mouth daily.           Marland Kitchen ezetimibe (ZETIA) 10 MG tablet   Oral   Take 10 mg by mouth daily.         Marland Kitchen EXPIRED: furosemide (LASIX) 20 MG tablet   Oral   Take 1 tablet (20 mg total) by mouth daily.   30 tablet   0   . levofloxacin (LEVAQUIN) 500 MG tablet   Oral   Take 1 tablet (500 mg total) by mouth  daily.   2 tablet   0   . LORazepam (ATIVAN) 0.5 MG tablet   Oral   Take 0.5 mg by mouth every 8 (eight) hours as needed for anxiety.          . metoprolol tartrate (LOPRESSOR) 25 MG tablet   Oral   Take 12.5 mg by mouth 2 (two) times daily.         . metoprolol tartrate (LOPRESSOR) 25 MG tablet      TAKE 1/2 TABLET BY MOUTH TWICE A DAY   90 tablet   0   . pantoprazole (PROTONIX) 40 MG tablet   Oral   Take 40 mg by mouth daily.         . pantoprazole (PROTONIX) 40 MG tablet      TAKE 1 TABLET BY MOUTH EVERY DAY   90 tablet   0     PATIENT NEEDS TO CALL OUR OFFICE TO SCHEDULE A FOL ...   . EXPIRED: potassium chloride (K-DUR) 10 MEQ tablet   Oral   Take 1 tablet (10 mEq total) by mouth 2 (two) times daily.   30 tablet   0   . TIKOSYN 250 MCG capsule      TAKE ONE CAPSULE BY MOUTH TWICE A DAY   60 capsule   3   . warfarin (COUMADIN) 1 MG tablet   Oral   Take 2-3 mg by mouth daily. 3 mg daily except 2 mg on tuesdays and thursdays         . warfarin (COUMADIN) 1 MG tablet      TAKE AS DIRECTED BY ANTICOAG CLINIC   90 tablet   3   . ZETIA 10 MG tablet      TAKE 1 TABLET BY MOUTH EVERY DAY   90 tablet   0     PATIENT NEEDS TO CALL OUR OFFICE TO SCHEDULE A FOL ...    There were no vitals taken for this visit. Physical Exam  Nursing note and vitals reviewed. Constitutional: She is oriented to person, place, and time. She appears well-developed and well-nourished.  HENT:  Head: Normocephalic and atraumatic.  Right Ear: External ear normal.  Left Ear: External ear normal.  Eyes: Conjunctivae and EOM are normal. Pupils are equal, round, and reactive to light.  Neck: Normal range of motion. Neck supple.  Cardiovascular: Normal rate, regular rhythm, normal heart sounds and intact distal pulses.   Pulmonary/Chest: Effort normal. She has no wheezes. She has rhonchi in  the right lower field and the left lower field.  Abdominal: Soft. There is no  tenderness.  Musculoskeletal: Normal range of motion. She exhibits no edema and no tenderness.  Neurological: She is alert and oriented to person, place, and time.  Skin: Skin is warm and dry.  Psychiatric: She has a normal mood and affect.    ED Course  Procedures (including critical care time) Labs Review Labs Reviewed  CBC - Abnormal; Notable for the following:    RBC 3.59 (*)    Hemoglobin 7.3 (*)    HCT 24.8 (*)    MCV 69.1 (*)    MCH 20.3 (*)    MCHC 29.4 (*)    RDW 19.1 (*)    All other components within normal limits  BASIC METABOLIC PANEL - Abnormal; Notable for the following:    Glucose, Bld 103 (*)    GFR calc non Af Amer 78 (*)    All other components within normal limits  PRO B NATRIURETIC PEPTIDE - Abnormal; Notable for the following:    Pro B Natriuretic peptide (BNP) 1458.0 (*)    All other components within normal limits  PROTIME-INR - Abnormal; Notable for the following:    Prothrombin Time 23.2 (*)    INR 2.14 (*)    All other components within normal limits  OCCULT BLOOD X 1 CARD TO LAB, STOOL  POCT I-STAT TROPONIN I  OCCULT BLOOD, POC DEVICE   Imaging Review Dg Chest 2 View  08/10/2013   CLINICAL DATA:  Shortness of Breath  EXAM: CHEST  2 VIEW  COMPARISON:  Feb 21, 2013  FINDINGS: There is underlying emphysema. Heart is mildly enlarged with pulmonary venous hypertension. There is trace edema. There is no airspace consolidation.  Patient is status post mitral valve replacement. Pacemaker leads are attached to the right atrium and right ventricle. Patient is status post coronary artery bypass grafting. The bones are osteoporotic.  IMPRESSION: The evidence of a degree of congestive heart failure superimposed on emphysematous change. No edema or consolidation.   Electronically Signed   By: Bretta Bang M.D.   On: 08/10/2013 12:58    EKG Interpretation     Ventricular Rate:    PR Interval:    QRS Duration:   QT Interval:    QTC Calculation:   R  Axis:     Text Interpretation:              MDM   1. Acute on chronic systolic heart failure   2. Atrial fibrillation   3. Coronary atherosclerosis of unspecified type of vessel, native or graft   4. Anemia    79 yo female with hx of CHF, CAD, mitral valve repair, atrial fib on chronic coumadin, pacemaker presents with chest pressure and dyspnea. Consider CHF, pneumonia, bronchitis. Anticoagulated on coumadin, no hypoxia, therapeutic INR, doubt PE. EKG reveals paced rhythm with no new ST or Twave changes. Labs reveal anemia, 7.3 when compared to 9.7, hemocult neg, no obvious signs of bleeding. BNP mildly elevated with CXR findings suggestive of early CHF, no pneumonia. Initial troponin neg. VSS. Clinical picture suggestive of acute on chronic CHF. Cardiology consulted for admission.    Simmie Davies, NP 08/10/13 2017

## 2013-08-10 NOTE — Progress Notes (Signed)
1845 Report received from Maralyn Sago ,ED RN

## 2013-08-11 DIAGNOSIS — D509 Iron deficiency anemia, unspecified: Secondary | ICD-10-CM

## 2013-08-11 LAB — OCCULT BLOOD X 1 CARD TO LAB, STOOL: Fecal Occult Bld: NEGATIVE

## 2013-08-11 LAB — TYPE AND SCREEN: ABO/RH(D): O POS

## 2013-08-11 LAB — BASIC METABOLIC PANEL
BUN: 14 mg/dL (ref 6–23)
Calcium: 8.6 mg/dL (ref 8.4–10.5)
Creatinine, Ser: 0.81 mg/dL (ref 0.50–1.10)
GFR calc Af Amer: 77 mL/min — ABNORMAL LOW (ref >90)
GFR calc non Af Amer: 66 mL/min — ABNORMAL LOW (ref >90)
Potassium: 3.9 mEq/L (ref 3.5–5.1)

## 2013-08-11 LAB — CBC
MCH: 21.3 pg — ABNORMAL LOW (ref 26.0–34.0)
MCHC: 30.3 g/dL (ref 30.0–36.0)
Platelets: 353 10*3/uL (ref 150–400)
RDW: 18.6 % — ABNORMAL HIGH (ref 11.5–15.5)

## 2013-08-11 LAB — PRO B NATRIURETIC PEPTIDE: Pro B Natriuretic peptide (BNP): 1302 pg/mL — ABNORMAL HIGH (ref 0–450)

## 2013-08-11 MED ORDER — DOFETILIDE 250 MCG PO CAPS
250.0000 ug | ORAL_CAPSULE | Freq: Two times a day (BID) | ORAL | Status: DC
  Administered 2013-08-11 – 2013-08-12 (×2): 250 ug via ORAL
  Filled 2013-08-11 (×4): qty 1

## 2013-08-11 MED ORDER — POTASSIUM CHLORIDE CRYS ER 10 MEQ PO TBCR
10.0000 meq | EXTENDED_RELEASE_TABLET | Freq: Two times a day (BID) | ORAL | Status: DC
  Administered 2013-08-11 – 2013-08-12 (×2): 10 meq via ORAL
  Filled 2013-08-11 (×3): qty 1

## 2013-08-11 MED ORDER — FUROSEMIDE 20 MG PO TABS
20.0000 mg | ORAL_TABLET | Freq: Every day | ORAL | Status: DC
  Administered 2013-08-11 – 2013-08-12 (×2): 20 mg via ORAL
  Filled 2013-08-11 (×2): qty 1

## 2013-08-11 MED ORDER — LISINOPRIL 2.5 MG PO TABS
2.5000 mg | ORAL_TABLET | Freq: Every day | ORAL | Status: DC
  Administered 2013-08-11: 2.5 mg via ORAL
  Filled 2013-08-11 (×3): qty 1

## 2013-08-11 NOTE — Progress Notes (Signed)
Patient Name: [name] Date of Encounter: 08/11/2013     Principal Problem:   Acute on chronic systolic heart failure Active Problems:   CAD   Atrial fibrillation   Hypercholesteremia   Microcytic anemia    SUBJECTIVE  [name] is seen sitting up in bed today. Her son is present. Patient is feeling much better today. She has been ambulating with no SOB and is feeling less edematous. The subjective swelling in her breasts has resolved and her energy levels have improved. No CP, palpitations, abdominal pain, swelling.   CURRENT MEDS . azelastine  1 spray Each Nare BID  . cholecalciferol  2,000 Units Oral Daily  . ezetimibe  10 mg Oral Daily  . furosemide  20 mg Oral Daily  . metoprolol tartrate  12.5 mg Oral BID  . pantoprazole  40 mg Oral Daily  . potassium chloride  10 mEq Oral BID  . zolpidem  5 mg Oral QHS    OBJECTIVE  Filed Vitals:   08/11/13 0600 08/11/13 1007 08/11/13 1418 08/11/13 1524  BP: 109/53 114/40 113/52   Pulse: 79 85 88   Temp: 97.6 F (36.4 C)  97 F (36.1 C)   TempSrc: Oral  Oral   Resp: 18 16 18    Height:      Weight: 161 lb (73.029 kg)   162 lb 1.6 oz (73.528 kg)  SpO2: 97% 95% 93%     Intake/Output Summary (Last 24 hours) at 08/11/13 1548 Last data filed at 08/11/13 1341  Gross per 24 hour  Intake   1187 ml  Output   1860 ml  Net   -673 ml   Filed Weights   08/10/13 1939 08/11/13 0600 08/11/13 1524  Weight: 163 lb 11.2 oz (74.254 kg) 161 lb (73.029 kg) 162 lb 1.6 oz (73.528 kg)    PHYSICAL EXAM  General: Patient is seen with her son. Patient still ornery, but seems in better spirits today Psych: Normal affect.  Neuro: Alert and oriented X 3. Moves all extremities spontaneously.  HEENT: Normal  Neck: Supple without bruits + JVD 9cm  Lungs: Rales at based. Rhonchi and course breath sounds. Slightly improved from yesterday Heart: RRR S1/ S2 No S3 or S4. II/VI systolic murmur at apex.  Abdomen: Soft, non-tender,  non-distended, BS + x 4. No hepatomegaly  Extremities: No clubbing, cyanosis or edema. DP/PT/Radials 2+ and equal bilaterally.   Accessory Clinical Findings  CBC  Recent Labs  08/10/13 1119 08/11/13 0500  WBC 6.6 6.6  HGB 7.3* 8.4*  HCT 24.8* 27.7*  MCV 69.1* 70.1*  PLT 355 353   Basic Metabolic Panel  Recent Labs  08/10/13 1119 08/11/13 0500  NA 139 140  K 3.9 3.9  CL 107 106  CO2 21 24  GLUCOSE 103* 79  BUN 15 14  CREATININE 0.72 0.81  CALCIUM 8.7 8.6  MG  --  2.1   BNP 1458 (11/3)  1302 (11/4)  ECG  08/10/13  SR 80 with occasional atrial paced beats. LBBB   Radiology/Studies   Dg Chest 2 View  08/10/2013 FINDINGS: There is underlying emphysema. Heart is mildly enlarged with pulmonary venous hypertension. There is trace edema. There is no airspace consolidation. Patient is status post mitral valve replacement. Pacemaker leads are attached to the right atrium and right ventricle. Patient is status post coronary artery bypass grafting. The bones are osteoporotic. IMPRESSION: The evidence of a degree of congestive heart failure superimposed on emphysematous change. No edema or consolidation.  ASSESSMENT AND PLAN This is an 79 yo female with CAD and systolic CHF. She presented to ED last night with CHF exacerbation and found to be profoundly anemic.   Anemia- microcytic: improved today after 1 unit PRBC's and I U Ferraheme. 7.3 (11/3) 8.4 today. Suspect this is the driving force of worsening CHF. Fecal occult blood negative. Continue ferrous sulfate 325 mg   Acute on Chronic CHF: Net loss of 1,676 ml and 1lb weight loss. BNP improved 1458 (11/3), 1302 today after 1 injection of Lasix 40mg . She is feeling much less short of breath. Will put on 20 po lasix and 10 mEq Kdur  Afib: continue Tikosyn. Discontinued coumadin until she has anemia worked up  Depression: addressed case management consult for discussion of group home options. Was appreciative but  declined  Plan to discharge tomorrow  Signed, STERN, KATHRYN PA-C    I have seen and examined the patient. I agree with the above note with the addition of : fluid overload is improved. Anemia improved with transfusion. Lungs are clear by exam.  Plan: start PO Lasix and small dose ACE I. Continue Dofetilide for A-fib. I don't think it is safe to continue Warfarin without work up for anemia which the patient is refusing.  Possible discharge tomorrow.   Lorine Bears MD, [facility] 08/11/2013 7:27 PM

## 2013-08-11 NOTE — Consult Note (Signed)
Pharmacist Heart Failure Core Measure Documentation  Assessment: [name] has an EF documented as 35% on 02/2013 by ECHO..  Rationale: Heart failure patients with left ventricular systolic dysfunction (LVSD) and an EF < 40% should be prescribed an angiotensin converting enzyme inhibitor (ACEI) or angiotensin receptor blocker (ARB) at discharge unless a contraindication is documented in the medical record.  This patient is not currently on an ACEI or ARB for HF.  This note is being placed in the record in order to provide documentation that a contraindication to the use of these agents is present for this encounter.  ACE Inhibitor or Angiotensin Receptor Blocker is contraindicated (specify all that apply)  []   ACEI allergy AND ARB allergy []   Angioedema []   Moderate or severe aortic stenosis []   Hyperkalemia []   Hypotension []   Renal artery stenosis []   Worsening renal function, preexisting renal disease or dysfunction  Thank you.  Laurena Bering 08/11/2013 3:33 PM

## 2013-08-12 ENCOUNTER — Telehealth: Payer: Self-pay

## 2013-08-12 LAB — CBC
HCT: 27.4 % — ABNORMAL LOW (ref 36.0–46.0)
Hemoglobin: 8.5 g/dL — ABNORMAL LOW (ref 12.0–15.0)
MCHC: 31 g/dL (ref 30.0–36.0)
MCV: 70.3 fL — ABNORMAL LOW (ref 78.0–100.0)
RDW: 19 % — ABNORMAL HIGH (ref 11.5–15.5)

## 2013-08-12 MED ORDER — DOFETILIDE 250 MCG PO CAPS: 250.0000 ug | ORAL_CAPSULE | Freq: Two times a day (BID) | ORAL | Status: DC

## 2013-08-12 MED ORDER — POTASSIUM CHLORIDE CRYS ER 10 MEQ PO TBCR: 10.0000 meq | EXTENDED_RELEASE_TABLET | Freq: Two times a day (BID) | ORAL | Status: DC

## 2013-08-12 MED ORDER — FUROSEMIDE 20 MG PO TABS: 20.0000 mg | ORAL_TABLET | Freq: Every day | ORAL | Status: DC

## 2013-08-12 MED ORDER — LISINOPRIL 2.5 MG PO TABS: 2.5000 mg | ORAL_TABLET | Freq: Every day | ORAL | Status: DC

## 2013-08-12 NOTE — Progress Notes (Cosign Needed)
Patient Name: [name] Date of Encounter: 08/12/2013  Principal Problem:  Acute on chronic systolic heart failure  Active Problems:  CAD  Atrial fibrillation  Hypercholesteremia  Microcytic anemia   SUBJECTIVE  [name] is seen sitting up in bed today. Patient is feeling okay. She slept well last night and was able to sleep flat with one pillow. She has been up to shower and ambulating with some SOB. She is feeling less edematous. When asked if she is feeling better than when she arrived to the hospital, she replies; "I couldn't tell you, I don't remember how I felt." No CP, palpitations, abdominal pain, swelling.      CURRENT MEDS . azelastine  1 spray Each Nare BID  . cholecalciferol  2,000 Units Oral Daily  . dofetilide  250 mcg Oral Q12H  . ezetimibe  10 mg Oral Daily  . furosemide  20 mg Oral Daily  . lisinopril  2.5 mg Oral Daily  . metoprolol tartrate  12.5 mg Oral BID  . pantoprazole  40 mg Oral Daily  . potassium chloride  10 mEq Oral BID  . zolpidem  5 mg Oral QHS    OBJECTIVE  Filed Vitals:   08/11/13 1524 08/11/13 2127 08/12/13 0232 08/12/13 0447  BP:  112/59 112/43 102/40  Pulse:  97 81 80  Temp:  98 F (36.7 C)  97.8 F (36.6 C)  TempSrc:  Oral  Oral  Resp:  18 18 18   Height:      Weight: 162 lb 1.6 oz (73.528 kg)   161 lb 3.2 oz (73.12 kg)  SpO2:  97% 98% 99%    Intake/Output Summary (Last 24 hours) at 08/12/13 0827 Last data filed at 08/12/13 1610  Gross per 24 hour  Intake    820 ml  Output    706 ml  Net    114 ml   Filed Weights   08/11/13 0600 08/11/13 1524 08/12/13 0447  Weight: 161 lb (73.029 kg) 162 lb 1.6 oz (73.528 kg) 161 lb 3.2 oz (73.12 kg)    PHYSICAL EXAM  General: Patient is seen with her son. Patient still ornery, but seems in better spirits today  Psych: Normal affect.  Neuro: Alert and oriented X 3. Moves all extremities spontaneously.  HEENT: Normal  Neck: Supple without bruits  Lungs: Rhonchi and course  breath sounds. Slightly improved from yesterday  Heart: RRR S1/ S2. II/VI systolic murmur at apex.  Abdomen: Soft, non-tender, non-distended, BS + x 4. No hepatomegaly  Extremities: No clubbing, cyanosis or edema. DP/PT/Radials 2+ and equal bilaterally.  Accessory Clinical Findings  CBC  Recent Labs  08/10/13 1119 08/11/13 0500  WBC 6.6 6.6  HGB 7.3* 8.4*  HCT 24.8* 27.7*  MCV 69.1* 70.1*  PLT 355 353   Basic Metabolic Panel  Recent Labs  08/10/13 1119 08/11/13 0500  NA 139 140  K 3.9 3.9  CL 107 106  CO2 21 24  GLUCOSE 103* 79  BUN 15 14  CREATININE 0.72 0.81  CALCIUM 8.7 8.6  MG  --  2.1   TELE  SR 82bpm LBBB  ECG  08/10/13  SR 80 with occasional atrial paced beats. LBBB   Radiology/Studies  Dg Chest 2 View  Dg Chest 2 View  08/10/2013 FINDINGS: There is underlying emphysema. Heart is mildly enlarged with pulmonary venous hypertension. There is trace edema. There is no airspace consolidation. Patient is status post mitral valve replacement. Pacemaker leads are attached to the  right atrium and right ventricle. Patient is status post coronary artery bypass grafting. The bones are osteoporotic. IMPRESSION: The evidence of a degree of congestive heart failure superimposed on emphysematous change. No edema or consolidation.   ASSESSMENT AND PLAN This is an 79 yo female with CAD and systolic CHF. She presented to ED 08/10/13 with CHF exacerbation and found to be profoundly anemic.   Acute on Chronic CHF: Net loss of 1,139 ml and 1lb weight loss. BNP improved. Contine 20 po lasix. Lisinopril 2.5mg  added as her EF < 40%  Anemia- microcytic: improved after 1 unit PRBC's and I U Ferraheme. 8.4 yesterday [name] studies show a very low Ferratin of 12 and 3% sat.  Fecal occult blood negative. Patient is clearly iron deficient and probably needs colonoscopy as an outpatient. Does not have a PCP and will likely refuse colonoscopy. Continue ferrous sulfate 325 mg   Afib:  continue Tikosyn. Elected to discontinue Warfarin until anemia is worked up.   Depression: agreed to talk to case management consult for discussion of group home options  Will probably be able to be discharged today, will discuss with Dr. Kirke Corin.   Signed, Thereasa Parkin PA-C

## 2013-08-12 NOTE — Telephone Encounter (Signed)
New problem    7 days TCM with Dawayne Patricia on  11/12 @ 10 am

## 2013-08-12 NOTE — Discharge Summary (Signed)
Discharge Summary   Patient ID: [name] MRN: [id number], DOB/AGE: Oct 16, 1929 79 y.o. Admit date: 08/10/2013 D/C date:     08/12/2013  Primary Cardiologist: [name] PCP: Kim  Principal Problem:   Acute on chronic systolic heart failure             - Net loss 899 ml and 1 lb weight loss (161 lbs on discharge)                         Active Problems:   Microcytic anemia   CAD           Atrial fibrillation   Tachy-brady syndrome   Hypercholesteremia   HTN    HPI: [name] is a 79 y.o. female with a history of CAD s/p CABG x 2 with MV repair and Maze (05/2008), AFIB in sinus on Tikosyn and coumadin, chronic systolic CHF with EF 35% (ECHO 02/2013), HTN, hyperlipidemia, MR, tachy-brady syndrome (pacemaker implanted 2009) and depression. She was in her usual state of health until 08/10/13 when she began to experience worsening SOB and chest pressure. She presented to the ED and admitted for exacerbation of chronic systolic heart failure and found to be profoundly anemic (hg 7.3).  Hospital Course:  It was suspected that anemia was the driving force for worsening CHF. Coumadin was discontinued as it was not felt safe to continue without knowing the etiology of her anemia. Blood counts and iron studies revealed a microcytic anemia, ferritin of 12 and 3% sat. Fecal occult blood negative. Patient refused any further invasive investigations.   Patient was diuresed with net losses of 1,676 ml and 1 lb. She was given 1 unit PRBC's, I U Ferraheme and supplemented with PO iron. Hg 8.4 yesterday.   Patient is extremely depressed and lonely; she is very vocal about wanting to die and not wanting any invasive interventions; however she is not suicidal and does not want to harm herself or others. She lives alone in her home in [place]. After many discussions she finally agreed to meet with case management to talk about group home options.  [name] was deemed ready for discharge today  by [name]. She is feeling much better with no SOB or CP. Follow up appointments have been made with cardiology and primary care. She has not been seen by a PCP in over two years. Patient is clearly iron deficient and probably needs colonoscopy as an outpatient, although she will likely refuse.  Continue to hold Coumadin until anemia is further worked up as an outpatient. Patient was made aware of the risks of discontinuing anticoagulation in the setting of AFIb and is okay with it.   Discharge Vitals: Blood pressure 102/40, pulse 80, temperature 97.8 F (36.6 C), temperature source Oral, resp. rate 18, height 5\' 5"  (1.651 m), weight 161 lb 3.2 oz (73.12 kg), SpO2 99.00%.  Labs: Lab Results  Component Value Date   WBC 6.6 08/11/2013   HGB 8.4* 08/11/2013   HCT 27.7* 08/11/2013   MCV 70.1* 08/11/2013   PLT 353 08/11/2013     Recent Labs Lab 08/11/13 0500  NA 140  K 3.9  CL 106  CO2 24  BUN 14  CREATININE 0.81  CALCIUM 8.6  GLUCOSE 79      Diagnostic Studies/Procedures   Dg Chest 2 View  08/10/2013   CLINICAL DATA:  Shortness of Breath  EXAM: CHEST  2 VIEW  COMPARISON:  Feb 21, 2013  FINDINGS: There is underlying emphysema. Heart is mildly enlarged with pulmonary venous hypertension. There is trace edema. There is no airspace consolidation.  Patient is status post mitral valve replacement. Pacemaker leads are attached to the right atrium and right ventricle. Patient is status post coronary artery bypass grafting. The bones are osteoporotic.  IMPRESSION: The evidence of a degree of congestive heart failure superimposed on emphysematous change. No edema or consolidation.   Electronically Signed   By: [name] M.D.   On: 08/10/2013 12:58    Discharge Medications     Medication List    STOP taking these medications       warfarin 1 MG tablet  Commonly known as:  COUMADIN      TAKE these medications       azelastine 137 MCG/SPRAY nasal spray  Commonly known as:   ASTELIN  Place 1 spray into the nose 2 (two) times daily. Use in each nostril as directed     dofetilide 250 MCG capsule  Commonly known as:  TIKOSYN  Take 1 capsule (250 mcg total) by mouth every 12 (twelve) hours.     ezetimibe 10 MG tablet  Commonly known as:  ZETIA  Take 10 mg by mouth daily.     furosemide 20 MG tablet  Commonly known as:  LASIX  Take 1 tablet (20 mg total) by mouth daily.     lisinopril 2.5 MG tablet  Commonly known as:  PRINIVIL,ZESTRIL  Take 1 tablet (2.5 mg total) by mouth daily.     metoprolol tartrate 25 MG tablet  Commonly known as:  LOPRESSOR  Take 12.5 mg by mouth 2 (two) times daily.     pantoprazole 40 MG tablet  Commonly known as:  PROTONIX  Take 40 mg by mouth daily.     potassium chloride 10 MEQ tablet  Commonly known as:  K-DUR,KLOR-CON  Take 1 tablet (10 mEq total) by mouth 2 (two) times daily.     Vitamin D 2000 UNITS tablet  Take 2,000 Units by mouth daily.        Disposition   The patient will be discharged in stable condition to home.  Future Appointments Provider Department Dept Phone   08/19/2013 10:00 AM Kim Macadamia, NP [facility] [place] Office [phone]   08/24/2013 3:00 PM Kim Pierini, FNP [facility] at [name] [phone]     Follow-up Information   Follow up with Kim Fredrickson, NP On 08/19/2013. (10am)    Specialty:  Nurse Practitioner   Contact information:   [address] [place] [address] [phone]       Follow up with Kim Quiet, FNP On 08/24/2013. (3 pm )    Specialty:  Family Medicine   Contact information:   [address] [place] [address] [phone]         Duration of Discharge Encounter: Greater than 30 minutes including physician and PA time.  SignedVenetia [name], Kim Acre PA-C 08/12/2013, 9:54 AM

## 2013-08-14 NOTE — Telephone Encounter (Signed)
TCM pt.  Reviewed medications and discharge instructions.  She states she is overwhelmed with all the medications.  Is aware of app w/ Norma Fredrickson, NP on 11/12. No questions regarding medications.  Will call if has any problems before app.

## 2013-08-19 ENCOUNTER — Ambulatory Visit (INDEPENDENT_AMBULATORY_CARE_PROVIDER_SITE_OTHER): Payer: Medicare Other | Admitting: Nurse Practitioner

## 2013-08-19 ENCOUNTER — Encounter: Payer: Self-pay | Admitting: Nurse Practitioner

## 2013-08-19 VITALS — BP 117/60 | HR 96 | Ht 65.0 in | Wt 159.2 lb

## 2013-08-19 DIAGNOSIS — I4891 Unspecified atrial fibrillation: Secondary | ICD-10-CM

## 2013-08-19 LAB — CBC WITH DIFFERENTIAL/PLATELET
Basophils Absolute: 0 10*3/uL (ref 0.0–0.1)
Basophils Relative: 0.5 % (ref 0.0–3.0)
Eosinophils Absolute: 0 10*3/uL (ref 0.0–0.7)
Eosinophils Relative: 0.4 % (ref 0.0–5.0)
HCT: 34.2 % — ABNORMAL LOW (ref 36.0–46.0)
Hemoglobin: 10.7 g/dL — ABNORMAL LOW (ref 12.0–15.0)
Lymphocytes Relative: 18.5 % (ref 12.0–46.0)
Lymphs Abs: 1.5 10*3/uL (ref 0.7–4.0)
MCHC: 31.2 g/dL (ref 30.0–36.0)
MCV: 73.7 fl — ABNORMAL LOW (ref 78.0–100.0)
Monocytes Absolute: 0.7 10*3/uL (ref 0.1–1.0)
Monocytes Relative: 9.2 % (ref 3.0–12.0)
Neutro Abs: 5.8 10*3/uL (ref 1.4–7.7)
Neutrophils Relative %: 71.4 % (ref 43.0–77.0)
Platelets: 297 10*3/uL (ref 150.0–400.0)
RBC: 4.64 Mil/uL (ref 3.87–5.11)
RDW: 30 % — ABNORMAL HIGH (ref 11.5–14.6)
WBC: 8.1 10*3/uL (ref 4.5–10.5)

## 2013-08-19 LAB — BASIC METABOLIC PANEL
BUN: 25 mg/dL — ABNORMAL HIGH (ref 6–23)
CO2: 28 mEq/L (ref 19–32)
Calcium: 9.6 mg/dL (ref 8.4–10.5)
Chloride: 104 mEq/L (ref 96–112)
Creatinine, Ser: 0.9 mg/dL (ref 0.4–1.2)
GFR: 61.45 mL/min (ref >60.00)
Glucose, Bld: 151 mg/dL — ABNORMAL HIGH (ref 70–99)
Potassium: 4.1 mEq/L (ref 3.5–5.1)
Sodium: 139 mEq/L (ref 135–145)

## 2013-08-19 MED ORDER — FERROUS SULFATE 325 (65 FE) MG PO TABS: 325.0000 mg | ORAL_TABLET | Freq: Every day | ORAL | Status: DC

## 2013-08-19 NOTE — Patient Instructions (Signed)
I would like for you to add an over the counter iron pill = take one a day  Stay on your current medicines  We need to check labs today  I will see you back in a month  Call the [facility] office at [phone] if you have any questions, problems or concerns.

## 2013-08-19 NOTE — Progress Notes (Signed)
[name] Date of Birth: August 05, 1930 Medical Record #[id number]  History of Present Illness: [name] is seen back today for a post hospital/TOC visit. Seen for Dr. Johney Frame. Former patient of Dr. Elvis Coil. She has CAD with CABG x 2 with MV repair and MAZE in 2009,  AF - maintained on Tikosyn, chronic systolic HF with EF of 35% per echo from May of 2014, tachybrady syndrome with PPM in 2009, depression, HLD and HTN.   Most recently in the hospital with worsening dyspnea and chest pressure - found to be profoundly anemia and having an acute systolic HF event. Anemia was felt to be the driving force for her heart failure. Apparently very depressed and lonely - was quite vocal about wanting to die. Was not felt to be suicidal. Her coumadin was stopped. She refused GI evaluation.   Comes back today - here with her son - [name]. She says she is doing ok. Still uses too much salt. Not very interested in trying to make any aspect of her life better. No chest pain. Says her breathing is better. Still with "fluid". Taking her medicines but not on iron. Still does not want a GI evaluation. Not really interested in having further transfusions as well. Keeps reiterating that she wants to die and is "ready to go".   Current Outpatient Prescriptions  Medication Sig Dispense Refill  . azelastine (ASTELIN) 137 MCG/SPRAY nasal spray Place 1 spray into the nose 2 (two) times daily. Use in each nostril as directed      . Cholecalciferol (VITAMIN D) 2000 UNITS tablet Take 2,000 Units by mouth daily.        Marland Kitchen dofetilide (TIKOSYN) 250 MCG capsule Take 1 capsule (250 mcg total) by mouth every 12 (twelve) hours.  60 capsule  3  . ezetimibe (ZETIA) 10 MG tablet Take 10 mg by mouth daily.      . furosemide (LASIX) 20 MG tablet Take 1 tablet (20 mg total) by mouth daily.  30 tablet  3  . lisinopril (PRINIVIL,ZESTRIL) 2.5 MG tablet Take 1 tablet (2.5 mg total) by mouth daily.  30 tablet  3  . metoprolol tartrate  (LOPRESSOR) 25 MG tablet Take 12.5 mg by mouth 2 (two) times daily.      . pantoprazole (PROTONIX) 40 MG tablet Take 40 mg by mouth daily.      . potassium chloride (K-DUR,KLOR-CON) 10 MEQ tablet Take 1 tablet (10 mEq total) by mouth 2 (two) times daily.  60 tablet  3   No current facility-administered medications for this visit.    Allergies  Allergen Reactions  . Amiodarone     unknown  . Diphenhydramine Hcl Other (See Comments)    unknown    Past Medical History  Diagnosis Date  . Coronary artery disease     a. 1996 s/p PS BMS;  b. 03/2007 NSTEMI/PCI: 3.0x24 and 3.0x28 Taxus DES' t RCA; c. 05/2008 CABG x 2 with MV repair and Maze (LIMA->LAD, VG->PDA)  . Atrial fibrillation     a. in sinus on Tikosyn and coumadin;  b. s/p MAZE @ time of MVR/CABG 05/2008;  c. h/o pulm toxicity while on amio.  . Chronic systolic CHF (congestive heart failure)     a. 02/2013 Echo: EF 35% inflat AK, mod to sev calcified MV annulus w/o MS, mod dil LA, PASP .  Marland Kitchen Hypertension   . Anxiety   . Depression   . Hyperlipidemia   . Mitral insufficiency     a.  05/2008 s/p MV repair with [employer] MV ring (model 5200, 26mm ser # [id number].  Marland Kitchen IDA (iron deficiency anemia)   . Subarachnoid hemorrhage     a. in setting of syncope/fall/digoxin toxicity 11/2007.  Marland Kitchen LBBB (left bundle branch block)   . Chronic anticoagulation     coumadin  . High risk medication use     Tikosyn  . Tachycardia-bradycardia     a. 05/2008 s/p MDT P1501DR Enrhythm DC PPM, ser # [id number].    Past Surgical History  Procedure Laterality Date  . Cardiac catheterization  03/19/2007    EF 45%. THERE IS 2+ MITRAL INSUFFICIENCY. THERE IS MODERATE INFERIOR WALL HYPOKINESIA WITH OVERALL MILD TO MODERATE LV DYSFUNCTION  . Coronary stent placement  1996, AND 03/2007    RIGHT CORONARY  . Removal of colon polyp      AND A LARGE CECAL POLYP  . Coronary artery bypass graft  04/2008    INCLUDING A MITRAL VALVE REPAIR AND A LEFT SIDED  MAZE PROCEDURE  . Coronary artery bypass graft      X2 with LIMA to LAD and SVG to PD and WITH A LEFT SIDED MAZE PROCEDURE & MITRAL VALVE REPAIR. THIS INCLUDED AN LIMA GRAFT TO THE LAD, AND A SAPHENOUS VEIN GRAFT TO THE PDA.  Marland Kitchen Transthoracic echocardiogram  06/21/2008    EF 35%  . Pacemaker insertion  05/19/2008    MDT EnRhythm implanted for mobitz II AV block by Dr Reyes Ivan  . Mitral valve repair      History  Smoking status  . Former Smoker  . Quit date: 05/06/1996  Smokeless tobacco  . Never Used    History  Alcohol Use No    Family History  Problem Relation Age of Onset  . Heart attack Mother   . Cancer Father     Review of Systems: The review of systems is per the HPI.  All other systems were reviewed and are negative.  Physical Exam: BP 117/60  Pulse 96  Ht 5\' 5"  (1.651 m)  Wt 159 lb 4 oz (72.235 kg)  BMI 26.50 kg/m2 Patient is alert and in no acute distress. Affect quite flat. Skin is warm and dry. Color is normal.  HEENT is unremarkable. Normocephalic/atraumatic. PERRL. Sclera are nonicteric. Neck is supple. No masses. No JVD. Lungs are clear. Cardiac exam shows a regular rate and rhythm. Abdomen is soft. Extremities are without edema. Gait and ROM are intact. No gross neurologic deficits noted.  Wt Readings from Last 3 Encounters:  08/19/13 159 lb 4 oz (72.235 kg)  08/12/13 161 lb 3.2 oz (73.12 kg)  02/24/13 145 lb (65.772 kg)    LABORATORY DATA: EKG shows sinus with LBBB rate of 96. Not atrial fib.   Lab Results  Component Value Date   WBC 7.4 08/12/2013   HGB 8.5* 08/12/2013   HCT 27.4* 08/12/2013   PLT 335 08/12/2013   GLUCOSE 79 08/11/2013   CHOL 204* 05/07/2012   TRIG 196.0* 05/07/2012   HDL 40.50 05/07/2012   LDLDIRECT 136.5 05/07/2012   ALT 18 05/07/2012   AST 22 05/07/2012   NA 140 08/11/2013   K 3.9 08/11/2013   CL 106 08/11/2013   CREATININE 0.81 08/11/2013   BUN 14 08/11/2013   CO2 24 08/11/2013   TSH 0.954 02/21/2013   INR 2.14* 08/10/2013   HGBA1C  5.7* 02/21/2013   Echo Study Conclusions from May 2014  - Left ventricle: There is calcification, thinning, and akinesis of the inferolateral  wall. The cavity size was normal. Wall thickness was increased in a pattern of mild LVH. The estimated ejection fraction was 35%. - Aortic valve: Sclerosis without stenosis. No significant regurgitation. - Mitral valve: Moderately to severely calcified annulus. But no significant mitral stenosis. - Left atrium: The atrium was moderately dilated. - Right ventricle: Poorly visualized. The cavity size was normal. Systolic function was normal. - Pulmonary arteries: PA peak pressure: 33mm Hg (S).   Assessment / Plan: 1. Systolic HF - recent acute exacerbation - EF of 35% - now on Lasix and potassium - we will recheck her labs. I have left her on her current regimen since salt is going to be a definite issue.   2. Anemia - looks iron deficient - will try to get her to take some OTC iron. Not interested in GI evaluation and tells me that she is not going to have further blood transfusions.   3. CAD - no chest pain reported.   4. PAF - holding sinus on Tikosyn.   5. Depression - this has been long standing. She refuses help. Wants to die - this is not a new issue. Not suicidal.  Will recheck her labs today. Will try to get her on some OTC iron. Son is asking about where Dr. Duanne Guess is now located - her business card was given to him.   Patient is agreeable to this plan and will call if any problems develop in the interim.   [name], RN, ANP-C [facility] [address] [place], [place]  [address]

## 2013-08-24 ENCOUNTER — Ambulatory Visit: Payer: Medicare Other | Admitting: Family

## 2013-08-26 ENCOUNTER — Telehealth: Payer: Self-pay | Admitting: Family

## 2013-08-26 NOTE — Telephone Encounter (Signed)
Move patient's appointment up sooner.

## 2013-08-26 NOTE — Telephone Encounter (Signed)
Anibal Henderson from [facility] called to simply inform you that he is not able to engage with the patient yet since she has not been seen by Padonda (appt 12/8).  After being seen he would like her to be referred to [facility] managmnent for [facility] management.  Pt lives alone and has the potential to decline; pt also has a history of anemia and would like for you to follow up on that issue, pt is weak and may have some inbalance concerns. Please advise. Thanks!

## 2013-09-14 ENCOUNTER — Ambulatory Visit: Payer: Medicare Other | Admitting: Family

## 2013-09-14 DIAGNOSIS — Z0289 Encounter for other administrative examinations: Secondary | ICD-10-CM

## 2013-09-23 ENCOUNTER — Encounter: Payer: Self-pay | Admitting: Nurse Practitioner

## 2013-09-23 ENCOUNTER — Ambulatory Visit (INDEPENDENT_AMBULATORY_CARE_PROVIDER_SITE_OTHER): Payer: Medicare Other | Admitting: Nurse Practitioner

## 2013-09-23 VITALS — BP 110/80 | HR 72 | Ht 65.0 in | Wt 161.0 lb

## 2013-09-23 DIAGNOSIS — I5022 Chronic systolic (congestive) heart failure: Secondary | ICD-10-CM

## 2013-09-23 LAB — CBC WITH DIFFERENTIAL/PLATELET
Basophils Absolute: 0.1 10*3/uL (ref 0.0–0.1)
Basophils Relative: 0.7 % (ref 0.0–3.0)
Eosinophils Absolute: 0.1 10*3/uL (ref 0.0–0.7)
Eosinophils Relative: 1.1 % (ref 0.0–5.0)
HCT: 40.8 % (ref 36.0–46.0)
Hemoglobin: 13.2 g/dL (ref 12.0–15.0)
Lymphocytes Relative: 20.8 % (ref 12.0–46.0)
Lymphs Abs: 1.8 10*3/uL (ref 0.7–4.0)
MCHC: 32.4 g/dL (ref 30.0–36.0)
MCV: 79 fl (ref 78.0–100.0)
Monocytes Absolute: 0.6 10*3/uL (ref 0.1–1.0)
Monocytes Relative: 6.9 % (ref 3.0–12.0)
Neutro Abs: 6 10*3/uL (ref 1.4–7.7)
Neutrophils Relative %: 70.5 % (ref 43.0–77.0)
Platelets: 298 10*3/uL (ref 150.0–400.0)
RBC: 5.17 Mil/uL — ABNORMAL HIGH (ref 3.87–5.11)
RDW: 30.5 % — ABNORMAL HIGH (ref 11.5–14.6)
WBC: 8.5 10*3/uL (ref 4.5–10.5)

## 2013-09-23 LAB — BASIC METABOLIC PANEL
BUN: 16 mg/dL (ref 6–23)
CO2: 31 mEq/L (ref 19–32)
Calcium: 9.3 mg/dL (ref 8.4–10.5)
Chloride: 102 mEq/L (ref 96–112)
Creatinine, Ser: 1 mg/dL (ref 0.4–1.2)
GFR: 59.94 mL/min — ABNORMAL LOW (ref >60.00)
Glucose, Bld: 155 mg/dL — ABNORMAL HIGH (ref 70–99)
Potassium: 3.8 mEq/L (ref 3.5–5.1)
Sodium: 140 mEq/L (ref 135–145)

## 2013-09-23 NOTE — Patient Instructions (Addendum)
I would like for you to get some over the counter iron pills and take one a day - this will help with your low blood count  May increase the Lasix back to every day along with your potassium  We will check labs today  See Dr. Johney Frame in March for your device check  Call the [facility] office at [phone] if you have any questions, problems or concerns.

## 2013-09-23 NOTE — Progress Notes (Signed)
[name] Date of Birth: 12/13/1929 Medical Record #[id number]  History of Present Illness: [name] is seen back today for a follow up visit. Seen for Dr. Johney Frame. Former patient of Dr. Elvis Coil. She has CAD with CABG x 2 with MV repair and MAZE in 2009, AF - maintained on Tikosyn, chronic systolic HF with EF of 35% per echo from May of 2014, tachybrady syndrome with PPM in 2009, depression, HLD and HTN.   Most recently in the hospital with worsening dyspnea and chest pressure - found to be profoundly anemia and having an acute systolic HF event. Anemia was felt to be the driving force for her heart failure. Apparently very depressed and lonely - was quite vocal about wanting to die. Was not felt to be suicidal. Her coumadin was stopped. She refused GI evaluation.   Seen a month ago for her post hospital visit. Continued to not be very interested in trying to make any aspect of her life better. No chest pain. Said her breathing was better. Still with "fluid". Taking her medicines but not on iron. Still did not want a GI evaluation. Was not really interested in having further transfusions as well. Kept reiterating that she wants to die and is "ready to go".   Comes back today. Here alone today. Says she is doing ok but still has "fluid" in her upper legs. Weight is up 2 pounds. Not really short of breath. No chest pain. Did not get her iron pills as recommended. Wants to go back on her Lasix every day with her potassium. Still not interested in GI evaluation or any heroic measures.   Current Outpatient Prescriptions  Medication Sig Dispense Refill  . azelastine (ASTELIN) 137 MCG/SPRAY nasal spray Place 1 spray into the nose 2 (two) times daily. Use in each nostril as directed      . Cholecalciferol (VITAMIN D) 2000 UNITS tablet Take 2,000 Units by mouth daily.        Marland Kitchen dofetilide (TIKOSYN) 250 MCG capsule Take 1 capsule (250 mcg total) by mouth every 12 (twelve) hours.  60 capsule  3  .  ezetimibe (ZETIA) 10 MG tablet Take 10 mg by mouth daily.      . furosemide (LASIX) 20 MG tablet Take 1 tablet (20 mg total) by mouth daily.  30 tablet  3  . lisinopril (PRINIVIL,ZESTRIL) 2.5 MG tablet Take 1 tablet (2.5 mg total) by mouth daily.  30 tablet  3  . metoprolol tartrate (LOPRESSOR) 25 MG tablet Take 12.5 mg by mouth 2 (two) times daily.      . pantoprazole (PROTONIX) 40 MG tablet Take 40 mg by mouth daily.      . potassium chloride (K-DUR,KLOR-CON) 10 MEQ tablet Take 1 tablet (10 mEq total) by mouth 2 (two) times daily.  60 tablet  3   No current facility-administered medications for this visit.    Allergies  Allergen Reactions  . Amiodarone     unknown  . Diphenhydramine Hcl Other (See Comments)    unknown    Past Medical History  Diagnosis Date  . Coronary artery disease     a. 1996 s/p PS BMS;  b. 03/2007 NSTEMI/PCI: 3.0x24 and 3.0x28 Taxus DES' t RCA; c. 05/2008 CABG x 2 with MV repair and Maze (LIMA->LAD, VG->PDA)  . Atrial fibrillation     a. in sinus on Tikosyn and coumadin;  b. s/p MAZE @ time of MVR/CABG 05/2008;  c. h/o pulm toxicity while on amio.  Marland Kitchen  Chronic systolic CHF (congestive heart failure)     a. 02/2013 Echo: EF 35% inflat AK, mod to sev calcified MV annulus w/o MS, mod dil LA, PASP .  Marland Kitchen Hypertension   . Anxiety   . Depression   . Hyperlipidemia   . Mitral insufficiency     a. 05/2008 s/p MV repair with [employer] MV ring (model 5200, 26mm ser # [id number].  Marland Kitchen IDA (iron deficiency anemia)   . Subarachnoid hemorrhage     a. in setting of syncope/fall/digoxin toxicity 11/2007.  Marland Kitchen LBBB (left bundle branch block)   . Chronic anticoagulation     coumadin  . High risk medication use     Tikosyn  . Tachycardia-bradycardia     a. 05/2008 s/p MDT P1501DR Enrhythm DC PPM, ser # [id number].    Past Surgical History  Procedure Laterality Date  . Cardiac catheterization  03/19/2007    EF 45%. THERE IS 2+ MITRAL INSUFFICIENCY. THERE IS MODERATE  INFERIOR WALL HYPOKINESIA WITH OVERALL MILD TO MODERATE LV DYSFUNCTION  . Coronary stent placement  1996, AND 03/2007    RIGHT CORONARY  . Removal of colon polyp      AND A LARGE CECAL POLYP  . Coronary artery bypass graft  04/2008    INCLUDING A MITRAL VALVE REPAIR AND A LEFT SIDED MAZE PROCEDURE  . Coronary artery bypass graft      X2 with LIMA to LAD and SVG to PD and WITH A LEFT SIDED MAZE PROCEDURE & MITRAL VALVE REPAIR. THIS INCLUDED AN LIMA GRAFT TO THE LAD, AND A SAPHENOUS VEIN GRAFT TO THE PDA.  Marland Kitchen Transthoracic echocardiogram  06/21/2008    EF 35%  . Pacemaker insertion  05/19/2008    MDT EnRhythm implanted for mobitz II AV block by Dr Reyes Ivan  . Mitral valve repair      History  Smoking status  . Former Smoker  . Quit date: 05/06/1996  Smokeless tobacco  . Never Used    History  Alcohol Use No    Family History  Problem Relation Age of Onset  . Heart attack Mother   . Cancer Father     Review of Systems: The review of systems is per the HPI.  All other systems were reviewed and are negative.  Physical Exam: BP 110/80  Pulse 72  Ht 5\' 5"  (1.651 m)  Wt 161 lb (73.029 kg)  BMI 26.79 kg/m2 Patient is pleasant and in no acute distress. Skin is warm and dry. Color is normal.  HEENT is unremarkable. Normocephalic/atraumatic. PERRL. Sclera are nonicteric. Neck is supple. No masses. No JVD. Lungs are clear. Cardiac exam shows a regular rate and rhythm. Abdomen is soft. Extremities are without edema. Gait and ROM are intact. No gross neurologic deficits noted.  Wt Readings from Last 3 Encounters:  09/23/13 161 lb (73.029 kg)  08/19/13 159 lb 4 oz (72.235 kg)  08/12/13 161 lb 3.2 oz (73.12 kg)     LABORATORY DATA: Pending   Lab Results  Component Value Date   WBC 8.1 08/19/2013   HGB 10.7* 08/19/2013   HCT 34.2* 08/19/2013   PLT 297.0 08/19/2013   GLUCOSE 151* 08/19/2013   CHOL 204* 05/07/2012   TRIG 196.0* 05/07/2012   HDL 40.50 05/07/2012   LDLDIRECT 136.5  05/07/2012   ALT 18 05/07/2012   AST 22 05/07/2012   NA 139 08/19/2013   K 4.1 08/19/2013   CL 104 08/19/2013   CREATININE 0.9 08/19/2013   BUN 25* 08/19/2013  CO2 28 08/19/2013   TSH 0.954 02/21/2013   INR 2.14* 08/10/2013   HGBA1C 5.7* 02/21/2013   Echo Study Conclusions from May 2014  - Left ventricle: There is calcification, thinning, and akinesis of the inferolateral wall. The cavity size was normal. Wall thickness was increased in a pattern of mild LVH. The estimated ejection fraction was 35%. - Aortic valve: Sclerosis without stenosis. No significant regurgitation. - Mitral valve: Moderately to severely calcified annulus. But no significant mitral stenosis. - Left atrium: The atrium was moderately dilated. - Right ventricle: Poorly visualized. The cavity size was normal. Systolic function was normal. - Pulmonary arteries: PA peak pressure: 33mm Hg (S).  Assessment / Plan:  1. Systolic HF - with recent acute exacerbation - EF of 35% - salt use is questionable. I have put her back on her daily Lasix and potassium.  2. Anemia - looks iron deficient - will try again to get her to take some OTC iron. Still not interested in GI evaluation and tells me that she is not going to have further blood transfusions.   3. CAD - no chest pain reported.   4. PAF - holding sinus on Tikosyn.   5. Depression - this has been long standing but does not seem as pronounced today.   Will recheck her labs today. Will try to get her on some OTC iron again. See her back in February/March with Dr. Johney Frame. I will be happy to see back as needed. We will continue with a conservative approach.   Patient is agreeable to this plan and will call if any problems develop in the interim.  [name], RN, ANP-C  [facility]  [address]  [place], [place] [address]   Assessment / Plan: 1.

## 2013-09-24 ENCOUNTER — Telehealth: Payer: Self-pay | Admitting: Nurse Practitioner

## 2013-09-24 NOTE — Telephone Encounter (Signed)
Pt informed of lab results by phone.

## 2013-09-24 NOTE — Telephone Encounter (Signed)
New Problem:  Pt is calling to hear lab results. Requests a call back.

## 2013-10-14 NOTE — Telephone Encounter (Signed)
S/w pt's son stated pt was having trouble breathing and congestion didn't think it was heart related but didn't know if [facility] would handle this issue.  Our office referred pt to see Dr. Orvan Falconerampbell at [facility] in [place], pt went for appt and couldn't find the office, got frustrated and never went back pt's son wanted to know if we couldn't see pt for cold or  could we refer pt again. S/w Norma FredricksonLori Gerhardt and advised to [address] and this could also be pt's PCP. Pt's son was agreeable to plan

## 2013-10-14 NOTE — Telephone Encounter (Signed)
New problem   Pt's son need to speak to [place] concerning his mom.

## 2013-10-16 ENCOUNTER — Emergency Department (HOSPITAL_COMMUNITY)
Admission: EM | Admit: 2013-10-16 | Discharge: 2013-10-16 | Disposition: A | Discharge status: Home / Self Care | Payer: Medicare Other | Attending: Emergency Medicine | Admitting: Emergency Medicine

## 2013-10-16 ENCOUNTER — Emergency Department (HOSPITAL_COMMUNITY): Payer: Medicare Other

## 2013-10-16 ENCOUNTER — Encounter (HOSPITAL_COMMUNITY): Payer: Self-pay | Admitting: Emergency Medicine

## 2013-10-16 DIAGNOSIS — Z8659 Personal history of other mental and behavioral disorders: Secondary | ICD-10-CM | POA: Insufficient documentation

## 2013-10-16 DIAGNOSIS — E7800 Pure hypercholesterolemia, unspecified: Secondary | ICD-10-CM | POA: Insufficient documentation

## 2013-10-16 DIAGNOSIS — Z7901 Long term (current) use of anticoagulants: Secondary | ICD-10-CM | POA: Insufficient documentation

## 2013-10-16 DIAGNOSIS — I10 Essential (primary) hypertension: Secondary | ICD-10-CM | POA: Insufficient documentation

## 2013-10-16 DIAGNOSIS — Z951 Presence of aortocoronary bypass graft: Secondary | ICD-10-CM | POA: Insufficient documentation

## 2013-10-16 DIAGNOSIS — Z79899 Other long term (current) drug therapy: Secondary | ICD-10-CM | POA: Insufficient documentation

## 2013-10-16 DIAGNOSIS — Z87891 Personal history of nicotine dependence: Secondary | ICD-10-CM | POA: Insufficient documentation

## 2013-10-16 DIAGNOSIS — R000 Tachycardia, unspecified: Secondary | ICD-10-CM | POA: Insufficient documentation

## 2013-10-16 DIAGNOSIS — I2510 Atherosclerotic heart disease of native coronary artery without angina pectoris: Secondary | ICD-10-CM | POA: Insufficient documentation

## 2013-10-16 DIAGNOSIS — K8020 Calculus of gallbladder without cholecystitis without obstruction: Secondary | ICD-10-CM | POA: Insufficient documentation

## 2013-10-16 DIAGNOSIS — R109 Unspecified abdominal pain: Secondary | ICD-10-CM

## 2013-10-16 DIAGNOSIS — I5022 Chronic systolic (congestive) heart failure: Secondary | ICD-10-CM | POA: Insufficient documentation

## 2013-10-16 DIAGNOSIS — Z862 Personal history of diseases of the blood and blood-forming organs and certain disorders involving the immune mechanism: Secondary | ICD-10-CM | POA: Insufficient documentation

## 2013-10-16 DIAGNOSIS — Z9861 Coronary angioplasty status: Secondary | ICD-10-CM | POA: Insufficient documentation

## 2013-10-16 LAB — CBC WITH DIFFERENTIAL/PLATELET
Basophils Absolute: 0 10*3/uL (ref 0.0–0.1)
Basophils Relative: 0 % (ref 0–1)
Eosinophils Absolute: 0 10*3/uL (ref 0.0–0.7)
Eosinophils Relative: 0 % (ref 0–5)
HCT: 43.4 % (ref 36.0–46.0)
Hemoglobin: 14.2 g/dL (ref 12.0–15.0)
Lymphocytes Relative: 12 % (ref 12–46)
Lymphs Abs: 1.4 10*3/uL (ref 0.7–4.0)
MCH: 26.4 pg (ref 26.0–34.0)
MCHC: 32.7 g/dL (ref 30.0–36.0)
MCV: 80.7 fL (ref 78.0–100.0)
Monocytes Absolute: 1.2 10*3/uL — ABNORMAL HIGH (ref 0.1–1.0)
Monocytes Relative: 11 % (ref 3–12)
Neutro Abs: 8.7 10*3/uL — ABNORMAL HIGH (ref 1.7–7.7)
Neutrophils Relative %: 77 % (ref 43–77)
Platelets: 358 10*3/uL (ref 150–400)
RBC: 5.38 MIL/uL — ABNORMAL HIGH (ref 3.87–5.11)
RDW: 23.4 % — ABNORMAL HIGH (ref 11.5–15.5)
WBC: 11.3 10*3/uL — ABNORMAL HIGH (ref 4.0–10.5)

## 2013-10-16 LAB — COMPREHENSIVE METABOLIC PANEL
ALT: 6 U/L (ref 0–35)
AST: 14 U/L (ref 0–37)
Albumin: 3.2 g/dL — ABNORMAL LOW (ref 3.5–5.2)
Alkaline Phosphatase: 71 U/L (ref 39–117)
BUN: 22 mg/dL (ref 6–23)
CO2: 22 mEq/L (ref 19–32)
Calcium: 9.4 mg/dL (ref 8.4–10.5)
Chloride: 104 mEq/L (ref 96–112)
Creatinine, Ser: 0.81 mg/dL (ref 0.50–1.10)
GFR calc Af Amer: 77 mL/min — ABNORMAL LOW (ref >90)
GFR calc non Af Amer: 66 mL/min — ABNORMAL LOW (ref >90)
Glucose, Bld: 100 mg/dL — ABNORMAL HIGH (ref 70–99)
Potassium: 3.8 mEq/L (ref 3.7–5.3)
Sodium: 141 mEq/L (ref 137–147)
Total Bilirubin: 0.4 mg/dL (ref 0.3–1.2)
Total Protein: 7.7 g/dL (ref 6.0–8.3)

## 2013-10-16 LAB — LIPASE, BLOOD: Lipase: 29 U/L (ref 11–59)

## 2013-10-16 MED ORDER — SODIUM CHLORIDE 0.9 % IV SOLN
INTRAVENOUS | Status: DC
  Administered 2013-10-16: 09:00:00 via INTRAVENOUS

## 2013-10-16 MED ORDER — ONDANSETRON HCL 4 MG PO TABS: 4.0000 mg | ORAL_TABLET | Freq: Three times a day (TID) | ORAL | Status: DC | PRN

## 2013-10-16 MED ORDER — ONDANSETRON HCL 4 MG/2ML IJ SOLN
4.0000 mg | INTRAMUSCULAR | Status: DC | PRN
  Administered 2013-10-16: 4 mg via INTRAVENOUS
  Filled 2013-10-16: qty 2

## 2013-10-16 MED ORDER — OXYCODONE-ACETAMINOPHEN 5-325 MG PO TABS: 1.0000 | ORAL_TABLET | ORAL | Status: DC | PRN

## 2013-10-16 MED ORDER — MORPHINE SULFATE 4 MG/ML IJ SOLN
4.0000 mg | INTRAMUSCULAR | Status: DC | PRN
  Administered 2013-10-16: 4 mg via INTRAVENOUS
  Filled 2013-10-16: qty 1

## 2013-10-16 NOTE — ED Notes (Signed)
EDP Kohut reports urinalysis not needed prior to discharge.

## 2013-10-16 NOTE — ED Notes (Addendum)
Per EMS pt awoke with RUQ pain 2 hours ago. Pt reports semi productive cough over past week with yellow sputum.

## 2013-10-16 NOTE — ED Notes (Signed)
Bed: WA21 Expected date:  Expected time:  Means of arrival:  Comments: EMS fall  

## 2013-10-16 NOTE — ED Provider Notes (Signed)
CSN: [id number]     Arrival date & time 10/16/13  0808 History   First MD Initiated Contact with Patient 10/16/13 0813     Chief Complaint  Patient presents with  . Abdominal Pain   (Consider location/radiation/quality/duration/timing/severity/associated sxs/prior Treatment) HPI  80 year old female with abdominal pain. Gradual onset about 2-3 hours ago. Pain is the right upper quadrant. Waxes and wanes but does not completely go away. Does not radiate. No appreciable exacerbating or relieving factors. Has not eaten since last night. No fevers or chills. No urinary complaints. No history of similar symptoms. Preceding 2 weeks patient has had URI symptoms with congestion and occasional cough. This has seemed to improve the last 2 days though.  Past Medical History  Diagnosis Date  . Coronary artery disease     a. 1996 s/p PS BMS;  b. 03/2007 NSTEMI/PCI: 3.0x24 and 3.0x28 Taxus DES' t RCA; c. 05/2008 CABG x 2 with MV repair and Maze (LIMA->LAD, VG->PDA)  . Atrial fibrillation     a. in sinus on Tikosyn and coumadin;  b. s/p MAZE @ time of MVR/CABG 05/2008;  c. h/o pulm toxicity while on amio.  . Chronic systolic CHF (congestive heart failure)     a. 02/2013 Echo: EF 35% inflat AK, mod to sev calcified MV annulus w/o MS, mod dil LA, PASP .  Marland Kitchen Hypertension   . Anxiety   . Depression   . Hyperlipidemia   . Mitral insufficiency     a. 05/2008 s/p MV repair with [employer] MV ring (model 5200, 26mm ser # [id number].  Marland Kitchen IDA (iron deficiency anemia)   . Subarachnoid hemorrhage     a. in setting of syncope/fall/digoxin toxicity 11/2007.  Marland Kitchen LBBB (left bundle branch block)   . Chronic anticoagulation     coumadin  . High risk medication use     Tikosyn  . Tachycardia-bradycardia     a. 05/2008 s/p MDT [id number] Enrhythm DC PPM, ser # [id number].   Past Surgical History  Procedure Laterality Date  . Cardiac catheterization  03/19/2007    EF 45%. THERE IS 2+ MITRAL INSUFFICIENCY. THERE IS  MODERATE INFERIOR WALL HYPOKINESIA WITH OVERALL MILD TO MODERATE LV DYSFUNCTION  . Coronary stent placement  1996, AND 03/2007    RIGHT CORONARY  . Removal of colon polyp      AND A LARGE CECAL POLYP  . Coronary artery bypass graft  04/2008    INCLUDING A MITRAL VALVE REPAIR AND A LEFT SIDED MAZE PROCEDURE  . Coronary artery bypass graft      X2 with LIMA to LAD and SVG to PD and WITH A LEFT SIDED MAZE PROCEDURE & MITRAL VALVE REPAIR. THIS INCLUDED AN LIMA GRAFT TO THE LAD, AND A SAPHENOUS VEIN GRAFT TO THE PDA.  Marland Kitchen Transthoracic echocardiogram  06/21/2008    EF 35%  . Pacemaker insertion  05/19/2008    MDT EnRhythm implanted for mobitz II AV block by Dr Reyes Ivan  . Mitral valve repair     Family History  Problem Relation Age of Onset  . Heart attack Mother   . Cancer Father    History  Substance Use Topics  . Smoking status: Former Smoker    Quit date: 05/06/1996  . Smokeless tobacco: Never Used  . Alcohol Use: No   OB History   Grav Para Term Preterm Abortions TAB SAB Ect Mult Living                 Review of Systems  All systems reviewed and negative, other than as noted in HPI.   Allergies  Amiodarone and Diphenhydramine hcl  Home Medications   Current Outpatient Rx  Name  Route  Sig  Dispense  Refill  . azelastine (ASTELIN) 137 MCG/SPRAY nasal spray   Nasal   Place 1 spray into the nose 2 (two) times daily. Use in each nostril as directed         . Cholecalciferol (VITAMIN D) 2000 UNITS tablet   Oral   Take 2,000 Units by mouth daily.           Marland Kitchen. dofetilide (TIKOSYN) 250 MCG capsule   Oral   Take 1 capsule (250 mcg total) by mouth every 12 (twelve) hours.   60 capsule   3   . ezetimibe (ZETIA) 10 MG tablet   Oral   Take 10 mg by mouth daily.         . furosemide (LASIX) 20 MG tablet   Oral   Take 1 tablet (20 mg total) by mouth daily.   30 tablet   3   . lisinopril (PRINIVIL,ZESTRIL) 2.5 MG tablet   Oral   Take 1 tablet (2.5 mg total) by mouth  daily.   30 tablet   3   . metoprolol tartrate (LOPRESSOR) 25 MG tablet   Oral   Take 12.5 mg by mouth 2 (two) times daily.         . pantoprazole (PROTONIX) 40 MG tablet   Oral   Take 40 mg by mouth daily.          BP 123/73  Pulse 101  Temp(Src) 98.3 F (36.8 C) (Oral)  Resp 22  SpO2 93% Physical Exam  Nursing note and vitals reviewed. Constitutional: She appears well-developed and well-nourished. No distress.  HENT:  Head: Normocephalic and atraumatic.  Eyes: Conjunctivae are normal. Right eye exhibits no discharge. Left eye exhibits no discharge.  Neck: Neck supple.  Cardiovascular: Regular rhythm and normal heart sounds.  Exam reveals no gallop and no friction rub.   No murmur heard. Mild tachcycardia  Pulmonary/Chest: Effort normal and breath sounds normal. No respiratory distress.  Abdominal: Soft. She exhibits no distension. There is tenderness.  Right upper quadrant tenderness with voluntary guarding. No rebound tenderness. No distention. No surgical scars noted. Abdominal wall grossly normal in appearance.  Musculoskeletal: She exhibits no edema and no tenderness.  Neurological: She is alert.  Skin: Skin is warm and dry.  Psychiatric: She has a normal mood and affect. Her behavior is normal. Thought content normal.    ED Course  Procedures (including critical care time) Labs Review Labs Reviewed  COMPREHENSIVE METABOLIC PANEL - Abnormal; Notable for the following:    Glucose, Bld 100 (*)    Albumin 3.2 (*)    GFR calc non Af Amer 66 (*)    GFR calc Af Amer 77 (*)    All other components within normal limits  CBC WITH DIFFERENTIAL - Abnormal; Notable for the following:    WBC 11.3 (*)    RBC 5.38 (*)    RDW 23.4 (*)    Neutro Abs 8.7 (*)    Monocytes Absolute 1.2 (*)    All other components within normal limits  LIPASE, BLOOD  URINALYSIS, ROUTINE W REFLEX MICROSCOPIC   Imaging Review Dg Chest 2 View  10/16/2013   CLINICAL DATA:  Cough and  right-sided chest discomfort, history of bronchitis, coronary artery disease, and mitral valve repair.  EXAM: CHEST  2  VIEW  COMPARISON:  PA and lateral chest x-ray of August 10, 2013.  FINDINGS: The lungs are mildly hyperinflated which may reflect underlying COPD or reactive airway disease. There is no pleural effusion or alveolar infiltrate. The cardiac silhouette is normal in size. A mitral valve ring is in place. The patient has undergone previous CABG. A permanent pacemaker is in place. The pulmonary vascularity is not engorged. The observed portions of the bony thorax exhibit no acute abnormalities.  IMPRESSION: 1. There is no evidence of pneumonia or pleural effusion nor pneumothorax. No rib abnormality on the right is demonstrated. There are findings consistent with COPD. One cannot exclude acute bronchitis in the appropriate clinical setting. 2. There is no evidence of CHF. There are post CABG changes as well as evidence of previous mitral valve repair.   Electronically Signed   By: David  [place]   On: 10/16/2013 09:15   US Abdomen Limited  10/16/2013   CLINICAL DATA:  Right upper quadrant pain  EXAM: US ABDOMEN LIMITED - RIGHT UPPER QUADRANT  COMPARISON:  None.  FINDINGS: Gallbladder  There are gallstones. There is no gallbladder wall thickening. No sonographic Murphy sign noted.  Common bile duct  Diameter: 4.7 mm  Liver:  No focal lesion identified. Within normal limits in parenchymal echogenicity.  IMPRESSION: Cholelithiasis without sonographic evidence of acute cholecystitis.   Electronically Signed   By: Sherian Rein M.D.   On: 10/16/2013 10:00    EKG Interpretation   None       MDM   1. Cholelithiasis   2. Abdominal pain     80 year old female with right upper quadrant pain. Suspect biliary colic. Conceivably could be a right lower lobe pneumonia, but I feel clinically less likely. IV access. Pain medication. Labs including LFTs and lipase. Right upper quadrant ultrasound.  10:46  AM Ultrasound with cholelithiasis. No additional ultrasonographic findings to suggest cholecystitis. Recheck reveals the patient is currently pain-free. Abdominal exam is benign. Labs remain pending. Unless markedly abnormal, if remains asymptomatic plan discharge with as needed pain medication and surgical followup.  12:17 PM Normal LFTs/lipase. Minimal leukocytosis, but this is nonspecific. Patient continues to endorse that her pain has resolved. She has no tenderness on re-exam. I feel she is appropriate for outpatient surgical followup. Information for [facility] was provided. Prescriptions for as needed pain medication and anti-emetic. Emergent return precautions discussed.Marland Kitchen  Raeford Razor, MD 10/16/13 [phone]

## 2013-10-16 NOTE — Discharge Instructions (Signed)

## 2013-10-16 NOTE — ED Notes (Addendum)
Pt states she does not want anything for pain at present time. Pt aware to let us know if she would like something for pain.

## 2013-10-28 ENCOUNTER — Ambulatory Visit (INDEPENDENT_AMBULATORY_CARE_PROVIDER_SITE_OTHER): Payer: BC Managed Care – HMO | Admitting: General Surgery

## 2013-11-09 ENCOUNTER — Telehealth: Payer: Self-pay | Admitting: *Deleted

## 2013-11-09 ENCOUNTER — Encounter: Payer: Self-pay | Admitting: Internal Medicine

## 2013-11-09 ENCOUNTER — Ambulatory Visit (INDEPENDENT_AMBULATORY_CARE_PROVIDER_SITE_OTHER): Payer: Medicare Other | Admitting: Internal Medicine

## 2013-11-09 VITALS — BP 100/62 | HR 72 | Ht 65.0 in | Wt 153.0 lb

## 2013-11-09 DIAGNOSIS — I498 Other specified cardiac arrhythmias: Secondary | ICD-10-CM

## 2013-11-09 DIAGNOSIS — I4891 Unspecified atrial fibrillation: Secondary | ICD-10-CM

## 2013-11-09 DIAGNOSIS — I509 Heart failure, unspecified: Secondary | ICD-10-CM

## 2013-11-09 DIAGNOSIS — I10 Essential (primary) hypertension: Secondary | ICD-10-CM | POA: Insufficient documentation

## 2013-11-09 DIAGNOSIS — I2510 Atherosclerotic heart disease of native coronary artery without angina pectoris: Secondary | ICD-10-CM

## 2013-11-09 DIAGNOSIS — I5022 Chronic systolic (congestive) heart failure: Secondary | ICD-10-CM

## 2013-11-09 LAB — MDC_IDC_ENUM_SESS_TYPE_INCLINIC
Battery Voltage: 2.93 V
Brady Statistic RV Percent Paced: 0.03 %
Date Time Interrogation Session: 20150202130031
Lead Channel Pacing Threshold Amplitude: 1 V
Lead Channel Pacing Threshold Pulse Width: 0.4 ms
Lead Channel Pacing Threshold Pulse Width: 1 ms
Lead Channel Sensing Intrinsic Amplitude: 20.3 mV
Lead Channel Setting Pacing Pulse Width: 0.4 ms
MDC IDC MSMT LEADCHNL RA IMPEDANCE VALUE: 376 Ohm
MDC IDC MSMT LEADCHNL RA PACING THRESHOLD AMPLITUDE: 2.5 V
MDC IDC MSMT LEADCHNL RA SENSING INTR AMPL: 1.4 mV
MDC IDC MSMT LEADCHNL RV IMPEDANCE VALUE: 536 Ohm
MDC IDC SET LEADCHNL RA PACING AMPLITUDE: 4 V
MDC IDC SET LEADCHNL RV PACING AMPLITUDE: 2 V
MDC IDC SET LEADCHNL RV SENSING SENSITIVITY: 0.9 mV
MDC IDC SET ZONE DETECTION INTERVAL: 350 ms
MDC IDC SET ZONE DETECTION INTERVAL: 360 ms
MDC IDC STAT BRADY AP VP PERCENT: 0.01 %
MDC IDC STAT BRADY AP VS PERCENT: 1.5 %
MDC IDC STAT BRADY AS VP PERCENT: 0.02 %
MDC IDC STAT BRADY AS VS PERCENT: 98.48 %
MDC IDC STAT BRADY RA PERCENT PACED: 1.5 %

## 2013-11-09 NOTE — Telephone Encounter (Signed)
[facility]

## 2013-11-09 NOTE — Patient Instructions (Addendum)
Your physician wants you to follow-up in: 6 months with Lori Gerhardt, NP and 12 months with Dr Allred You will receive a reminder letter in the mail two months in advance. If you don't receive a letter, please call our office to schedule the follow-up appointment.  

## 2013-11-09 NOTE — Progress Notes (Signed)
PCP:  Maryelizabeth Rowan, MD Primary Cardiologist:  Dr [place]  The patient presents today for routine electrophysiology followup.  She has a h/o atrial fibrillation and bradycardia s/p PPM implantation (MDT) implantation 05/19/2008 by Dr Reyes Ivan.  She has recently had pneumonia but continues to improve with this  She also previously had anemia for which she declined workup.  Today, she denies symptoms of palpitations, chest pain, shortness of breath, orthopnea, PND, lower extremity edema, dizziness, presyncope, syncope, or neurologic sequela. The patient is tolerating medications without difficulties and is otherwise without complaint today.    Past Medical History  Diagnosis Date  . Coronary artery disease     a. 1996 s/p PS BMS;  b. 03/2007 NSTEMI/PCI: 3.0x24 and 3.0x28 Taxus DES' t RCA; c. 05/2008 CABG x 2 with MV repair and Maze (LIMA->LAD, VG->PDA)  . Atrial fibrillation     a. in sinus on Tikosyn and coumadin;  b. s/p MAZE @ time of MVR/CABG 05/2008;  c. h/o pulm toxicity while on amio.  . Chronic systolic CHF (congestive heart failure)     a. 02/2013 Echo: EF 35% inflat AK, mod to sev calcified MV annulus w/o MS, mod dil LA, PASP .  Marland Kitchen Hypertension   . Anxiety   . Depression   . Hyperlipidemia   . Mitral insufficiency     a. 05/2008 s/p MV repair with [employer] MV ring (model 5200, 26mm ser # [id number].  Marland Kitchen IDA (iron deficiency anemia)   . Subarachnoid hemorrhage     a. in setting of syncope/fall/digoxin toxicity 11/2007.  Marland Kitchen LBBB (left bundle branch block)   . Chronic anticoagulation     coumadin  . High risk medication use     Tikosyn  . Tachycardia-bradycardia     a. 05/2008 s/p MDT [id number] Enrhythm DC PPM, ser # [id number] H.   Past Surgical History  Procedure Laterality Date  . Cardiac catheterization  03/19/2007    EF 45%. THERE IS 2+ MITRAL INSUFFICIENCY. THERE IS MODERATE INFERIOR WALL HYPOKINESIA WITH OVERALL MILD TO MODERATE LV DYSFUNCTION  . Coronary stent placement   1996, AND 03/2007    RIGHT CORONARY  . Removal of colon polyp      AND A LARGE CECAL POLYP  . Coronary artery bypass graft  04/2008    INCLUDING A MITRAL VALVE REPAIR AND A LEFT SIDED MAZE PROCEDURE  . Coronary artery bypass graft      X2 with LIMA to LAD and SVG to PD and WITH A LEFT SIDED MAZE PROCEDURE & MITRAL VALVE REPAIR. THIS INCLUDED AN LIMA GRAFT TO THE LAD, AND A SAPHENOUS VEIN GRAFT TO THE PDA.  Marland Kitchen Transthoracic echocardiogram  06/21/2008    EF 35%  . Pacemaker insertion  05/19/2008    MDT EnRhythm implanted for mobitz II AV block by Dr Reyes Ivan  . Mitral valve repair      Current Outpatient Prescriptions  Medication Sig Dispense Refill  . Cholecalciferol (VITAMIN D) 2000 UNITS tablet Take 2,000 Units by mouth daily.        Marland Kitchen dofetilide (TIKOSYN) 250 MCG capsule Take 1 capsule (250 mcg total) by mouth every 12 (twelve) hours.  60 capsule  3  . ezetimibe (ZETIA) 10 MG tablet Take 10 mg by mouth daily.      . furosemide (LASIX) 20 MG tablet Take 1 tablet (20 mg total) by mouth daily.  30 tablet  3  . lisinopril (PRINIVIL,ZESTRIL) 2.5 MG tablet Take 1 tablet (2.5 mg total) by mouth daily.  30  tablet  3  . metoprolol tartrate (LOPRESSOR) 25 MG tablet Take 12.5 mg by mouth 2 (two) times daily.      . pantoprazole (PROTONIX) 40 MG tablet Take 40 mg by mouth daily.      . potassium chloride (K-DUR,KLOR-CON) 10 MEQ tablet Take 10 mEq by mouth once.       No current facility-administered medications for this visit.    Allergies  Allergen Reactions  . Amiodarone     unknown  . Diphenhydramine Hcl Other (See Comments)    unknown    History   Social History  . Marital Status: Married    Spouse Name: N/A    Number of Children: N/A  . Years of Education: N/A   Occupational History  . Not on file.   Social History Main Topics  . Smoking status: Former Smoker    Quit date: 05/06/1996  . Smokeless tobacco: Never Used  . Alcohol Use: No  . Drug Use: No  . Sexual Activity: No    Other Topics Concern  . Not on file   Social History Narrative  . No narrative on file    Family History  Problem Relation Age of Onset  . Heart attack Mother   . Cancer Father    Physical Exam: Filed Vitals:   11/09/13 1140  BP: 100/62  Pulse: 72  Height: 5\' 5"  (1.651 m)  Weight: 153 lb (69.4 kg)    GEN- The patient is well appearing, alert and oriented x 3 today.   Head- normocephalic, atraumatic Eyes-  Sclera clear, conjunctiva pink Ears- hearing intact Oropharynx- clear Neck- supple Lungs- Clear to ausculation bilaterally, normal work of breathing Chest- pacemaker pocket is well healed Heart- Regular rate and rhythm, no murmurs, rubs or gallops, PMI not laterally displaced GI- soft, NT, ND, + BS Extremities- no clubbing, cyanosis, or edema  Pacemaker interrogation- reviewed in detail today,  See PACEART report Declines EKG today (ekg 11/14 is reviewed) Labs from 1/15 reviewed  Assessment and Plan:  1. Sick sinus syndrome Normal pacemaker function See Pace Art report No changes today  2. afib Well controlled Off of anticoagulation due to recent anemia Consider restarting anticoagulation upon return if anemia remains stable  3. CAD Stable No change required today  4. HTN Stable No change required today  5. Chronic systolic dysfunction Appears euvolemic No changes today Further medicine therapy is limited by blood pressure  Return to see Lawson FiscalLori in 6 months I will see in a year She is clear in her preference for conservative approach.

## 2013-11-16 ENCOUNTER — Other Ambulatory Visit: Payer: Self-pay | Admitting: Cardiology

## 2013-11-23 ENCOUNTER — Emergency Department (HOSPITAL_COMMUNITY)
Admission: EM | Admit: 2013-11-23 | Discharge: 2013-11-23 | Disposition: A | Discharge status: Home / Self Care | Payer: Medicare Other | Attending: Emergency Medicine | Admitting: Emergency Medicine

## 2013-11-23 ENCOUNTER — Encounter (HOSPITAL_COMMUNITY): Payer: Self-pay | Admitting: Emergency Medicine

## 2013-11-23 ENCOUNTER — Encounter: Payer: Self-pay | Admitting: Internal Medicine

## 2013-11-23 ENCOUNTER — Emergency Department (HOSPITAL_COMMUNITY): Payer: Medicare Other

## 2013-11-23 DIAGNOSIS — F411 Generalized anxiety disorder: Secondary | ICD-10-CM | POA: Insufficient documentation

## 2013-11-23 DIAGNOSIS — Z9861 Coronary angioplasty status: Secondary | ICD-10-CM | POA: Insufficient documentation

## 2013-11-23 DIAGNOSIS — I2510 Atherosclerotic heart disease of native coronary artery without angina pectoris: Secondary | ICD-10-CM | POA: Insufficient documentation

## 2013-11-23 DIAGNOSIS — J40 Bronchitis, not specified as acute or chronic: Secondary | ICD-10-CM

## 2013-11-23 DIAGNOSIS — Z7901 Long term (current) use of anticoagulants: Secondary | ICD-10-CM | POA: Insufficient documentation

## 2013-11-23 DIAGNOSIS — Z79899 Other long term (current) drug therapy: Secondary | ICD-10-CM | POA: Insufficient documentation

## 2013-11-23 DIAGNOSIS — F3289 Other specified depressive episodes: Secondary | ICD-10-CM | POA: Insufficient documentation

## 2013-11-23 DIAGNOSIS — I4891 Unspecified atrial fibrillation: Secondary | ICD-10-CM | POA: Insufficient documentation

## 2013-11-23 DIAGNOSIS — E785 Hyperlipidemia, unspecified: Secondary | ICD-10-CM | POA: Insufficient documentation

## 2013-11-23 DIAGNOSIS — R0600 Dyspnea, unspecified: Secondary | ICD-10-CM

## 2013-11-23 DIAGNOSIS — I5022 Chronic systolic (congestive) heart failure: Secondary | ICD-10-CM | POA: Insufficient documentation

## 2013-11-23 DIAGNOSIS — F329 Major depressive disorder, single episode, unspecified: Secondary | ICD-10-CM | POA: Insufficient documentation

## 2013-11-23 DIAGNOSIS — Z87828 Personal history of other (healed) physical injury and trauma: Secondary | ICD-10-CM | POA: Insufficient documentation

## 2013-11-23 DIAGNOSIS — Z951 Presence of aortocoronary bypass graft: Secondary | ICD-10-CM | POA: Insufficient documentation

## 2013-11-23 DIAGNOSIS — Z950 Presence of cardiac pacemaker: Secondary | ICD-10-CM | POA: Insufficient documentation

## 2013-11-23 DIAGNOSIS — J209 Acute bronchitis, unspecified: Secondary | ICD-10-CM | POA: Insufficient documentation

## 2013-11-23 DIAGNOSIS — Z862 Personal history of diseases of the blood and blood-forming organs and certain disorders involving the immune mechanism: Secondary | ICD-10-CM | POA: Insufficient documentation

## 2013-11-23 DIAGNOSIS — F17200 Nicotine dependence, unspecified, uncomplicated: Secondary | ICD-10-CM | POA: Insufficient documentation

## 2013-11-23 DIAGNOSIS — IMO0002 Reserved for concepts with insufficient information to code with codable children: Secondary | ICD-10-CM | POA: Insufficient documentation

## 2013-11-23 DIAGNOSIS — R000 Tachycardia, unspecified: Secondary | ICD-10-CM | POA: Insufficient documentation

## 2013-11-23 DIAGNOSIS — Z8701 Personal history of pneumonia (recurrent): Secondary | ICD-10-CM | POA: Insufficient documentation

## 2013-11-23 DIAGNOSIS — I10 Essential (primary) hypertension: Secondary | ICD-10-CM | POA: Insufficient documentation

## 2013-11-23 LAB — CBC WITH DIFFERENTIAL/PLATELET
Basophils Absolute: 0 10*3/uL (ref 0.0–0.1)
Basophils Relative: 0 % (ref 0–1)
Eosinophils Absolute: 0.1 K/uL (ref 0.0–0.7)
Eosinophils Relative: 0 % (ref 0–5)
HCT: 41.3 % (ref 36.0–46.0)
Hemoglobin: 13.6 g/dL (ref 12.0–15.0)
Lymphocytes Relative: 13 % (ref 12–46)
Lymphs Abs: 1.8 10*3/uL (ref 0.7–4.0)
MCH: 28.3 pg (ref 26.0–34.0)
MCHC: 32.9 g/dL (ref 30.0–36.0)
MCV: 86 fL (ref 78.0–100.0)
Monocytes Absolute: 0.7 10*3/uL (ref 0.1–1.0)
Monocytes Relative: 5 % (ref 3–12)
Neutro Abs: 10.8 10*3/uL — ABNORMAL HIGH (ref 1.7–7.7)
Neutrophils Relative %: 81 % — ABNORMAL HIGH (ref 43–77)
Platelets: 260 K/uL (ref 150–400)
RBC: 4.8 MIL/uL (ref 3.87–5.11)
RDW: 19.2 % — ABNORMAL HIGH (ref 11.5–15.5)
WBC: 13.3 10*3/uL — ABNORMAL HIGH (ref 4.0–10.5)

## 2013-11-23 LAB — URINALYSIS, ROUTINE W REFLEX MICROSCOPIC
Bilirubin Urine: NEGATIVE
Glucose, UA: NEGATIVE mg/dL
Ketones, ur: NEGATIVE mg/dL
Nitrite: NEGATIVE
Protein, ur: NEGATIVE mg/dL
Specific Gravity, Urine: 1.018 (ref 1.005–1.030)
Urobilinogen, UA: 0.2 mg/dL (ref 0.0–1.0)
pH: 7 (ref 5.0–8.0)

## 2013-11-23 LAB — COMPREHENSIVE METABOLIC PANEL WITH GFR
Albumin: 3.5 g/dL (ref 3.5–5.2)
BUN: 16 mg/dL (ref 6–23)
GFR calc non Af Amer: 79 mL/min — ABNORMAL LOW (ref >90)
Sodium: 139 meq/L (ref 137–147)
Total Bilirubin: 0.3 mg/dL (ref 0.3–1.2)
Total Protein: 7.8 g/dL (ref 6.0–8.3)

## 2013-11-23 LAB — PRO B NATRIURETIC PEPTIDE: Pro B Natriuretic peptide (BNP): 2837 pg/mL — ABNORMAL HIGH (ref 0–450)

## 2013-11-23 LAB — COMPREHENSIVE METABOLIC PANEL
ALT: 11 U/L (ref 0–35)
AST: 18 U/L (ref 0–37)
Alkaline Phosphatase: 77 U/L (ref 39–117)
CO2: 22 mEq/L (ref 19–32)
Calcium: 9.4 mg/dL (ref 8.4–10.5)
Chloride: 103 mEq/L (ref 96–112)
Creatinine, Ser: 0.71 mg/dL (ref 0.50–1.10)
GFR calc Af Amer: >90 mL/min (ref >90)
Glucose, Bld: 148 mg/dL — ABNORMAL HIGH (ref 70–99)
Potassium: 4.2 mEq/L (ref 3.7–5.3)

## 2013-11-23 LAB — POCT I-STAT 3, VENOUS BLOOD GAS (G3P V)
Acid-base deficit: 1 mmol/L (ref 0.0–2.0)
Bicarbonate: 23 meq/L (ref 20.0–24.0)
O2 Saturation: 97 %
TCO2: 24 mmol/L (ref 0–100)
pCO2, Ven: 35.8 mmHg — ABNORMAL LOW (ref 45.0–50.0)
pH, Ven: 7.417 — ABNORMAL HIGH (ref 7.250–7.300)
pO2, Ven: 88 mmHg — ABNORMAL HIGH (ref 30.0–45.0)

## 2013-11-23 LAB — CG4 I-STAT (LACTIC ACID): Lactic Acid, Venous: 1.52 mmol/L (ref 0.5–2.2)

## 2013-11-23 LAB — URINE MICROSCOPIC-ADD ON

## 2013-11-23 LAB — TROPONIN I: Troponin I: <0.3 ng/mL (ref <0.30)

## 2013-11-23 MED ORDER — ALBUTEROL SULFATE (2.5 MG/3ML) 0.083% IN NEBU
5.0000 mg | INHALATION_SOLUTION | Freq: Once | RESPIRATORY_TRACT | Status: AC
  Administered 2013-11-23: 5 mg via RESPIRATORY_TRACT

## 2013-11-23 MED ORDER — ALBUTEROL SULFATE (2.5 MG/3ML) 0.083% IN NEBU
5.0000 mg | INHALATION_SOLUTION | Freq: Once | RESPIRATORY_TRACT | Status: AC
  Administered 2013-11-23: 5 mg via RESPIRATORY_TRACT
  Filled 2013-11-23: qty 6

## 2013-11-23 MED ORDER — IPRATROPIUM BROMIDE 0.02 % IN SOLN
RESPIRATORY_TRACT | Status: AC
  Administered 2013-11-23: 08:00:00 0.5 mg via RESPIRATORY_TRACT
  Filled 2013-11-23: qty 2.5

## 2013-11-23 MED ORDER — AMOXICILLIN 500 MG PO CAPS
500.0000 mg | ORAL_CAPSULE | Freq: Once | ORAL | Status: AC
  Administered 2013-11-23: 500 mg via ORAL
  Filled 2013-11-23: qty 1

## 2013-11-23 MED ORDER — PREDNISONE 20 MG PO TABS: 40.0000 mg | ORAL_TABLET | Freq: Every day | ORAL | Status: DC

## 2013-11-23 MED ORDER — ALBUTEROL SULFATE (2.5 MG/3ML) 0.083% IN NEBU
INHALATION_SOLUTION | RESPIRATORY_TRACT | Status: AC
  Filled 2013-11-23: qty 3

## 2013-11-23 MED ORDER — ALBUTEROL SULFATE (2.5 MG/3ML) 0.083% IN NEBU
INHALATION_SOLUTION | RESPIRATORY_TRACT | Status: AC
  Administered 2013-11-23: 08:00:00 5 mg via RESPIRATORY_TRACT
  Filled 2013-11-23: qty 6

## 2013-11-23 MED ORDER — LORAZEPAM 2 MG/ML IJ SOLN
0.5000 mg | Freq: Once | INTRAMUSCULAR | Status: AC
  Administered 2013-11-23: 0.5 mg via INTRAVENOUS
  Filled 2013-11-23: qty 1

## 2013-11-23 MED ORDER — ALBUTEROL SULFATE HFA 108 (90 BASE) MCG/ACT IN AERS
1.0000 | INHALATION_SPRAY | Freq: Four times a day (QID) | RESPIRATORY_TRACT | Status: DC | PRN
  Administered 2013-11-23: 2 via RESPIRATORY_TRACT
  Filled 2013-11-23: qty 6.7

## 2013-11-23 MED ORDER — IPRATROPIUM BROMIDE 0.02 % IN SOLN
0.5000 mg | Freq: Once | RESPIRATORY_TRACT | Status: AC
  Administered 2013-11-23: 0.5 mg via RESPIRATORY_TRACT

## 2013-11-23 MED ORDER — AMOXICILLIN-POT CLAVULANATE 875-125 MG PO TABS: 1.0000 | ORAL_TABLET | Freq: Two times a day (BID) | ORAL | Status: DC

## 2013-11-23 MED ORDER — ALBUTEROL SULFATE (2.5 MG/3ML) 0.083% IN NEBU
2.5000 mg | INHALATION_SOLUTION | Freq: Once | RESPIRATORY_TRACT | Status: AC
  Administered 2013-11-23: 2.5 mg via RESPIRATORY_TRACT

## 2013-11-23 NOTE — ED Notes (Signed)
Pt son to pick up patient.

## 2013-11-23 NOTE — Discharge Instructions (Signed)
Bronchitis °Bronchitis is inflammation of the airways that extend from the windpipe into the lungs (bronchi). The inflammation often causes mucus to develop, which leads to a cough. If the inflammation becomes severe, it may cause shortness of breath. °CAUSES  °Bronchitis may be caused by:  °· Viral infections.   °· Bacteria.   °· Cigarette smoke.   °· Allergens, pollutants, and other irritants.   °SIGNS AND SYMPTOMS  °The most common symptom of bronchitis is a frequent cough that produces mucus. Other symptoms include: °· Fever.   °· Body aches.   °· Chest congestion.   °· Chills.   °· Shortness of breath.   °· Sore throat.   °DIAGNOSIS  °Bronchitis is usually diagnosed through a medical history and physical exam. Tests, such as chest X-rays, are sometimes done to rule out other conditions.  °TREATMENT  °You may need to avoid contact with whatever caused the problem (smoking, for example). Medicines are sometimes needed. These may include: °· Antibiotics. These may be prescribed if the condition is caused by bacteria. °· Cough suppressants. These may be prescribed for relief of cough symptoms.   °· Inhaled medicines. These may be prescribed to help open your airways and make it easier for you to breathe.   °· Steroid medicines. These may be prescribed for those with recurrent (chronic) bronchitis. °HOME CARE INSTRUCTIONS °· Get plenty of rest.   °· Drink enough fluids to keep your urine clear or pale yellow (unless you have a medical condition that requires fluid restriction). Increasing fluids may help thin your secretions and will prevent dehydration.   °· Only take over-the-counter or prescription medicines as directed by your health care provider. °· Only take antibiotics as directed. Make sure you finish them even if you start to feel better. °· Avoid secondhand smoke, irritating chemicals, and strong fumes. These will make bronchitis worse. If you are a smoker, quit smoking. Consider using nicotine gum or  skin patches to help control withdrawal symptoms. Quitting smoking will help your lungs heal faster.   °· Put a cool-mist humidifier in your bedroom at night to moisten the air. This may help loosen mucus. Change the water in the humidifier daily. You can also run the hot water in your shower and sit in the bathroom with the door closed for 5 10 minutes.   °· Follow up with your health care provider as directed.   °· Wash your hands frequently to avoid catching bronchitis again or spreading an infection to others.   °SEEK MEDICAL CARE IF: °Your symptoms do not improve after 1 week of treatment.  °SEEK IMMEDIATE MEDICAL CARE IF: °· Your fever increases. °· You have chills.   °· You have chest pain.   °· You have worsening shortness of breath.   °· You have bloody sputum. °· You faint.   °· You have lightheadedness. °· You have a severe headache.   °· You vomit repeatedly. °MAKE SURE YOU:  °· Understand these instructions. °· Will watch your condition. °· Will get help right away if you are not doing well or get worse. °Document Released: 09/24/2005 Document Revised: 07/15/2013 Document Reviewed: 05/19/2013 °ExitCare® Patient Information ©2014 [employer]. ° °Upper Respiratory Infection, Adult °An upper respiratory infection (URI) is also sometimes known as the common cold. The upper respiratory tract includes the nose, sinuses, throat, trachea, and bronchi. Bronchi are the airways leading to the lungs. Most people improve within 1 week, but symptoms can last up to 2 weeks. A residual cough may last even longer.  °CAUSES °Many different viruses can infect the tissues lining the upper respiratory tract. The tissues become irritated   and inflamed and often become very moist. Mucus production is also common. A cold is contagious. You can easily spread the virus to others by oral contact. This includes kissing, sharing a glass, coughing, or sneezing. Touching your mouth or nose and then touching a surface, which is then  touched by another person, can also spread the virus. °SYMPTOMS  °Symptoms typically develop 1 to 3 days after you come in contact with a cold virus. Symptoms vary from person to person. They may include: °· Runny nose. °· Sneezing. °· Nasal congestion. °· Sinus irritation. °· Sore throat. °· Loss of voice (laryngitis). °· Cough. °· Fatigue. °· Muscle aches. °· Loss of appetite. °· Headache. °· Low-grade fever. °DIAGNOSIS  °You might diagnose your own cold based on familiar symptoms, since most people get a cold 2 to 3 times a year. Your caregiver can confirm this based on your exam. Most importantly, your caregiver can check that your symptoms are not due to another disease such as strep throat, sinusitis, pneumonia, asthma, or epiglottitis. Blood tests, throat tests, and X-rays are not necessary to diagnose a common cold, but they may sometimes be helpful in excluding other more serious diseases. Your caregiver will decide if any further tests are required. °RISKS AND COMPLICATIONS  °You may be at risk for a more severe case of the common cold if you smoke cigarettes, have chronic heart disease (such as heart failure) or lung disease (such as asthma), or if you have a weakened immune system. The very young and very old are also at risk for more serious infections. Bacterial sinusitis, middle ear infections, and bacterial pneumonia can complicate the common cold. The common cold can worsen asthma and chronic obstructive pulmonary disease (COPD). Sometimes, these complications can require emergency medical care and may be life-threatening. °PREVENTION  °The best way to protect against getting a cold is to practice good hygiene. Avoid oral or hand contact with people with cold symptoms. Wash your hands often if contact occurs. There is no clear evidence that vitamin C, vitamin E, echinacea, or exercise reduces the chance of developing a cold. However, it is always recommended to get plenty of rest and practice good  nutrition. °TREATMENT  °Treatment is directed at relieving symptoms. There is no cure. Antibiotics are not effective, because the infection is caused by a virus, not by bacteria. Treatment may include: °· Increased fluid intake. Sports drinks offer valuable electrolytes, sugars, and fluids. °· Breathing heated mist or steam (vaporizer or shower). °· Eating chicken soup or other clear broths, and maintaining good nutrition. °· Getting plenty of rest. °· Using gargles or lozenges for comfort. °· Controlling fevers with ibuprofen or acetaminophen as directed by your caregiver. °· Increasing usage of your inhaler if you have asthma. °Zinc gel and zinc lozenges, taken in the first 24 hours of the common cold, can shorten the duration and lessen the severity of symptoms. Pain medicines may help with fever, muscle aches, and throat pain. A variety of non-prescription medicines are available to treat congestion and runny nose. Your caregiver can make recommendations and may suggest nasal or lung inhalers for other symptoms.  °HOME CARE INSTRUCTIONS  °· Only take over-the-counter or prescription medicines for pain, discomfort, or fever as directed by your caregiver. °· Use a warm mist humidifier or inhale steam from a shower to increase air moisture. This may keep secretions moist and make it easier to breathe. °· Drink enough water and fluids to keep your urine clear or pale   yellow. °· Rest as needed. °· Return to work when your temperature has returned to normal or as your caregiver advises. You may need to stay home longer to avoid infecting others. You can also use a face mask and careful hand washing to prevent spread of the virus. °SEEK MEDICAL CARE IF:  °· After the first few days, you feel you are getting worse rather than better. °· You need your caregiver's advice about medicines to control symptoms. °· You develop chills, worsening shortness of breath, or brown or red sputum. These may be signs of  pneumonia. °· You develop yellow or brown nasal discharge or pain in the face, especially when you bend forward. These may be signs of sinusitis. °· You develop a fever, swollen neck glands, pain with swallowing, or white areas in the back of your throat. These may be signs of strep throat. °SEEK IMMEDIATE MEDICAL CARE IF:  °· You have a fever. °· You develop severe or persistent headache, ear pain, sinus pain, or chest pain. °· You develop wheezing, a prolonged cough, cough up blood, or have a change in your usual mucus (if you have chronic lung disease). °· You develop sore muscles or a stiff neck. °Document Released: 03/20/2001 Document Revised: 12/17/2011 Document Reviewed: 01/26/2011 °ExitCare® Patient Information ©2014 [employer]. ° °

## 2013-11-23 NOTE — ED Notes (Signed)
Pt recently tx for pnx to ED via EMS c/o chest pressure an sob.  Pt denies pain.  C/o clear, productive cough.

## 2013-11-23 NOTE — ED Notes (Signed)
RT called

## 2013-11-23 NOTE — Progress Notes (Signed)
**Note De-Identified  Obfuscation** Adult wheeze protocol note: patient had poor effort with spirometry,  BP 108/65, HR 108, RR 20, SAT 96% on 3L [place],  BBS expiratory wheeze.  Albuterol 2.5mg  HHN given.  RT to continue to monitor.

## 2013-11-23 NOTE — ED Notes (Signed)
Gretta Beganeter Rosier (Son) called at cell [phone]. Will pick up mother from ED, ETA 1330.

## 2013-11-23 NOTE — ED Notes (Signed)
[employer] responded and stated no irregularities with pacemaker.

## 2013-11-23 NOTE — ED Notes (Signed)
Pt upset that son doesn't know she was here. States "he is suppose to be called anytime that I come here." Explained to pt that we have him as an emergency contact, but that he hasn't been contacted. Pt also wanting to be admitted. Md Ghim at bedside explaining that pt doesn't need admission.

## 2013-11-23 NOTE — ED Notes (Signed)
Rt at bedside.  Pt states she feels MUCH better and has been able to sleep.

## 2013-11-23 NOTE — ED Notes (Signed)
Pacemaker interrogated and information is being sent.

## 2013-11-23 NOTE — ED Provider Notes (Signed)
CSN: [id number]     Arrival date & time 11/23/13  0711 History   First MD Initiated Contact with Patient 11/23/13 [phone]     Chief Complaint  Patient presents with  . Chest Pain  . Cough     (Consider location/radiation/quality/duration/timing/severity/associated sxs/prior Treatment) HPI Comments: Pt reported to RN that she was treated for pneumonia recently.  No record of it in [facility].  Pt with likely h/o COPD and CAD although pt reports she doesn't know anything about her history ("they don't tell you anything, they just do it").  No N/V.  She told RN she had chest tightness, but not to me.  She tells me no CP.  She has had productive cough and keeps saying "I can't breathe" repeatedly.    Level 5 caveat due to patient condition.  Patient is a 80 y.o. female presenting with shortness of breath. The history is provided by the patient. The history is limited by the condition of the patient.  Shortness of Breath Severity:  Severe Onset quality:  Gradual Duration: Pt cannot specifiy. Timing:  Constant Progression:  Worsening Chronicity:  Recurrent Relieved by:  Nothing Worsened by:  Nothing tried Ineffective treatments:  None tried Associated symptoms: cough   Associated symptoms: no abdominal pain, no syncope and no vomiting   Associated symptoms comment:  Chest tightness told to RN, denied to me Risk factors: tobacco use     Past Medical History  Diagnosis Date  . Coronary artery disease     a. 1996 s/p PS BMS;  b. 03/2007 NSTEMI/PCI: 3.0x24 and 3.0x28 Taxus DES' t RCA; c. 05/2008 CABG x 2 with MV repair and Maze (LIMA->LAD, VG->PDA)  . Atrial fibrillation     a. in sinus on Tikosyn and coumadin;  b. s/p MAZE @ time of MVR/CABG 05/2008;  c. h/o pulm toxicity while on amio.  . Chronic systolic CHF (congestive heart failure)     a. 02/2013 Echo: EF 35% inflat AK, mod to sev calcified MV annulus w/o MS, mod dil LA, PASP .  Marland Kitchen Hypertension   . Anxiety   . Depression   .  Hyperlipidemia   . Mitral insufficiency     a. 05/2008 s/p MV repair with [employer] MV ring (model 5200, 26mm ser # [id number].  Marland Kitchen IDA (iron deficiency anemia)   . Subarachnoid hemorrhage     a. in setting of syncope/fall/digoxin toxicity 11/2007.  Marland Kitchen LBBB (left bundle branch block)   . Chronic anticoagulation     coumadin  . High risk medication use     Tikosyn  . Tachycardia-bradycardia     a. 05/2008 s/p MDT P1501DR Enrhythm DC PPM, ser # [id number].   Past Surgical History  Procedure Laterality Date  . Cardiac catheterization  03/19/2007    EF 45%. THERE IS 2+ MITRAL INSUFFICIENCY. THERE IS MODERATE INFERIOR WALL HYPOKINESIA WITH OVERALL MILD TO MODERATE LV DYSFUNCTION  . Coronary stent placement  1996, AND 03/2007    RIGHT CORONARY  . Removal of colon polyp      AND A LARGE CECAL POLYP  . Coronary artery bypass graft  04/2008    INCLUDING A MITRAL VALVE REPAIR AND A LEFT SIDED MAZE PROCEDURE  . Coronary artery bypass graft      X2 with LIMA to LAD and SVG to PD and WITH A LEFT SIDED MAZE PROCEDURE & MITRAL VALVE REPAIR. THIS INCLUDED AN LIMA GRAFT TO THE LAD, AND A SAPHENOUS VEIN GRAFT TO THE PDA.  Marland Kitchen Transthoracic  echocardiogram  06/21/2008    EF 35%  . Pacemaker insertion  05/19/2008    MDT EnRhythm implanted for mobitz II AV block by Dr Reyes Ivan  . Mitral valve repair     Family History  Problem Relation Age of Onset  . Heart attack Mother   . Cancer Father    History  Substance Use Topics  . Smoking status: Current Some Day Smoker    Last Attempt to Quit: 05/06/1996  . Smokeless tobacco: Never Used  . Alcohol Use: No   OB History   Grav Para Term Preterm Abortions TAB SAB Ect Mult Living                 Review of Systems  Unable to perform ROS: Acuity of condition  Respiratory: Positive for cough and shortness of breath.   Cardiovascular: Negative for syncope.  Gastrointestinal: Negative for vomiting and abdominal pain.      Allergies  Amiodarone and  Diphenhydramine hcl  Home Medications   Current Outpatient Rx  Name  Route  Sig  Dispense  Refill  . Cholecalciferol (VITAMIN D) 2000 UNITS tablet   Oral   Take 2,000 Units by mouth daily.           Marland Kitchen dofetilide (TIKOSYN) 250 MCG capsule   Oral   Take 1 capsule (250 mcg total) by mouth every 12 (twelve) hours.   60 capsule   3   . ezetimibe (ZETIA) 10 MG tablet   Oral   Take 10 mg by mouth daily.         . furosemide (LASIX) 20 MG tablet   Oral   Take 1 tablet (20 mg total) by mouth daily.   30 tablet   3   . metoprolol tartrate (LOPRESSOR) 25 MG tablet   Oral   Take 12.5 mg by mouth 2 (two) times daily.         Marland Kitchen amoxicillin-clavulanate (AUGMENTIN) 875-125 MG per tablet   Oral   Take 1 tablet by mouth 2 (two) times daily.   20 tablet   0   . ezetimibe (ZETIA) 10 MG tablet   Oral   Take 10 mg by mouth daily.         Marland Kitchen lisinopril (PRINIVIL,ZESTRIL) 2.5 MG tablet   Oral   Take 1 tablet (2.5 mg total) by mouth daily.   30 tablet   3   . pantoprazole (PROTONIX) 40 MG tablet   Oral   Take 40 mg by mouth daily.         . potassium chloride (K-DUR,KLOR-CON) 10 MEQ tablet   Oral   Take 10 mEq by mouth once.         . predniSONE (DELTASONE) 20 MG tablet   Oral   Take 2 tablets (40 mg total) by mouth daily.   12 tablet   0    BP 163/92  Pulse 118  Temp(Src) 99 F (37.2 C) (Oral)  Resp 29  SpO2 94% on 4L  O2 Physical Exam  Nursing note and vitals reviewed. Constitutional: She appears well-developed and well-nourished.  HENT:  Head: Normocephalic and atraumatic.  Eyes: EOM are normal.  Neck: Normal range of motion. Neck supple.  Cardiovascular: Regular rhythm.   Occasional extrasystoles are present. Tachycardia present.   Pulses:      Radial pulses are 1+ on the right side, and 1+ on the left side.  Pulmonary/Chest: She is in respiratory distress. She has no wheezes. She has no rales.  Abdominal: Soft.  Skin: Skin is warm.    ED  Course  Procedures (including critical care time) Labs Review Labs Reviewed  CBC WITH DIFFERENTIAL - Abnormal; Notable for the following:    WBC 13.3 (*)    RDW 19.2 (*)    Neutrophils Relative % 81 (*)    Neutro Abs 10.8 (*)    All other components within normal limits  COMPREHENSIVE METABOLIC PANEL - Abnormal; Notable for the following:    Glucose, Bld 148 (*)    GFR calc non Af Amer 79 (*)    All other components within normal limits  PRO B NATRIURETIC PEPTIDE - Abnormal; Notable for the following:    Pro B Natriuretic peptide (BNP) 2837.0 (*)    All other components within normal limits  URINALYSIS, ROUTINE W REFLEX MICROSCOPIC - Abnormal; Notable for the following:    APPearance CLOUDY (*)    Hgb urine dipstick TRACE (*)    Leukocytes, UA SMALL (*)    All other components within normal limits  URINE MICROSCOPIC-ADD ON - Abnormal; Notable for the following:    Squamous Epithelial / LPF FEW (*)    All other components within normal limits  POCT I-STAT 3, BLOOD GAS (G3P V) - Abnormal; Notable for the following:    pH, Ven 7.417 (*)    pCO2, Ven 35.8 (*)    pO2, Ven 88.0 (*)    All other components within normal limits  TROPONIN I  CG4 I-STAT (LACTIC ACID)   Imaging Review Dg Chest Port 1 View  11/23/2013   CLINICAL DATA:  Chest pain and shortness of breath.  EXAM: PORTABLE CHEST - 1 VIEW  COMPARISON:  10/16/2013  FINDINGS: Left-sided pacemaker and sternotomy wires and mitral valve ring are unchanged. Lungs are adequately inflated with increased perihilar bibasilar markings likely interstitial edema, although cannot exclude infection. Subtle blunting of the right costophrenic angle as cannot exclude a small amount of right pleural fluid. Cardiomediastinal silhouette and remainder of the exam is unchanged.  IMPRESSION: Increased perihilar and bibasilar markings suggesting interstitial edema and less likely infection. Possible small amount of right pleural fluid.   Electronically  Signed   By: Elberta Fortisaniel  Boyle M.D.   On: 11/23/2013 08:09    EKG Interpretation    Date/Time:  Monday November 23 2013 07:32:12 EST Ventricular Rate:  119 PR Interval:  96 QRS Duration: 146 QT Interval:  369 QTC Calculation: 519 R Axis:   53 Text Interpretation:  Sinus tachycardia question pacemaker spikes IVCD, consider atypical LBBB Baseline wander in lead(s) III , I Abnormal ekg Confirmed by [facility]  MD, MICHEAL ([id number]) on 11/23/2013 7:36:56 AM           RA sat is 94% on 4L of O2 is adequate  8:20 AM Pt coughed up sig amount of sputum after neb treatment and pt reports feeling much improved.  Lungs more clear, sats up to 100% on supplemental O2.  Still sinus tachycardia.  12:35 PM Pt has ambulated in hallway without oxygen, maintained O2 sats, HR down to about 100.  No abn's on medtronic analysis.  Pt feels well, has eaten.  Pt still with wet cough on occasion, but she no longer feels SOB, desires to go home.  Will put her on inhaler, oral abx and she knows to follow up closely with PCP, return if worse.       MDM   Final diagnoses:  Dyspnea  Bronchitis   Pt with low O2 sats on RA,  not on home O2 normally, anxious appearing, some resp distress, rhonchi and wheezing, rales.  Will give neb, some ativan due to anxiousness and h/o same.  Will interrogate pacemaker.  Check troponin, BNP, and get CXR to assess for pneumonia.     Gavin Pound. Karene Bracken, MD 11/23/13 1241

## 2014-01-02 ENCOUNTER — Other Ambulatory Visit (HOSPITAL_COMMUNITY): Payer: Self-pay | Admitting: Physician Assistant

## 2014-01-09 ENCOUNTER — Other Ambulatory Visit: Payer: Self-pay | Admitting: Internal Medicine

## 2014-01-15 ENCOUNTER — Encounter: Payer: Self-pay | Admitting: *Deleted

## 2014-01-28 ENCOUNTER — Other Ambulatory Visit: Payer: Self-pay

## 2014-01-28 ENCOUNTER — Other Ambulatory Visit (HOSPITAL_COMMUNITY): Payer: Self-pay | Admitting: Physician Assistant

## 2014-01-28 MED ORDER — FUROSEMIDE 20 MG PO TABS: ORAL_TABLET | ORAL | Status: DC

## 2014-01-28 MED ORDER — LISINOPRIL 2.5 MG PO TABS: ORAL_TABLET | ORAL | Status: DC

## 2014-02-17 ENCOUNTER — Other Ambulatory Visit: Payer: Self-pay | Admitting: Cardiology

## 2014-04-08 ENCOUNTER — Encounter (HOSPITAL_COMMUNITY): Payer: Self-pay | Admitting: Emergency Medicine

## 2014-04-08 ENCOUNTER — Emergency Department (HOSPITAL_COMMUNITY): Payer: Medicare Other

## 2014-04-08 ENCOUNTER — Observation Stay (HOSPITAL_COMMUNITY)
Admission: EM | Admit: 2014-04-08 | Discharge: 2014-04-10 | Disposition: A | Discharge status: Home / Self Care | Payer: Medicare Other | Attending: Cardiovascular Disease | Admitting: Cardiovascular Disease

## 2014-04-08 DIAGNOSIS — F3289 Other specified depressive episodes: Secondary | ICD-10-CM | POA: Diagnosis not present

## 2014-04-08 DIAGNOSIS — Z951 Presence of aortocoronary bypass graft: Secondary | ICD-10-CM | POA: Insufficient documentation

## 2014-04-08 DIAGNOSIS — I4891 Unspecified atrial fibrillation: Secondary | ICD-10-CM

## 2014-04-08 DIAGNOSIS — I10 Essential (primary) hypertension: Secondary | ICD-10-CM | POA: Diagnosis not present

## 2014-04-08 DIAGNOSIS — I2510 Atherosclerotic heart disease of native coronary artery without angina pectoris: Secondary | ICD-10-CM

## 2014-04-08 DIAGNOSIS — D72829 Elevated white blood cell count, unspecified: Secondary | ICD-10-CM | POA: Diagnosis not present

## 2014-04-08 DIAGNOSIS — F411 Generalized anxiety disorder: Secondary | ICD-10-CM | POA: Diagnosis not present

## 2014-04-08 DIAGNOSIS — F17200 Nicotine dependence, unspecified, uncomplicated: Secondary | ICD-10-CM | POA: Insufficient documentation

## 2014-04-08 DIAGNOSIS — E785 Hyperlipidemia, unspecified: Secondary | ICD-10-CM | POA: Diagnosis not present

## 2014-04-08 DIAGNOSIS — R0902 Hypoxemia: Secondary | ICD-10-CM

## 2014-04-08 DIAGNOSIS — I4819 Other persistent atrial fibrillation: Secondary | ICD-10-CM

## 2014-04-08 DIAGNOSIS — I509 Heart failure, unspecified: Secondary | ICD-10-CM | POA: Diagnosis present

## 2014-04-08 DIAGNOSIS — I059 Rheumatic mitral valve disease, unspecified: Secondary | ICD-10-CM | POA: Insufficient documentation

## 2014-04-08 DIAGNOSIS — D509 Iron deficiency anemia, unspecified: Secondary | ICD-10-CM | POA: Insufficient documentation

## 2014-04-08 DIAGNOSIS — I495 Sick sinus syndrome: Secondary | ICD-10-CM | POA: Insufficient documentation

## 2014-04-08 DIAGNOSIS — F329 Major depressive disorder, single episode, unspecified: Secondary | ICD-10-CM | POA: Diagnosis not present

## 2014-04-08 DIAGNOSIS — Z950 Presence of cardiac pacemaker: Secondary | ICD-10-CM | POA: Insufficient documentation

## 2014-04-08 DIAGNOSIS — I447 Left bundle-branch block, unspecified: Secondary | ICD-10-CM | POA: Diagnosis not present

## 2014-04-08 DIAGNOSIS — Z9861 Coronary angioplasty status: Secondary | ICD-10-CM | POA: Insufficient documentation

## 2014-04-08 DIAGNOSIS — I480 Paroxysmal atrial fibrillation: Secondary | ICD-10-CM

## 2014-04-08 DIAGNOSIS — I252 Old myocardial infarction: Secondary | ICD-10-CM | POA: Diagnosis not present

## 2014-04-08 DIAGNOSIS — I5023 Acute on chronic systolic (congestive) heart failure: Secondary | ICD-10-CM

## 2014-04-08 DIAGNOSIS — I5043 Acute on chronic combined systolic (congestive) and diastolic (congestive) heart failure: Principal | ICD-10-CM | POA: Insufficient documentation

## 2014-04-08 LAB — BASIC METABOLIC PANEL
Anion gap: 19 — ABNORMAL HIGH (ref 5–15)
BUN: 13 mg/dL (ref 6–23)
CHLORIDE: 101 meq/L (ref 96–112)
CO2: 20 mEq/L (ref 19–32)
Calcium: 9.5 mg/dL (ref 8.4–10.5)
Creatinine, Ser: 0.73 mg/dL (ref 0.50–1.10)
GFR calc Af Amer: >90 mL/min (ref >90)
GFR calc non Af Amer: 78 mL/min — ABNORMAL LOW (ref >90)
GLUCOSE: 144 mg/dL — AB (ref 70–99)
POTASSIUM: 4.7 meq/L (ref 3.7–5.3)
Sodium: 140 mEq/L (ref 137–147)

## 2014-04-08 LAB — PRO B NATRIURETIC PEPTIDE: Pro B Natriuretic peptide (BNP): 2409 pg/mL — ABNORMAL HIGH (ref 0–450)

## 2014-04-08 LAB — CBC
HCT: 43.7 % (ref 36.0–46.0)
HEMATOCRIT: 46.5 % — AB (ref 36.0–46.0)
HEMOGLOBIN: 14.9 g/dL (ref 12.0–15.0)
HEMOGLOBIN: 14.3 g/dL (ref 12.0–15.0)
MCH: 27.3 pg (ref 26.0–34.0)
MCH: 27.5 pg (ref 26.0–34.0)
MCHC: 32 g/dL (ref 30.0–36.0)
MCHC: 32.7 g/dL (ref 30.0–36.0)
MCV: 85.2 fL (ref 78.0–100.0)
MCV: 84 fL (ref 78.0–100.0)
Platelets: 257 10*3/uL (ref 150–400)
Platelets: 245 10*3/uL (ref 150–400)
RBC: 5.46 MIL/uL — ABNORMAL HIGH (ref 3.87–5.11)
RBC: 5.2 MIL/uL — ABNORMAL HIGH (ref 3.87–5.11)
RDW: 16.5 % — ABNORMAL HIGH (ref 11.5–15.5)
RDW: 16.4 % — ABNORMAL HIGH (ref 11.5–15.5)
WBC: 18.5 10*3/uL — ABNORMAL HIGH (ref 4.0–10.5)
WBC: 14.3 10*3/uL — ABNORMAL HIGH (ref 4.0–10.5)

## 2014-04-08 LAB — TSH: TSH: 0.784 u[IU]/mL (ref 0.350–4.500)

## 2014-04-08 LAB — MAGNESIUM: MAGNESIUM: 2.1 mg/dL (ref 1.5–2.5)

## 2014-04-08 LAB — I-STAT TROPONIN, ED: TROPONIN I, POC: 0 ng/mL (ref 0.00–0.08)

## 2014-04-08 LAB — CREATININE, SERUM
Creatinine, Ser: 0.78 mg/dL (ref 0.50–1.10)
GFR calc Af Amer: 88 mL/min — ABNORMAL LOW (ref >90)
GFR, EST NON AFRICAN AMERICAN: 76 mL/min — AB (ref >90)

## 2014-04-08 LAB — TROPONIN I
Troponin I: <0.3 ng/mL (ref <0.30)
Troponin I: <0.3 ng/mL (ref <0.30)

## 2014-04-08 MED ORDER — ONDANSETRON HCL 4 MG/2ML IJ SOLN: 4.0000 mg | Freq: Four times a day (QID) | INTRAMUSCULAR | Status: DC | PRN

## 2014-04-08 MED ORDER — VITAMIN D 50 MCG (2000 UT) PO TABS: 2000.0000 [IU] | ORAL_TABLET | Freq: Every day | ORAL | Status: DC

## 2014-04-08 MED ORDER — LISINOPRIL 2.5 MG PO TABS
2.5000 mg | ORAL_TABLET | Freq: Every day | ORAL | Status: DC
  Administered 2014-04-09: 2.5 mg via ORAL
  Filled 2014-04-08 (×2): qty 1

## 2014-04-08 MED ORDER — ENOXAPARIN SODIUM 40 MG/0.4ML ~~LOC~~ SOLN
40.0000 mg | SUBCUTANEOUS | Status: DC
  Administered 2014-04-08 – 2014-04-09 (×2): 40 mg via SUBCUTANEOUS
  Filled 2014-04-08 (×3): qty 0.4

## 2014-04-08 MED ORDER — SODIUM CHLORIDE 0.9 % IJ SOLN
3.0000 mL | Freq: Two times a day (BID) | INTRAMUSCULAR | Status: DC
  Administered 2014-04-08 – 2014-04-09 (×3): 3 mL via INTRAVENOUS

## 2014-04-08 MED ORDER — DOFETILIDE 250 MCG PO CAPS
250.0000 ug | ORAL_CAPSULE | Freq: Two times a day (BID) | ORAL | Status: DC
  Administered 2014-04-08 – 2014-04-09 (×3): 250 ug via ORAL
  Filled 2014-04-08 (×6): qty 1

## 2014-04-08 MED ORDER — EZETIMIBE 10 MG PO TABS
10.0000 mg | ORAL_TABLET | Freq: Every day | ORAL | Status: DC
  Administered 2014-04-08 – 2014-04-09 (×2): 10 mg via ORAL
  Filled 2014-04-08 (×3): qty 1

## 2014-04-08 MED ORDER — VITAMIN D3 25 MCG (1000 UNIT) PO TABS
2000.0000 [IU] | ORAL_TABLET | Freq: Every day | ORAL | Status: DC
  Administered 2014-04-08 – 2014-04-09 (×2): 2000 [IU] via ORAL
  Filled 2014-04-08 (×3): qty 2

## 2014-04-08 MED ORDER — ACETAMINOPHEN 325 MG PO TABS: 650.0000 mg | ORAL_TABLET | ORAL | Status: DC | PRN

## 2014-04-08 MED ORDER — SODIUM CHLORIDE 0.9 % IV SOLN: 250.0000 mL | INTRAVENOUS | Status: DC | PRN

## 2014-04-08 MED ORDER — FUROSEMIDE 10 MG/ML IJ SOLN
40.0000 mg | Freq: Once | INTRAMUSCULAR | Status: AC
  Administered 2014-04-08: 40 mg via INTRAVENOUS
  Filled 2014-04-08: qty 4

## 2014-04-08 MED ORDER — AZELASTINE HCL 0.1 % NA SOLN
1.0000 | Freq: Two times a day (BID) | NASAL | Status: DC
  Administered 2014-04-08 – 2014-04-09 (×3): 1 via NASAL
  Filled 2014-04-08: qty 30

## 2014-04-08 MED ORDER — METOPROLOL TARTRATE 12.5 MG HALF TABLET
12.5000 mg | ORAL_TABLET | Freq: Two times a day (BID) | ORAL | Status: DC
  Administered 2014-04-08 – 2014-04-09 (×2): 12.5 mg via ORAL
  Filled 2014-04-08 (×3): qty 1

## 2014-04-08 MED ORDER — PANTOPRAZOLE SODIUM 40 MG PO TBEC
40.0000 mg | DELAYED_RELEASE_TABLET | Freq: Every day | ORAL | Status: DC
  Administered 2014-04-09: 40 mg via ORAL
  Filled 2014-04-08: qty 1

## 2014-04-08 MED ORDER — POTASSIUM CHLORIDE CRYS ER 10 MEQ PO TBCR
10.0000 meq | EXTENDED_RELEASE_TABLET | Freq: Once | ORAL | Status: AC
  Administered 2014-04-08: 10 meq via ORAL
  Filled 2014-04-08: qty 1

## 2014-04-08 MED ORDER — SODIUM CHLORIDE 0.9 % IJ SOLN: 3.0000 mL | INTRAMUSCULAR | Status: DC | PRN

## 2014-04-08 NOTE — H&P (Signed)
Patient ID: [name] MRN: [id number], DOB/AGE: [birth year]   Admit date: 04/08/2014   Primary Physician: Maryelizabeth RowanEWEY,ELIZABETH, MD Primary Cardiologist: Dr. Allred/ Dr. [place]   Pt. Profile: [name] is an 80 y.o. female with a history of CAD (s/p multiple stents, CABG x2 w/ mitral valve repair and Maze in 2009), AFIB in sinus on Tikosyn, chronic systolic CHF with LV EF 35% (ECHO 02/2013), HTN, hyperlipidemia, MR, tachy-brady syndrome s/p PPM (2009) and depression who [facility] complaining of SOB x2 days. She arrives stating she "doesn't feel good, and just let me die"  She has a hx of CAD s/p 1996 s/p PS BMS; 03/2007 NSTEMI/PCI: 3.0x24 and 3.0x28 Taxus DES' t RCA; 05/2008 CABG x 2 with MV repair and Maze (LIMA->LAD, VG->PDA). She was admitted in 08/2013 for acute exacerbation of CHF in the setting of profound anemia. She was iron deficient and she was thought to have a GI bleed but she refused GI eval which was recommended. She was taken off coumadin at that time. Seen in clinic for post hospital follow up and continued to not be very interested in trying to make any aspect of her life better. Kept reiterating that she wants to die and is "ready to go". Seen in office in Feb. She weighed 153lbs at that time and felt to be euvolemic. Pacemaker checked at that time with normal function.  She admitted to eating potato chips in the past couple days and has been getting progressively more SOB. Today it got to the point that she called EMS. She does have some chest heaviness that has improved with diuresis. She states that this does not feel like her cardiac chest pain, although she cannot remember what that felt like. She denies any recent fevers, chills, n/v, coughs or colds. No dysuria. No blood in stool or urine.       Problem List  Past Medical History  Diagnosis Date  . Coronary artery disease     a. 1996 s/p PS BMS;  b. 03/2007 NSTEMI/PCI: 3.0x24 and 3.0x28 Taxus DES'  t RCA; c. 05/2008 CABG x 2 with MV repair and Maze (LIMA->LAD, VG->PDA)  . Atrial fibrillation     a. in sinus on Tikosyn and coumadin;  b. s/p MAZE @ time of MVR/CABG 05/2008;  c. h/o pulm toxicity while on amio.  . Chronic systolic CHF (congestive heart failure)     a. 02/2013 Echo: EF 35% inflat AK, mod to sev calcified MV annulus w/o MS, mod dil LA, PASP 33mmHg.  Marland Kitchen. Hypertension   . Anxiety   . Depression   . Hyperlipidemia   . Mitral insufficiency     a. 05/2008 s/p MV repair with [employer] MV ring (model 5200, 26mm ser # [id number].  Marland Kitchen. IDA (iron deficiency anemia)   . Subarachnoid hemorrhage     a. in setting of syncope/fall/digoxin toxicity 11/2007.  Marland Kitchen. LBBB (left bundle branch block)   . Chronic anticoagulation     coumadin  . High risk medication use     Tikosyn  . Tachycardia-bradycardia     a. 05/2008 s/p MDT P1501DR Enrhythm DC PPM, ser # [id number].    Past Surgical History  Procedure Laterality Date  . Cardiac catheterization  03/19/2007    EF 45%. THERE IS 2+ MITRAL INSUFFICIENCY. THERE IS MODERATE INFERIOR WALL HYPOKINESIA WITH OVERALL MILD TO MODERATE LV DYSFUNCTION  . Coronary stent placement  1996, AND 03/2007    RIGHT CORONARY  .  Removal of colon polyp      AND A LARGE CECAL POLYP  . Coronary artery bypass graft  04/2008    INCLUDING A MITRAL VALVE REPAIR AND A LEFT SIDED MAZE PROCEDURE  . Coronary artery bypass graft      X2 with LIMA to LAD and SVG to PD and WITH A LEFT SIDED MAZE PROCEDURE & MITRAL VALVE REPAIR. THIS INCLUDED AN LIMA GRAFT TO THE LAD, AND A SAPHENOUS VEIN GRAFT TO THE PDA.  Marland Kitchen Transthoracic echocardiogram  06/21/2008    EF 35%  . Pacemaker insertion  05/19/2008    MDT EnRhythm implanted for mobitz II AV block by Dr Reyes Ivan  . Mitral valve repair       Allergies  Allergies  Allergen Reactions  . Amiodarone     unknown  . Diphenhydramine Hcl Other (See Comments)    unknown     Home Medications  Prior to Admission medications     Medication Sig Start Date End Date Taking? Authorizing Provider  azelastine (ASTELIN) 0.1 % nasal spray Place 1 spray into both nostrils 2 (two) times daily. Use in each nostril as directed   Yes Historical Provider, MD  Cholecalciferol (VITAMIN D) 2000 UNITS tablet Take 2,000 Units by mouth daily.     Yes Historical Provider, MD  dofetilide (TIKOSYN) 250 MCG capsule Take 250 mcg by mouth 2 (two) times daily.   Yes Historical Provider, MD  ezetimibe (ZETIA) 10 MG tablet Take 10 mg by mouth daily.   Yes Historical Provider, MD  furosemide (LASIX) 20 MG tablet Take 20 mg by mouth daily.   Yes Historical Provider, MD  lisinopril (PRINIVIL,ZESTRIL) 2.5 MG tablet Take 2.5 mg by mouth daily.   Yes Historical Provider, MD  metoprolol tartrate (LOPRESSOR) 25 MG tablet Take 12.5 mg by mouth 2 (two) times daily.   Yes Historical Provider, MD  OVER THE COUNTER MEDICATION Place 1 drop into both eyes daily as needed (for itchy eyes). Eye Drops   Yes Historical Provider, MD  pantoprazole (PROTONIX) 40 MG tablet Take 40 mg by mouth daily.   Yes Historical Provider, MD  potassium chloride (K-DUR,KLOR-CON) 10 MEQ tablet Take 10 mEq by mouth once.   Yes Historical Provider, MD    Family History  Family History  Problem Relation Age of Onset  . Heart attack Mother   . Cancer Father    Family Status  Relation Status Death Age  . Mother Deceased 27  . Father Deceased 22     Social History  History   Social History  . Marital Status: Married    Spouse Name: N/A    Number of Children: N/A  . Years of Education: N/A   Occupational History  . Not on file.   Social History Main Topics  . Smoking status: Current Some Day Smoker    Last Attempt to Quit: 05/06/1996  . Smokeless tobacco: Never Used  . Alcohol Use: No  . Drug Use: No  . Sexual Activity: No   Other Topics Concern  . Not on file   Social History Narrative  . No narrative on file     Review of Systems General:  No chills,  fever, night sweats or weight changes.  Cardiovascular:  + chest heaviness, +dyspnea on exertion, edema, orthopnea, palpitations, paroxysmal nocturnal dyspnea. Dermatological: No rash, lesions/masses Respiratory: No cough, dyspnea Urologic: No hematuria, dysuria Abdominal:   No nausea, vomiting, diarrhea, bright red blood per rectum, melena, or hematemesis Neurologic:  No visual  changes, wkns, changes in mental status. All other systems reviewed and are otherwise negative except as noted above.  Physical Exam  Blood pressure 124/68, pulse 111, temperature 98 F (36.7 C), temperature source Oral, resp. rate 26, weight 164 lb 9.6 oz (74.662 kg), SpO2 96.00%.  General: Pleasant, NAD. Ornery Psych: Normal affect. Neuro: Alert and oriented X 3. Moves all extremities spontaneously. HEENT: Normal  Neck: Supple without bruits or +JVD. Lungs:  Resp regular and unlabored, Crackles at bases Heart: tachycardic. no s3, s4, or murmurs. Abdomen: Soft, non-tender, non-distended, BS + x 4.  Extremities: No clubbing, cyanosis or edema. DP/PT/Radials 2+ and equal bilaterally.Trace edema  Labs  No results found for this basename: CKTOTAL, CKMB, TROPONINI,  in the last 72 hours Lab Results  Component Value Date   WBC 18.5* 04/08/2014   HGB 14.9 04/08/2014   HCT 46.5* 04/08/2014   MCV 85.2 04/08/2014   PLT 257 04/08/2014    Recent Labs Lab 04/08/14 1123  NA 140  K 4.7  CL 101  CO2 20  BUN 13  CREATININE 0.73  CALCIUM 9.5  GLUCOSE 144*    Radiology/Studies  Dg Chest Port 1 View  04/08/2014   CLINICAL DATA:  Shortness of breath  EXAM: PORTABLE CHEST - 1 VIEW  COMPARISON:  November 23, 2013  FINDINGS: The heart size and mediastinal contours are stable. Patient is status post prior median sternotomy. Cardiac valvular replacement ring is identified. Dual lead cardiac pacemaker is unchanged. There is diffuse increased pulmonary interstitium bilaterally. There is no focal pneumonia or pleural effusion. The  visualized skeletal structures are stable.  IMPRESSION: Mild interstitial pulmonary edema.     ECG none  ASSESSMENT AND PLAN  [name] is an 80 y.o. female with a history of CAD (s/p multiple stents, CABG x2 w/ mitral valve repair and Maze in 2009), AFIB in sinus on Tikosyn, chronic systolic CHF with LV EF 35% (ECHO 02/2013), HTN, hyperlipidemia, MR, tachy-brady syndrome s/p PPM (2009) and depression who [facility] complaining of SOB x2 days. She arrives stating she "doesn't feel good, and just let me die"  Acute on chronic systolic CHF - BNP 2.4K, pulm edema on CXR.  -- 02/2013 Echo: EF 35% inflat AK, mod to sev calcified MV annulus w/o MS, mod dil LA, PASP 33mmHg. -- Weight increased since office visit in 11/2013 when seen in office 153lbs. Currently she is 164 lbs. -- Feeling much better after 1 dose 40mg  IV Lasix. -- Will admit for IV diuresis. Strict I/Os and daily weights    CAD - chest heaviness, but sounds more related to her -- Will cycle troponin.   PAF - holding sinus on Tikosyn.  -- Coumadin with discontinued in the setting of acute anemia in 08/2013. Her hg is excellent at 14.9 today. Consider resuming although she may refuse.   Depression - this has been long standing and verbal about being lonely. She did mention being interested in assisted livings.   Sick sinus syndrome  - Normal pacemaker function in 11/2013.  Leukocytosis- 18.5. CXR with no evidence of PNA. Will check a UA and culture.   SignedThereasa Parkin, STERN, KATHRYN, PA-C 04/08/2014, 2:17 PM  Pager [phone]    Attending Note:   The patient was seen and examined.  Agree with assessment and plan as noted above.  Changes made to the above note as needed.  [name] is chronically ill woman who presents to the ER with increasing dyspnea.  She has had  chronic systolic CHF for years.  She has not felt well for years   I suspect she has some acute on chronic systolic CHF.  She has been eating some  extra salt on occasion.   Her weight is up 5-10 lbs .  Her WBC is elevated - she denies any productive sputum, fever, dysuria, frequency.  Will admit her and diurese her for several days.  Would check another CX tomorrow ( pa and lateral )  Recheck WBC.  I do not think she needs a repeat echo at this point.   Vesta Mixer, Montez Hageman., MD, [facility] 04/08/2014, 3:10 PM

## 2014-04-08 NOTE — ED Notes (Signed)
Cardiologist at the bedside

## 2014-04-08 NOTE — ED Notes (Signed)
Per EMS, pt from home with c/o shortness of breath for the past 2 days. Pt has hx of pneumonia and CHF. Pt vitals stable with EMS, pt was 93% with room air on arrival, 96% with 2L O2. Pt stating she "doesn't feel good, and just let me die" on arrival. Pt A&O X4.

## 2014-04-08 NOTE — Progress Notes (Signed)
Patient would be ordered the oral care protocol under "at-risk" section, but patient has an allergy.

## 2014-04-08 NOTE — ED Notes (Signed)
Phlebotomy at the bedside  

## 2014-04-08 NOTE — ED Provider Notes (Signed)
CSN: [id number]     Arrival date & time 04/08/14  1053 History   First MD Initiated Contact with Patient 04/08/14 1058     Chief Complaint  Patient presents with  . Shortness of Breath     (Consider location/radiation/quality/duration/timing/severity/associated sxs/prior Treatment) HPI  [name] is a 80 y.o. female who is here for evaluation of shortness of breath. Dyspnea began 2 days ago, without clear cause. She's had this happen previously, when she had a pneumonia. Last pneumonia was 6 months ago. She is not receiving regular primary care services. She denies chest pain, focal weakness, nausea, vomiting, fever, chills, or cough. She is here with her son. She presents for evaluation by EMS. There are no other known modifying factors   Past Medical History  Diagnosis Date  . Coronary artery disease     a. 1996 s/p PS BMS;  b. 03/2007 NSTEMI/PCI: 3.0x24 and 3.0x28 Taxus DES' t RCA; c. 05/2008 CABG x 2 with MV repair and Maze (LIMA->LAD, VG->PDA)  . Atrial fibrillation     a. in sinus on Tikosyn and coumadin;  b. s/p MAZE @ time of MVR/CABG 05/2008;  c. h/o pulm toxicity while on amio.  . Chronic systolic CHF (congestive heart failure)     a. 02/2013 Echo: EF 35% inflat AK, mod to sev calcified MV annulus w/o MS, mod dil LA, PASP .  Marland Kitchen Hypertension   . Anxiety   . Depression   . Hyperlipidemia   . Mitral insufficiency     a. 05/2008 s/p MV repair with [employer] MV ring (model 5200, 26mm ser # [id number].  Marland Kitchen IDA (iron deficiency anemia)   . Subarachnoid hemorrhage     a. in setting of syncope/fall/digoxin toxicity 11/2007.  Marland Kitchen LBBB (left bundle branch block)   . Chronic anticoagulation     coumadin  . High risk medication use     Tikosyn  . Tachycardia-bradycardia     a. 05/2008 s/p MDT [id number] Enrhythm DC PPM, ser # [id number] H.   Past Surgical History  Procedure Laterality Date  . Cardiac catheterization  03/19/2007    EF 45%. THERE IS 2+ MITRAL INSUFFICIENCY.  THERE IS MODERATE INFERIOR WALL HYPOKINESIA WITH OVERALL MILD TO MODERATE LV DYSFUNCTION  . Coronary stent placement  1996, AND 03/2007    RIGHT CORONARY  . Removal of colon polyp      AND A LARGE CECAL POLYP  . Coronary artery bypass graft  04/2008    INCLUDING A MITRAL VALVE REPAIR AND A LEFT SIDED MAZE PROCEDURE  . Coronary artery bypass graft      X2 with LIMA to LAD and SVG to PD and WITH A LEFT SIDED MAZE PROCEDURE & MITRAL VALVE REPAIR. THIS INCLUDED AN LIMA GRAFT TO THE LAD, AND A SAPHENOUS VEIN GRAFT TO THE PDA.  Marland Kitchen Transthoracic echocardiogram  06/21/2008    EF 35%  . Pacemaker insertion  05/19/2008    MDT EnRhythm implanted for mobitz II AV block by Dr Reyes Ivan  . Mitral valve repair     Family History  Problem Relation Age of Onset  . Heart attack Mother   . Cancer Father    History  Substance Use Topics  . Smoking status: Current Some Day Smoker    Last Attempt to Quit: 05/06/1996  . Smokeless tobacco: Never Used  . Alcohol Use: No   OB History   Grav Para Term Preterm Abortions TAB SAB Ect Mult Living  Review of Systems  All other systems reviewed and are negative.     Allergies  Amiodarone and Diphenhydramine hcl  Home Medications   Prior to Admission medications   Medication Sig Start Date End Date Taking? Authorizing Provider  azelastine (ASTELIN) 0.1 % nasal spray Place 1 spray into both nostrils 2 (two) times daily. Use in each nostril as directed   Yes Historical Provider, MD  Cholecalciferol (VITAMIN D) 2000 UNITS tablet Take 2,000 Units by mouth daily.     Yes Historical Provider, MD  dofetilide (TIKOSYN) 250 MCG capsule Take 250 mcg by mouth 2 (two) times daily.   Yes Historical Provider, MD  ezetimibe (ZETIA) 10 MG tablet Take 10 mg by mouth daily.   Yes Historical Provider, MD  furosemide (LASIX) 20 MG tablet Take 20 mg by mouth daily.   Yes Historical Provider, MD  lisinopril (PRINIVIL,ZESTRIL) 2.5 MG tablet Take 2.5 mg by mouth  daily.   Yes Historical Provider, MD  metoprolol tartrate (LOPRESSOR) 25 MG tablet Take 12.5 mg by mouth 2 (two) times daily.   Yes Historical Provider, MD  OVER THE COUNTER MEDICATION Place 1 drop into both eyes daily as needed (for itchy eyes). Eye Drops   Yes Historical Provider, MD  pantoprazole (PROTONIX) 40 MG tablet Take 40 mg by mouth daily.   Yes Historical Provider, MD  potassium chloride (K-DUR,KLOR-CON) 10 MEQ tablet Take 10 mEq by mouth once.   Yes Historical Provider, MD   BP 128/77  Pulse 114  Temp(Src) 98 F (36.7 C) (Oral)  Resp 23  SpO2 94% Physical Exam  Nursing note and vitals reviewed. Constitutional: She is oriented to person, place, and time. She appears well-developed.  Elderly, frail  HENT:  Head: Normocephalic and atraumatic.  Right Ear: External ear normal.  Left Ear: External ear normal.  Eyes: Conjunctivae and EOM are normal. Pupils are equal, round, and reactive to light.  Neck: Normal range of motion and phonation normal. Neck supple.  Cardiovascular: Normal rate, regular rhythm, normal heart sounds and intact distal pulses.   Pulmonary/Chest: Effort normal. She exhibits no bony tenderness.  Decreased breath sounds, bilaterally  Abdominal: Soft. There is no tenderness.  Musculoskeletal: Normal range of motion.  1+ pitting edema, bilateral lower legs  Neurological: She is alert and oriented to person, place, and time. No cranial nerve deficit or sensory deficit. She exhibits normal muscle tone. Coordination normal.  She is a somewhat poor historian  Skin: Skin is warm, dry and intact.  Psychiatric: She has a normal mood and affect. Her behavior is normal.    ED Course  Procedures (including critical care time)  Medications  furosemide (LASIX) injection 40 mg (40 mg Intravenous Given 04/08/14 1152)    Patient Vitals for the past 24 hrs:  BP Temp Temp src Pulse Resp SpO2  04/08/14 1230 126/59 mmHg - - 114 35 94 %  04/08/14 1229 128/77 mmHg - - 114  - 94 %  04/08/14 1200 128/77 mmHg - - 107 23 95 %  04/08/14 1145 134/76 mmHg - - 109 30 94 %  04/08/14 1115 149/94 mmHg - - 122 28 90 %  04/08/14 1110 - - - - - 94 %  04/08/14 1105 150/86 mmHg 98 F (36.7 C) Oral 111 26 88 %  04/08/14 1059 - - - - - 96 %    Weight today is 5.4 kg, greater than on 11/09/13.  12:52 PM Reevaluation with update and discussion. After initial assessment and treatment, an  updated evaluation reveals she states that she feels better, and he has voided 3 times. Namine Beahm L    13:40- case discussed with cardiology, they will see and likely admit  Labs Review Labs Reviewed  CBC - Abnormal; Notable for the following:    WBC 18.5 (*)    RBC 5.46 (*)    HCT 46.5 (*)    RDW 16.5 (*)    All other components within normal limits  BASIC METABOLIC PANEL - Abnormal; Notable for the following:    Glucose, Bld 144 (*)    GFR calc non Af Amer 78 (*)    Anion gap 19 (*)    All other components within normal limits  PRO B NATRIURETIC PEPTIDE - Abnormal; Notable for the following:    Pro B Natriuretic peptide (BNP) 2409.0 (*)    All other components within normal limits  Rosezena SensorI-STAT TROPOININ, ED    Imaging Review Dg Chest Port 1 View  04/08/2014   CLINICAL DATA:  Shortness of breath  EXAM: PORTABLE CHEST - 1 VIEW  COMPARISON:  November 23, 2013  FINDINGS: The heart size and mediastinal contours are stable. Patient is status post prior median sternotomy. Cardiac valvular replacement ring is identified. Dual lead cardiac pacemaker is unchanged. There is diffuse increased pulmonary interstitium bilaterally. There is no focal pneumonia or pleural effusion. The visualized skeletal structures are stable.  IMPRESSION: Mild interstitial pulmonary edema.   Electronically Signed   By: Sherian ReinWei-Chen  Lin M.D.   On: 04/08/2014 11:41   CRITICAL CARE Performed by: Mancel BaleWENTZ,Jolane Bankhead L Total critical care time: 30 min. Critical care time was exclusive of separately billable procedures and  treating other patients. Critical care was necessary to treat or prevent imminent or life-threatening deterioration. Critical care was time spent personally by me on the following activities: development of treatment plan with patient and/or surrogate as well as nursing, discussions with consultants, evaluation of patient's response to treatment, examination of patient, obtaining history from patient or surrogate, ordering and performing treatments and interventions, ordering and review of laboratory studies, ordering and review of radiographic studies, pulse oximetry and re-evaluation of patient's condition.   Date: 04/08/14  Rate: 113  Rhythm: sinus tachycardia  QRS Axis: left  PR and QT Intervals: QT prolonged  ST/T Wave abnormalities: nonspecific ST/T changes  PR and QRS Conduction Disutrbances:nonspecific intraventricular conduction delay  Narrative Interpretation:   Old EKG Reviewed: unchanged- 11/23/12    EKG Interpretation None      MDM   Final diagnoses:  Acute on chronic systolic congestive heart failure  Hypoxia    Shortness of breath, with mild pulmonary edema, and no evidence for pneumonia on x-ray. Nonspecific elevation of white blood cell count. She is afebrile. She has a history of systolic heart failure with a EF of 35%. Weight is increased today. She'll need to be admitted for observation and treatment by cardiology service.    Nursing Notes Reviewed/ Care Coordinated, and agree without changes. Applicable Imaging Reviewed.  Interpretation of Laboratory Data incorporated into ED treatment  Plan: Admit    Flint MelterElliott L Fionn Stracke, MD 04/08/14 1354

## 2014-04-09 ENCOUNTER — Observation Stay (HOSPITAL_COMMUNITY): Payer: Medicare Other

## 2014-04-09 DIAGNOSIS — I5043 Acute on chronic combined systolic (congestive) and diastolic (congestive) heart failure: Secondary | ICD-10-CM | POA: Diagnosis not present

## 2014-04-09 DIAGNOSIS — I4891 Unspecified atrial fibrillation: Secondary | ICD-10-CM

## 2014-04-09 DIAGNOSIS — R0902 Hypoxemia: Secondary | ICD-10-CM

## 2014-04-09 DIAGNOSIS — D72829 Elevated white blood cell count, unspecified: Secondary | ICD-10-CM

## 2014-04-09 DIAGNOSIS — I10 Essential (primary) hypertension: Secondary | ICD-10-CM

## 2014-04-09 LAB — BASIC METABOLIC PANEL
Anion gap: 17 — ABNORMAL HIGH (ref 5–15)
BUN: 15 mg/dL (ref 6–23)
CO2: 24 mEq/L (ref 19–32)
Calcium: 9.3 mg/dL (ref 8.4–10.5)
Chloride: 96 mEq/L (ref 96–112)
Creatinine, Ser: 0.85 mg/dL (ref 0.50–1.10)
GFR calc Af Amer: 72 mL/min — ABNORMAL LOW (ref >90)
GFR calc non Af Amer: 63 mL/min — ABNORMAL LOW (ref >90)
Glucose, Bld: 85 mg/dL (ref 70–99)
Potassium: 4.1 mEq/L (ref 3.7–5.3)
Sodium: 137 mEq/L (ref 137–147)

## 2014-04-09 LAB — TROPONIN I: Troponin I: <0.3 ng/mL (ref <0.30)

## 2014-04-09 MED ORDER — METOPROLOL TARTRATE 25 MG PO TABS
25.0000 mg | ORAL_TABLET | Freq: Two times a day (BID) | ORAL | Status: DC
  Administered 2014-04-09: 25 mg via ORAL
  Filled 2014-04-09 (×3): qty 1

## 2014-04-09 MED ORDER — LISINOPRIL 5 MG PO TABS
5.0000 mg | ORAL_TABLET | Freq: Every day | ORAL | Status: DC
  Filled 2014-04-09: qty 1

## 2014-04-09 NOTE — Progress Notes (Signed)
SUBJECTIVE: The patient is doing better today.  Though she was miserable and just "wanted to die" yesterday, today she says that feels great and wants to go home "right now".   At this time, she denies chest pain.  She feels that her shortness of breath is better.  She is without new concerns.  Marland Kitchen. azelastine  1 spray Each Nare BID  . cholecalciferol  2,000 Units Oral Daily  . dofetilide  250 mcg Oral BID  . enoxaparin (LOVENOX) injection  40 mg Subcutaneous Q24H  . ezetimibe  10 mg Oral Daily  . lisinopril  2.5 mg Oral Daily  . metoprolol tartrate  12.5 mg Oral BID  . pantoprazole  40 mg Oral Daily  . sodium chloride  3 mL Intravenous Q12H      OBJECTIVE: Physical Exam: Filed Vitals:   04/08/14 2152 04/09/14 0150 04/09/14 0618 04/09/14 0922  BP: 87/51 92/74 90/41  108/54  Pulse: 95 97 71   Temp: 98.9 F (37.2 C) 98.8 F (37.1 C) 98 F (36.7 C)   TempSrc: Oral Oral Oral   Resp: 24 22 20    Height:      Weight:   159 lb 2.3 oz (72.188 kg)   SpO2: 97% 97% 97%     Intake/Output Summary (Last 24 hours) at 04/09/14 1157 Last data filed at 04/09/14 1025  Gross per 24 hour  Intake    720 ml  Output   1775 ml  Net  -1055 ml    Telemetry reveals sinus rhythm  GEN- The patient is chronically ill appearing, alert and oriented x 3 today,  cynical (this is her baseline cognitive state) Head- normocephalic, atraumatic Eyes-  Sclera clear, conjunctiva pink Ears- hearing intact Oropharynx- clear Neck- supple  Lungs- few basilar rales, normal work of breathing Heart- Regular rate and rhythm, 3/6 SEM LSB GI- soft, NT, ND, + BS Extremities- no clubbing, cyanosis, or edema Skin- no rash or lesion Psych- euthymic mood, full affect Neuro- strength and sensation are intact Diffuse muscle atrophy  LABS: Basic Metabolic Panel:  Recent Labs  60/73/7105/11/22 1123 04/08/14 1645 04/09/14 0402  NA 140  --  137  K 4.7  --  4.1  CL 101  --  96  CO2 20  --  24  GLUCOSE 144*  --  85  BUN  13  --  15  CREATININE 0.73 0.78 0.85  CALCIUM 9.5  --  9.3  MG  --  2.1  --    Liver Function Tests: No results found for this basename: AST, ALT, ALKPHOS, BILITOT, PROT, ALBUMIN,  in the last 72 hours No results found for this basename: LIPASE, AMYLASE,  in the last 72 hours CBC:  Recent Labs  04/08/14 1123 04/08/14 1645  WBC 18.5* 14.3*  HGB 14.9 14.3  HCT 46.5* 43.7  MCV 85.2 84.0  PLT 257 245   Cardiac Enzymes:  Recent Labs  04/08/14 1645 04/08/14 2147 04/09/14 0402  TROPONINI <0.30 <0.30 <0.30   Thyroid Function Tests:  Recent Labs  04/08/14 1645  TSH 0.784   Anemia Panel: No results found for this basename: VITAMINB12, FOLATE, FERRITIN, TIBC, IRON, RETICCTPCT,  in the last 72 hours  RADIOLOGY: Dg Chest 2 View  04/09/2014   CLINICAL DATA:  SOB  EXAM: CHEST  2 VIEW  COMPARISON:  Portable chest radiograph 04/08/2014  FINDINGS: Cardiac silhouette within the upper limits of normal. Patient is status post median sternotomy, coronary artery bypass grafting, and mitral valve replacement.  A left chest wall dual-chamber cardiac pacing unit stable. Mild increased density posterior left lung base and blunting of the posterior costophrenic angle. There is hyperinflation and flattening of the diaphragms. The bones are osteopenic. No acute osseous abnormalities.  IMPRESSION: Mild atelectasis versus infiltrate left lung base. Small posterior effusion.  COPD   Electronically Signed   By: Salome HolmesHector  Cooper M.D.   On: 04/09/2014 08:20   Dg Chest Port 1 View  04/08/2014   CLINICAL DATA:  Shortness of breath  EXAM: PORTABLE CHEST - 1 VIEW  COMPARISON:  November 23, 2013  FINDINGS: The heart size and mediastinal contours are stable. Patient is status post prior median sternotomy. Cardiac valvular replacement ring is identified. Dual lead cardiac pacemaker is unchanged. There is diffuse increased pulmonary interstitium bilaterally. There is no focal pneumonia or pleural effusion. The  visualized skeletal structures are stable.  IMPRESSION: Mild interstitial pulmonary edema.   Electronically Signed   By: Sherian ReinWei-Chen  Lin M.D.   On: 04/08/2014 11:41    ASSESSMENT AND PLAN:  Active Problems:   CHF (congestive heart failure)  1. Acute on chronic combined systolic/ diastolic CHF Improved with diuresis Continue medical therapy long term I will increase lisinopril and beta blocker today Obtain an ekg (none does this admit) 2 gram sodium restriction and daily weights are advised  2. Leukocytosis Appears to be a chronic issue I do not think that she has pneumonia clinically and will therefore hold off on antibiotics  3. afib Maintaining sinus at this time with tikosyn Earlier this year, her coumadin was stopped due to anemia Would consider restarting anticoagulation electively in the office  4. htn Stable No change required today  5. CAD No ischemic symptoms No further workup is planned  Will assess O2 requirement with PT Probably ready to discharge tomorrow Will need transition of care visit with Lawson FiscalLori who knows her well.  She is clear that she would prefer more conservative measures.  She has had several recent hospitalizations.  We should consider resources to prevent rehospitalization such as palliative care of [facility].  Her son is with her today.  He has a realistic understanding of her health and overall expected course.    Hillis RangeJames Ahmeer Tuman, MD 04/09/2014 11:57 AM

## 2014-04-09 NOTE — Progress Notes (Signed)
UR Completed.  [name] [name] [phone] 04/09/2014  

## 2014-04-10 ENCOUNTER — Encounter (HOSPITAL_COMMUNITY): Payer: Self-pay | Admitting: Physician Assistant

## 2014-04-10 DIAGNOSIS — I509 Heart failure, unspecified: Secondary | ICD-10-CM

## 2014-04-10 DIAGNOSIS — I5043 Acute on chronic combined systolic (congestive) and diastolic (congestive) heart failure: Secondary | ICD-10-CM | POA: Diagnosis not present

## 2014-04-10 DIAGNOSIS — I5023 Acute on chronic systolic (congestive) heart failure: Secondary | ICD-10-CM

## 2014-04-10 DIAGNOSIS — I2510 Atherosclerotic heart disease of native coronary artery without angina pectoris: Secondary | ICD-10-CM

## 2014-04-10 LAB — BASIC METABOLIC PANEL
ANION GAP: 13 (ref 5–15)
BUN: 17 mg/dL (ref 6–23)
CHLORIDE: 101 meq/L (ref 96–112)
CO2: 25 mEq/L (ref 19–32)
Calcium: 9 mg/dL (ref 8.4–10.5)
Creatinine, Ser: 0.85 mg/dL (ref 0.50–1.10)
GFR calc non Af Amer: 63 mL/min — ABNORMAL LOW (ref >90)
GFR, EST AFRICAN AMERICAN: 72 mL/min — AB (ref >90)
Glucose, Bld: 97 mg/dL (ref 70–99)
POTASSIUM: 4 meq/L (ref 3.7–5.3)
Sodium: 139 mEq/L (ref 137–147)

## 2014-04-10 LAB — CBC
HCT: 38.9 % (ref 36.0–46.0)
HEMOGLOBIN: 12.2 g/dL (ref 12.0–15.0)
MCH: 26.7 pg (ref 26.0–34.0)
MCHC: 31.4 g/dL (ref 30.0–36.0)
MCV: 85.1 fL (ref 78.0–100.0)
Platelets: 217 10*3/uL (ref 150–400)
RBC: 4.57 MIL/uL (ref 3.87–5.11)
RDW: 16.4 % — ABNORMAL HIGH (ref 11.5–15.5)
WBC: 6.8 10*3/uL (ref 4.0–10.5)

## 2014-04-10 MED ORDER — METOPROLOL TARTRATE 12.5 MG HALF TABLET
12.5000 mg | ORAL_TABLET | Freq: Two times a day (BID) | ORAL | Status: DC
  Filled 2014-04-10 (×2): qty 1

## 2014-04-10 MED ORDER — LISINOPRIL 5 MG PO TABS: 5.0000 mg | ORAL_TABLET | Freq: Every day | ORAL | Status: DC

## 2014-04-10 NOTE — Discharge Summary (Signed)
Personally seen and examined. Agree with above. Hypotension limits uptitration of medication. Please see progress note for details if needed.  Donato SchultzSKAINS, MARK, MD

## 2014-04-10 NOTE — Progress Notes (Signed)
Subjective:  A bit somber this AM. No SOB, no CP.   Objective:  Vital Signs in the last 24 hours: Temp:  [97.5 F (36.4 C)-97.9 F (36.6 C)] 97.6 F (36.4 C) (07/04 0644) Pulse Rate:  [64-87] 64 (07/04 0644) Resp:  [18-20] 18 (07/04 0644) BP: (91-108)/(40-62) 106/48 mmHg (07/04 0644) SpO2:  [96 %-99 %] 99 % (07/04 0644) Weight:  [162 lb 14.4 oz (73.891 kg)] 162 lb 14.4 oz (73.891 kg) (07/04 0644)  Intake/Output from previous day: 07/03 0701 - 07/04 0700 In: 960 [P.O.:960] Out: 850 [Urine:850]   Physical Exam: General: Well developed, well nourished, in no acute distress. Head:  Normocephalic and atraumatic. Lungs: Clear to auscultation and percussion. Heart: Normal S1 and S2.  2/6 S murmur rusb, no rubs or gallops.  Abdomen: soft, non-tender, positive bowel sounds. Extremities: No clubbing or cyanosis. No edema. Neurologic: Alert and oriented x 3.    Lab Results:  Recent Labs  04/08/14 1645 04/10/14 0415  WBC 14.3* 6.8  HGB 14.3 12.2  PLT 245 217    Recent Labs  04/09/14 0402 04/10/14 0415  NA 137 139  K 4.1 4.0  CL 96 101  CO2 24 25  GLUCOSE 85 97  BUN 15 17  CREATININE 0.85 0.85    Recent Labs  04/08/14 2147 04/09/14 0402  TROPONINI <0.30 <0.30    Imaging: Dg Chest 2 View  04/09/2014   CLINICAL DATA:  SOB  EXAM: CHEST  2 VIEW  COMPARISON:  Portable chest radiograph 04/08/2014  FINDINGS: Cardiac silhouette within the upper limits of normal. Patient is status post median sternotomy, coronary artery bypass grafting, and mitral valve replacement. A left chest wall dual-chamber cardiac pacing unit stable. Mild increased density posterior left lung base and blunting of the posterior costophrenic angle. There is hyperinflation and flattening of the diaphragms. The bones are osteopenic. No acute osseous abnormalities.  IMPRESSION: Mild atelectasis versus infiltrate left lung base. Small posterior effusion.  COPD   Electronically Signed   By: Salome HolmesHector   Cooper M.D.   On: 04/09/2014 08:20   Dg Chest Port 1 View  04/08/2014   CLINICAL DATA:  Shortness of breath  EXAM: PORTABLE CHEST - 1 VIEW  COMPARISON:  November 23, 2013  FINDINGS: The heart size and mediastinal contours are stable. Patient is status post prior median sternotomy. Cardiac valvular replacement ring is identified. Dual lead cardiac pacemaker is unchanged. There is diffuse increased pulmonary interstitium bilaterally. There is no focal pneumonia or pleural effusion. The visualized skeletal structures are stable.  IMPRESSION: Mild interstitial pulmonary edema.   Electronically Signed   By: Sherian ReinWei-Chen  Lin M.D.   On: 04/08/2014 11:41     Telemetry: SR Personally viewed.   EKG:  SR, wide complex QRS (possible paced rhythm)  Cardiac Studies:  EF 35% - 02/22/13  Assessment/Plan:  Active Problems:   CHF (congestive heart failure)  1. Acute on chronic combined systolic/ diastolic CHF  Improved with diuresis  Continue medical therapy long term  Increased lisinopril and beta blocker 7/3. Tolerating.  2 gram sodium restriction and daily weights are advised   2. Leukocytosis  Appears to be a chronic issue but improved currently   3. afib  Maintaining sinus at this time with tikosyn  Earlier this year, her coumadin was stopped due to anemia  Would consider restarting anticoagulation electively in the office   4. htn  Stable  No change required today   5. CAD  No ischemic symptoms  No further workup is planned  Ready to discharge    Will need transition of care visit with Lawson FiscalLori who knows her well.   She is clear that she would prefer more conservative measures. She has had several recent hospitalizations. We should consider resources to prevent rehospitalization such as palliative care of [facility]. Yesterday, her son was with her and has a realistic understanding of her health and overall expected course.    SKAINS, MARK 04/10/2014, 8:19 AM

## 2014-04-10 NOTE — Care Management Note (Signed)
    Page 1 of 1   04/10/2014     10:11:16 AM CARE MANAGEMENT NOTE 04/10/2014  Patient:  [name],[name]   Account Number:  [id number]  Date Initiated:  04/10/2014  Documentation initiated by:  [facility] LLCJEFFRIES,Dionne Rossa  Subjective/Objective Assessment:   adm: "doesn't feel good, and just let me die"     Action/Plan:   discharge planning   Anticipated DC Date:  04/10/2014   Anticipated DC Plan:  HOME/SELF CARE      DC Planning Services  CM consult      Choice offered to / List presented to:             Status of service:  Completed, signed off Medicare Important Message given?  YES (If response is "NO", the following Medicare IM given date fields will be blank) Date Medicare IM given:  04/08/2014 Medicare IM given by:   Date Additional Medicare IM given:   Additional Medicare IM given by:    Discharge Disposition:  HOME/SELF CARE  Per UR Regulation:    If discussed at Long Length of Stay Meetings, dates discussed:    Comments:  04/10/14 10:00 CM spoke with pt in room to discuss home needs.  Pt states she doesn't want any.  Pt states her son helps her out.  CM gave pt a list of private duty agencies in case she changes her mind when she gets home or she may use in the future.  No other CM needs were communicated. Freddy JakschSarah Gregg Winchell, BSN, CM [phone].

## 2014-04-10 NOTE — Progress Notes (Signed)
PT Cancellation Note  Patient Details Name: Kim CroftBarbara [name] MRN: [id number] DOB: 10-30-1929   Cancelled Treatment:    Reason Eval Not Completed: Order discontinued by PA. PA reports she is being discharged and no PT needs.    [name] 04/10/2014, 9:55 AM Pager [phone]

## 2014-04-10 NOTE — Significant Event (Signed)
SATURATION QUALIFICATIONS: (This note is used to comply with regulatory documentation for home oxygen)  Patient Saturations on Room Air at Rest = 97%  Patient Saturations on Room Air while Ambulating = 90%  Patient Saturations on 0 Liters of oxygen while Ambulating = 90%  Please briefly explain why patient needs home oxygen:n/a

## 2014-04-10 NOTE — Discharge Summary (Signed)
Discharge Summary   Patient ID: [name] MRN: [id number], DOB/AGE: Jan 04, 1930 80 y.o. Admit date: 04/08/2014 D/C date:     04/10/2014  Primary Care Provider: Maryelizabeth Rowan, MD Primary Cardiologist: Jordan/Allred (EP)  Primary Discharge Diagnoses:  1. Acute on chronic combined systolic and diastolic CHF 2. Leukocytosis, resolved, no evidence of infection 3. Paroxysmal atrial fibrillation, chronically on Tikosyn, in NSR - s/p MAZE @ time of MVR/CABG 05/2008; h/o pulm toxicity while on amio 4. HTN 5. CAD s/p BMS 1996; NSTEMI/PCI: 3.0x24 and 3.0x28 Taxus DES to mid & distal RCA 03/2007; CABG x 2 with MV repair and Maze (LIMA->LAD, VG->PDA) 05/2008   - chest pressure felt due to volume excess  Secondary Discharge Diagnoses:  1. Hyperlipidemia 2. Tachy-brady syndrome s/p Medtronic pacemaker 2009 3. Anxiety/Depression 4. Mitral insufficiency s/p MV repair with [employer] Lifesceiences MV ring (model 5200, 26mm ser # [id number] 05/2008  5. Iron deficiency anemia 6. Subarachnoid hemorrhage in setting of syncope/fall/digoxin toxicity 11/2007 7. LBBB  Hospital Course: [name] is an 80 y.o. female with a history of CAD (s/p multiple stents as above, CABG x2 w/ mitral valve repair and Maze in 2009), afib in sinus on Tikosyn, chronic systolic CHF with LV EF 35% (ECHO 02/2013), HTN, hyperlipidemia, tachy-brady syndrome s/p Medtronic PPM (2009) and depression who presented to [facility] 04/08/2014 complaining of SOB x 2 days. She is no longer on Coumadin due to profound anemia in 08/2013 thought due to a GI bleed but she had refused GI eval at that time. She was recently seen in clinic in February and continued to not be very interested in trying to make any aspect of her life better. She apparently kept reiterating that she wanted to die and was "ready to go." Last outpatient weight was 153lbs with normal pacemaker function. She presented to [facility] this admission complaining of progressive SOB and had been eating  potato chips in the past couple of days. She noted some chest heaviness that had improved with diuresis, not particularly like her prior cardiac pain. She denied any recent fevers, chills, n/v, coughs, dysuria, bleeding, or colds. She was afebrile. pBNP was elevated to 2409. Weight was up to 164lbs. She was admitted for IV diuresis for a/c combined CHF. Chest heaviness was felt related to her fluid overload and was managed conservatively. Her ACEI was titrated from 2.5mg  to 5mg . Her BB was initially titrated as well but BP was on softer side so this was kept at 12.5mg  BID on day of DC. HR remained controlled in NSR during admission. Troponins remained negative x 4. Of note, she also a leukocytosis during admission without evidence for active infection. This resolved by discharge - possibly stress demargination. Given that she was no longer anemic, Dr. Johney Frame recommended to consider restarting anticoagulation electively in the office. However, the patient was very clear that she would prefer conservative measures. We have consulted the weekend care management team to explore home health measures versus information on assisted living. Due to several prior admissions, she may benefit from these services to prevent readmission. It is not clear that she wants to have anyone come to her house but at least the option has been presented to her. We could consider palliative care consultation as outpatient if she continues bounce back given her state of mind. Dr. Anne Fu has seen and examined the patient today and feels she is stable for discharge. I have left a message on our office's scheduling voicemail requesting a follow-up transition-of-care appointment, and our office  will call the patient with this appointment. She was ambulated on day of discharge with O2 sat 93-96 on RA and did not require any supplemental O2 at DC. She did well with this. DC weight is recorded at 162. She will go home on prior home dose of Lasix per  d/w MD.  I also asked her to please confirm her home potassium dose as she was on KCl 10meq BID in December and her home med rec this admission said "10meq once" here (also reported as such in February). Per discussion with her we believe she is taking this once daily. Given her potassium while inpatient (4.7 on admission, then 4.0 after IV diuresis without significant supplementation), we will continue KCl 10meq daily. [facility].  Discharge Vitals: Blood pressure 106/48, pulse 64, temperature 97.6 F (36.4 C), temperature source Oral, resp. rate 18, height 5\' 4"  (1.626 m), weight 162 lb 14.4 oz (73.891 kg), SpO2 99.00%.  Labs: Lab Results  Component Value Date   WBC 6.8 04/10/2014   HGB 12.2 04/10/2014   HCT 38.9 04/10/2014   MCV 85.1 04/10/2014   PLT 217 04/10/2014    Recent Labs Lab 04/10/14 0415  NA 139  K 4.0  CL 101  CO2 25  BUN 17  CREATININE 0.85  CALCIUM 9.0  GLUCOSE 97    Recent Labs  04/08/14 1645 04/08/14 2147 04/09/14 0402  TROPONINI <0.30 <0.30 <0.30   Lab Results  Component Value Date   CHOL 204* 05/07/2012   HDL 40.50 05/07/2012   TRIG 196.0* 05/07/2012    Diagnostic Studies/Procedures   Dg Chest 2 View  04/09/2014   CLINICAL DATA:  SOB  EXAM: CHEST  2 VIEW  COMPARISON:  Portable chest radiograph 04/08/2014  FINDINGS: Cardiac silhouette within the upper limits of normal. Patient is status post median sternotomy, coronary artery bypass grafting, and mitral valve replacement. A left chest wall dual-chamber cardiac pacing unit stable. Mild increased density posterior left lung base and blunting of the posterior costophrenic angle. There is hyperinflation and flattening of the diaphragms. The bones are osteopenic. No acute osseous abnormalities.  IMPRESSION: Mild atelectasis versus infiltrate left lung base. Small posterior effusion.  COPD   Electronically Signed   By: Salome HolmesHector  Cooper M.D.   On: 04/09/2014 08:20   Dg Chest Port 1  View  04/08/2014   CLINICAL DATA:  Shortness of breath  EXAM: PORTABLE CHEST - 1 VIEW  COMPARISON:  November 23, 2013  FINDINGS: The heart size and mediastinal contours are stable. Patient is status post prior median sternotomy. Cardiac valvular replacement ring is identified. Dual lead cardiac pacemaker is unchanged. There is diffuse increased pulmonary interstitium bilaterally. There is no focal pneumonia or pleural effusion. The visualized skeletal structures are stable.  IMPRESSION: Mild interstitial pulmonary edema.   Electronically Signed   By: Sherian ReinWei-Chen  Lin M.D.   On: 04/08/2014 11:41    Discharge Medications   Current Discharge Medication List    CONTINUE these medications which have CHANGED   Details  lisinopril (PRINIVIL,ZESTRIL) 5 MG tablet Take 1 tablet (5 mg total) by mouth daily. Qty: 30 tablet, Refills: 6      CONTINUE these medications which have NOT CHANGED   Details  azelastine (ASTELIN) 0.1 % nasal spray Place 1 spray into both nostrils 2 (two) times daily. Use in each nostril as directed    Cholecalciferol (VITAMIN D) 2000 UNITS tablet Take 2,000 Units by mouth daily.  dofetilide (TIKOSYN) 250 MCG capsule Take 250 mcg by mouth 2 (two) times daily.    ezetimibe (ZETIA) 10 MG tablet Take 10 mg by mouth daily.    furosemide (LASIX) 20 MG tablet Take 20 mg by mouth daily.    metoprolol tartrate (LOPRESSOR) 25 MG tablet Take 12.5 mg by mouth 2 (two) times daily.    OVER THE COUNTER MEDICATION Place 1 drop into both eyes daily as needed (for itchy eyes). Eye Drops    pantoprazole (PROTONIX) 40 MG tablet Take 40 mg by mouth daily.    potassium chloride (K-DUR,KLOR-CON) 10 MEQ tablet Take 10 mEq by mouth daily.         Disposition   The patient will be discharged in stable condition to home. Discharge Instructions   Diet - low sodium heart healthy    Complete by:  As directed      Increase activity slowly    Complete by:  As directed           Follow-up  Information   Follow [facility] with [facility] [address] (Our office will call you for a follow-up appointment. Please call the office if you have not heard from us within 3 days.)    Specialty:  Cardiology   Contact information:   [address], [address] [place] [address] [phone]        Duration of Discharge Encounter: Greater than 30 minutes including physician and PA time.  Signed, Ronie Spiesayna Taia Bramlett PA-C 04/10/2014, 9:47 AM

## 2014-04-19 ENCOUNTER — Telehealth: Payer: Self-pay | Admitting: Cardiology

## 2014-04-19 DIAGNOSIS — I5022 Chronic systolic (congestive) heart failure: Secondary | ICD-10-CM

## 2014-04-19 DIAGNOSIS — I10 Essential (primary) hypertension: Secondary | ICD-10-CM

## 2014-04-19 DIAGNOSIS — I2510 Atherosclerotic heart disease of native coronary artery without angina pectoris: Secondary | ICD-10-CM

## 2014-04-19 NOTE — Telephone Encounter (Signed)
Please call,said she needed to ask you a question.

## 2014-04-19 NOTE — Telephone Encounter (Signed)
Returned call to patient no answer.LMTC. 

## 2014-04-20 NOTE — Telephone Encounter (Signed)
Ok to increase the lasix to 40 mg daily for 3 days Check BMET in one week Is she being seen soon.

## 2014-04-20 NOTE — Telephone Encounter (Signed)
Returning your call. °

## 2014-04-20 NOTE — Telephone Encounter (Signed)
Returned call to patient Kim FredricksonLori Gerhardt NP advised to increase lasix to 40 mg daily for 3 days only.Bmet scheduled [facility]/14 at [facility].Follow up appointment scheduled with Lawson FiscalLori 05/25/14 at 1:[facility].Advised to call sooner if needed.

## 2014-04-20 NOTE — Telephone Encounter (Signed)
Returned call to patient she stated she is retaining fluid in her breast for the past 2 days.No sob.No swelling in feet and legs just her breast.Stated she is taking lasix 20 mg daily would like to know if she can increase.Advised Dr.Jordan out of office.Will send message to Norma FredricksonLori Gerhardt NP for advice.

## 2014-04-22 NOTE — Telephone Encounter (Signed)
Spoke to patient she stated swelling in breast much better.Advised to keep appointments as planned.

## 2014-04-26 ENCOUNTER — Encounter: Payer: Self-pay | Admitting: Cardiology

## 2014-04-26 NOTE — Telephone Encounter (Signed)
This encounter was created in error - please disregard.

## 2014-04-26 NOTE — Telephone Encounter (Signed)
Pt said she was told to have her lab work here,she got a reminder from her daughter and it in the computer to go to [address] at [employer]10:20 tomorrow.Which one is correct?

## 2014-04-27 ENCOUNTER — Other Ambulatory Visit: Payer: Medicare Other

## 2014-04-27 DIAGNOSIS — I10 Essential (primary) hypertension: Secondary | ICD-10-CM

## 2014-04-27 DIAGNOSIS — I2510 Atherosclerotic heart disease of native coronary artery without angina pectoris: Secondary | ICD-10-CM

## 2014-04-27 DIAGNOSIS — I5022 Chronic systolic (congestive) heart failure: Secondary | ICD-10-CM

## 2014-04-27 LAB — BASIC METABOLIC PANEL
BUN: 21 mg/dL (ref 6–23)
CO2: 28 mEq/L (ref 19–32)
Calcium: 9.3 mg/dL (ref 8.4–10.5)
Chloride: 103 mEq/L (ref 96–112)
Creat: 1.01 mg/dL (ref 0.50–1.10)
Glucose, Bld: 77 mg/dL (ref 70–99)
Potassium: 4 mEq/L (ref 3.5–5.3)
Sodium: 141 mEq/L (ref 135–145)

## 2014-04-29 ENCOUNTER — Other Ambulatory Visit: Payer: Self-pay | Admitting: Cardiology

## 2014-05-10 ENCOUNTER — Ambulatory Visit (INDEPENDENT_AMBULATORY_CARE_PROVIDER_SITE_OTHER): Payer: Medicare Other | Admitting: *Deleted

## 2014-05-10 DIAGNOSIS — I4891 Unspecified atrial fibrillation: Secondary | ICD-10-CM

## 2014-05-10 DIAGNOSIS — I480 Paroxysmal atrial fibrillation: Secondary | ICD-10-CM

## 2014-05-10 LAB — MDC_IDC_ENUM_SESS_TYPE_INCLINIC
Battery Voltage: 2.9 V
Brady Statistic AS VP Percent: 0.02 %
Brady Statistic AS VS Percent: 98.82 %
Brady Statistic RA Percent Paced: 1.16 %
Brady Statistic RV Percent Paced: 0.02 %
Date Time Interrogation Session: 20150803142444
Lead Channel Impedance Value: 568 Ohm
Lead Channel Pacing Threshold Amplitude: 1 V
Lead Channel Pacing Threshold Pulse Width: 0.4 ms
Lead Channel Sensing Intrinsic Amplitude: 0.9 mV
Lead Channel Sensing Intrinsic Amplitude: 20.3 mV
Lead Channel Setting Sensing Sensitivity: 0.9 mV
MDC IDC MSMT LEADCHNL RA IMPEDANCE VALUE: 376 Ohm
MDC IDC MSMT LEADCHNL RA PACING THRESHOLD AMPLITUDE: 2.5 V
MDC IDC MSMT LEADCHNL RA PACING THRESHOLD PULSEWIDTH: 1 ms
MDC IDC SET LEADCHNL RA PACING AMPLITUDE: 4 V
MDC IDC SET LEADCHNL RV PACING AMPLITUDE: 2 V
MDC IDC SET LEADCHNL RV PACING PULSEWIDTH: 0.4 ms
MDC IDC STAT BRADY AP VP PERCENT: 0 %
MDC IDC STAT BRADY AP VS PERCENT: 1.16 %
Zone Setting Detection Interval: 350 ms
Zone Setting Detection Interval: 360 ms

## 2014-05-10 NOTE — Progress Notes (Signed)
Pacemaker check in clinic. Normal device function. Thresholds, sensing, impedances consistent with previous measurements. Device programmed to maximize longevity. No mode switch or high ventricular rates noted. Device programmed at appropriate safety margins. Histogram distribution appropriate for patient activity level. Device programmed to optimize intrinsic conduction. Battery @2 .90V. ROV w/ device clinic 08/12/14 for battery check & w/ JA 2/16.

## 2014-05-24 ENCOUNTER — Other Ambulatory Visit: Payer: Self-pay | Admitting: Cardiology

## 2014-05-24 ENCOUNTER — Encounter: Payer: Self-pay | Admitting: Internal Medicine

## 2014-05-25 ENCOUNTER — Ambulatory Visit (INDEPENDENT_AMBULATORY_CARE_PROVIDER_SITE_OTHER): Payer: Medicare Other | Admitting: Nurse Practitioner

## 2014-05-25 ENCOUNTER — Encounter: Payer: Self-pay | Admitting: Nurse Practitioner

## 2014-05-25 VITALS — BP 120/70 | HR 68 | Ht 65.0 in | Wt 164.1 lb

## 2014-05-25 DIAGNOSIS — R0600 Dyspnea, unspecified: Secondary | ICD-10-CM

## 2014-05-25 DIAGNOSIS — I2510 Atherosclerotic heart disease of native coronary artery without angina pectoris: Secondary | ICD-10-CM

## 2014-05-25 DIAGNOSIS — R0989 Other specified symptoms and signs involving the circulatory and respiratory systems: Secondary | ICD-10-CM

## 2014-05-25 DIAGNOSIS — R0609 Other forms of dyspnea: Secondary | ICD-10-CM

## 2014-05-25 DIAGNOSIS — I480 Paroxysmal atrial fibrillation: Secondary | ICD-10-CM

## 2014-05-25 DIAGNOSIS — I4891 Unspecified atrial fibrillation: Secondary | ICD-10-CM

## 2014-05-25 LAB — BRAIN NATRIURETIC PEPTIDE: Pro B Natriuretic peptide (BNP): 119 pg/mL — ABNORMAL HIGH (ref 0.0–100.0)

## 2014-05-25 LAB — BASIC METABOLIC PANEL
BUN: 19 mg/dL (ref 6–23)
CO2: 30 mEq/L (ref 19–32)
Calcium: 9.3 mg/dL (ref 8.4–10.5)
Chloride: 102 mEq/L (ref 96–112)
Creatinine, Ser: 1 mg/dL (ref 0.4–1.2)
GFR: 58.42 mL/min — ABNORMAL LOW (ref >60.00)
Glucose, Bld: 86 mg/dL (ref 70–99)
Potassium: 4.1 mEq/L (ref 3.5–5.1)
Sodium: 140 mEq/L (ref 135–145)

## 2014-05-25 NOTE — Patient Instructions (Signed)
We will check lab today -  We will call you with those results and then decide about increasing your fluid pills  For now, stay on your current medicines. Stay off the potassium for now.  See Dr. Johney FrameAllred in February.  Keep trying to restrict your salt  Call the [facility] office at [phone] if you have any questions, problems or concerns.

## 2014-05-25 NOTE — Progress Notes (Signed)
[name] Date of Birth: 11-19-1929 Medical Record #[id number]  History of Present Illness: Ms. Dockery is seen back today for a follow up visit. This is a 6 month check. Seen for Dr. Johney Frame. Former patient of Dr. Elvis Coil. She has CAD with CABG x 2 with MV repair and MAZE in 2009, AF - maintained on Tikosyn, chronic systolic HF with EF of 35% per echo from May of 2014, tachybrady syndrome with PPM in 2009, depression, HLD and HTN.   In the hospital back in November of 2014 with worsening dyspnea and chest pressure - found to be profoundly anemia and having an acute systolic HF event. Anemia was felt to be the driving force for her heart failure a that time. Apparently very depressed and lonely - chronic issue - was quite vocal about wanting to die. Was not felt to be suicidal. Her coumadin was stopped. She refused GI evaluation.   Was last seen by Dr. Johney Frame in February of 2015 - declined EKG. Was felt to be doing ok.   Admitted in early July of 2015 with acute systolic heart failure - had had too much salt - still very clear that she did not wish to improve any aspect of her care and was ready to die. Palliative services discussed. Was in sinus rhythm. Refused anticoagulation despite recommendation.   Called last month with some swelling - took some extra Lasix for a couple of days. Follow up visit made with me.   Comes in today. Here with her son. She says she does not know why she is here. Says she has swelling in her breasts and thighs. Not short of breath. No swelling in her legs. No chest pain. Says that "unfortunately, I am still alive". Probably still gets too much salt. Likes to eat out at [place] on occasion. Weight is up. Sounds like she is more sedentary as well. She does not know why her Lisinopril dose is higher and she is not taking her potassium any more - not sure why. Probably has been off of her potassium a couple of months.    Current Outpatient Prescriptions    Medication Sig Dispense Refill  . azelastine (ASTELIN) 0.1 % nasal spray Place 1 spray into both nostrils 2 (two) times daily. Use in each nostril as directed      . Cholecalciferol (VITAMIN D) 2000 UNITS tablet Take 2,000 Units by mouth daily.        Marland Kitchen dofetilide (TIKOSYN) 250 MCG capsule Take 250 mcg by mouth 2 (two) times daily.      Marland Kitchen ezetimibe (ZETIA) 10 MG tablet Take 10 mg by mouth daily.      . furosemide (LASIX) 20 MG tablet Take 1 tablet (20 mg total) by mouth daily.  30 tablet  6  . lisinopril (PRINIVIL,ZESTRIL) 5 MG tablet Take 1 tablet (5 mg total) by mouth daily.  30 tablet  6  . metoprolol tartrate (LOPRESSOR) 25 MG tablet Take 12.5 mg by mouth 2 (two) times daily.      Marland Kitchen OVER THE COUNTER MEDICATION Place 1 drop into both eyes daily as needed (for itchy eyes). Eye Drops      . pantoprazole (PROTONIX) 40 MG tablet Take 40 mg by mouth daily.      . potassium chloride (K-DUR,KLOR-CON) 10 MEQ tablet Take 10 mEq by mouth daily.        No current facility-administered medications for this visit.    Allergies  Allergen Reactions  . Amiodarone  unknown  . Diphenhydramine Hcl Other (See Comments)    unknown    Past Medical History  Diagnosis Date  . Coronary artery disease     a. 1996 s/p PS BMS;  b. 03/2007 NSTEMI/PCI: 3.0x24 and 3.0x28 Taxus DES' t RCA; c. 05/2008 CABG x 2 with MV repair and Maze (LIMA->LAD, VG->PDA)  . Atrial fibrillation     a. in sinus on Tikosyn and coumadin;  b. s/p MAZE @ time of MVR/CABG 05/2008;  c. h/o pulm toxicity while on amio.  . Chronic systolic CHF (congestive heart failure)     a. 02/2013 Echo: EF 35% inflat AK, mod to sev calcified MV annulus w/o MS, mod dil LA, PASP 33mmHg.  Marland Kitchen. Hypertension   . Anxiety   . Depression   . Hyperlipidemia   . Mitral insufficiency     a. 05/2008 s/p MV repair with [employer]esce[employer] MV ring (model 5200, 26mm ser # [id number].  Marland Kitchen. IDA (iron deficiency anemia)   . Subarachnoid hemorrhage     a. in setting of  syncope/fall/digoxin toxicity 11/2007.  Marland Kitchen. LBBB (left bundle branch block)   . Chronic anticoagulation     coumadin  . High risk medication use     Tikosyn  . Tachycardia-bradycardia     a. 05/2008 s/p MDT [id number] Enrhythm DC PPM, ser # [id number] H.    Past Surgical History  Procedure Laterality Date  . Cardiac catheterization  03/19/2007    EF 45%. THERE IS 2+ MITRAL INSUFFICIENCY. THERE IS MODERATE INFERIOR WALL HYPOKINESIA WITH OVERALL MILD TO MODERATE LV DYSFUNCTION  . Coronary stent placement  1996, AND 03/2007    RIGHT CORONARY  . Removal of colon polyp      AND A LARGE CECAL POLYP  . Coronary artery bypass graft  04/2008    INCLUDING A MITRAL VALVE REPAIR AND A LEFT SIDED MAZE PROCEDURE  . Coronary artery bypass graft      X2 with LIMA to LAD and SVG to PD and WITH A LEFT SIDED MAZE PROCEDURE & MITRAL VALVE REPAIR. THIS INCLUDED AN LIMA GRAFT TO THE LAD, AND A SAPHENOUS VEIN GRAFT TO THE PDA.  Marland Kitchen. Transthoracic echocardiogram  06/21/2008    EF 35%  . Pacemaker insertion  05/19/2008    MDT EnRhythm implanted for mobitz II AV block by Dr Reyes IvanKersey  . Mitral valve repair      History  Smoking status  . Current Some Day Smoker  . Last Attempt to Quit: 05/06/1996  Smokeless tobacco  . Never Used    History  Alcohol Use No    Family History  Problem Relation Age of Onset  . Heart attack Mother   . Cancer Father     Review of Systems: The review of systems is per the HPI.  All other systems were reviewed and are negative.  Physical Exam: BP 120/70  Pulse 68  Ht 5\' 5"  (1.651 m)  Wt 164 lb 1.9 oz (74.444 kg)  BMI 27.31 kg/m2 Patient is alert and in no acute distress. Her affect is quite flat. Her weight is up. Skin is warm and dry. Color is normal.  HEENT is unremarkable. Normocephalic/atraumatic. PERRL. Sclera are nonicteric. Neck is supple. No masses. No JVD. Lungs are clear. Cardiac exam shows a regular rate and rhythm. Outflow murmur noted. Abdomen is soft. Extremities are  without edema. Gait and ROM are intact. No gross neurologic deficits noted.  Wt Readings from Last 3 Encounters:  05/25/14 164 lb 1.9 oz (74.444  kg)  04/10/14 162 lb 14.4 oz (73.891 kg)  11/09/13 153 lb (69.4 kg)    LABORATORY DATA/PROCEDURES:  Lab Results  Component Value Date   WBC 6.8 04/10/2014   HGB 12.2 04/10/2014   HCT 38.9 04/10/2014   PLT 217 04/10/2014   GLUCOSE 77 04/27/2014   CHOL 204* 05/07/2012   TRIG 196.0* 05/07/2012   HDL 40.50 05/07/2012   LDLDIRECT 136.5 05/07/2012   ALT 11 11/23/2013   AST 18 11/23/2013   NA 141 04/27/2014   K 4.0 04/27/2014   CL 103 04/27/2014   CREATININE 1.01 04/27/2014   BUN 21 04/27/2014   CO2 28 04/27/2014   TSH 0.784 04/08/2014   INR 2.14* 08/10/2013   HGBA1C 5.7* 02/21/2013    BNP (last 3 results)  Recent Labs  08/11/13 0505 11/23/13 0729 04/08/14 1123  PROBNP 1302.0* 2837.0* 2409.0*    Echo Study Conclusions from May 2014  - Left ventricle: There is calcification, thinning, and akinesis of the inferolateral wall. The cavity size was normal. Wall thickness was increased in a pattern of mild LVH. The estimated ejection fraction was 35%. - Aortic valve: Sclerosis without stenosis. No significant regurgitation. - Mitral valve: Moderately to severely calcified annulus. But no significant mitral stenosis. - Left atrium: The atrium was moderately dilated. - Right ventricle: Poorly visualized. The cavity size was normal. Systolic function was normal. - Pulmonary arteries: PA peak pressure: 33mm Hg (S).   Assessment / Plan:  1. Ischemic CM with EF of 35% - her weight is up but she seems pretty compensated to me. Do not see any swelling in her breasts or thighs - seems to be more just adipose tissue. We will recheck her labs today to include a BMET and BNP - will adjust lasix if needed. I have left her on her current regimen for now.   2. CAD  3. Depression -  Still quite a factor in her care.   4. PAF - on Tikosyn - remains in sinus by  EKG today. Refuses anticoagulation.   5. Anemia  I do not think I have much to offer her. She is pretty resistant to care. I think there is a high likelihood of readmission and I do not think there is much we can do given her resistance. I am actually surprised that she came today. She refuses to see primary care or reestablish with Dr. Duanne Guess (who she did like apparently). Overall situation tenuous. No change in current regimen. See back in February as planned.   Patient is agreeable to this plan and will call if any problems develop in the interim.   [name], RN, ANP-C [facility] [address] [place], [place]  [address] [phone]

## 2014-05-26 ENCOUNTER — Telehealth: Payer: Self-pay | Admitting: Nurse Practitioner

## 2014-05-26 NOTE — Telephone Encounter (Signed)
Pt notified about recent labs from 8/18 per Norma FredricksonLori Gerhardt NP.  Informed pt that per Lawson FiscalLori her labs looked ok. Her fluid level test is basically normal.  Informed the pt that Lawson FiscalLori recommends for her to stay on her current dose of Lasix for now and really try to restrict salt intake, and weigh herself daily.  Told the pt to write her weights down and report this to our office in 2 weeks.  Pt verbalized understanding and agrees with this plan.

## 2014-05-26 NOTE — Telephone Encounter (Signed)
New message  ° ° °Patient calling stating someone called her today.  °

## 2014-08-12 ENCOUNTER — Ambulatory Visit (INDEPENDENT_AMBULATORY_CARE_PROVIDER_SITE_OTHER): Payer: Medicare Other | Admitting: *Deleted

## 2014-08-12 ENCOUNTER — Other Ambulatory Visit: Payer: Self-pay | Admitting: Internal Medicine

## 2014-08-12 DIAGNOSIS — Z950 Presence of cardiac pacemaker: Secondary | ICD-10-CM

## 2014-08-12 DIAGNOSIS — I4891 Unspecified atrial fibrillation: Secondary | ICD-10-CM

## 2014-08-12 LAB — MDC_IDC_ENUM_SESS_TYPE_INCLINIC
Brady Statistic AP VP Percent: 0 %
Brady Statistic AP VS Percent: 2.93 %
Brady Statistic AS VP Percent: 0.03 %
Brady Statistic AS VS Percent: 97.04 %
Brady Statistic RV Percent Paced: 0.03 %
Date Time Interrogation Session: 20151105143804
Lead Channel Impedance Value: 368 Ohm
Lead Channel Setting Pacing Amplitude: 4 V
Lead Channel Setting Sensing Sensitivity: 0.9 mV
MDC IDC MSMT BATTERY VOLTAGE: 2.92 V
MDC IDC MSMT LEADCHNL RA SENSING INTR AMPL: 1.232 mV
MDC IDC MSMT LEADCHNL RV IMPEDANCE VALUE: 528 Ohm
MDC IDC MSMT LEADCHNL RV SENSING INTR AMPL: 20.2664
MDC IDC SET LEADCHNL RV PACING AMPLITUDE: 2 V
MDC IDC SET LEADCHNL RV PACING PULSEWIDTH: 0.4 ms
MDC IDC STAT BRADY RA PERCENT PACED: 2.93 %
Zone Setting Detection Interval: 350 ms
Zone Setting Detection Interval: 360 ms

## 2014-08-12 MED ORDER — METOPROLOL TARTRATE 25 MG PO TABS: 12.5000 mg | ORAL_TABLET | Freq: Two times a day (BID) | ORAL | Status: DC

## 2014-08-12 MED ORDER — PANTOPRAZOLE SODIUM 40 MG PO TBEC: 40.0000 mg | DELAYED_RELEASE_TABLET | Freq: Every day | ORAL | Status: DC

## 2014-08-12 MED ORDER — DOFETILIDE 250 MCG PO CAPS: 250.0000 ug | ORAL_CAPSULE | Freq: Two times a day (BID) | ORAL | Status: DC

## 2014-08-12 NOTE — Progress Notes (Signed)
Battery only. Remaining voltage @2 .92V, ERI=2.81V. ROV w/ Dr. Johney FrameAllred 11/15/14.

## 2014-08-15 ENCOUNTER — Other Ambulatory Visit: Payer: Self-pay | Admitting: Internal Medicine

## 2014-08-24 ENCOUNTER — Encounter: Payer: Self-pay | Admitting: Internal Medicine

## 2014-08-29 ENCOUNTER — Other Ambulatory Visit: Payer: Self-pay

## 2014-08-29 MED ORDER — EZETIMIBE 10 MG PO TABS: 10.0000 mg | ORAL_TABLET | Freq: Every day | ORAL | Status: DC

## 2014-09-14 ENCOUNTER — Other Ambulatory Visit: Payer: Self-pay | Admitting: Internal Medicine

## 2014-09-20 ENCOUNTER — Other Ambulatory Visit: Payer: Self-pay | Admitting: *Deleted

## 2014-10-12 ENCOUNTER — Telehealth: Payer: Self-pay | Admitting: *Deleted

## 2014-10-12 NOTE — Telephone Encounter (Signed)
PA for Tikosyn faxed to [facility].

## 2014-10-22 ENCOUNTER — Other Ambulatory Visit: Payer: Self-pay | Admitting: *Deleted

## 2014-10-22 MED ORDER — DOFETILIDE 250 MCG PO CAPS: 250.0000 ug | ORAL_CAPSULE | Freq: Two times a day (BID) | ORAL | Status: DC

## 2014-11-05 ENCOUNTER — Other Ambulatory Visit: Payer: Self-pay | Admitting: Internal Medicine

## 2014-11-07 ENCOUNTER — Other Ambulatory Visit: Payer: Self-pay | Admitting: Internal Medicine

## 2014-11-15 ENCOUNTER — Encounter: Payer: Self-pay | Admitting: Internal Medicine

## 2014-11-15 ENCOUNTER — Ambulatory Visit (INDEPENDENT_AMBULATORY_CARE_PROVIDER_SITE_OTHER): Payer: Medicare Other | Admitting: Internal Medicine

## 2014-11-15 VITALS — BP 118/64 | HR 71 | Ht 65.0 in | Wt 173.8 lb

## 2014-11-15 DIAGNOSIS — I10 Essential (primary) hypertension: Secondary | ICD-10-CM

## 2014-11-15 DIAGNOSIS — I5022 Chronic systolic (congestive) heart failure: Secondary | ICD-10-CM

## 2014-11-15 DIAGNOSIS — I495 Sick sinus syndrome: Secondary | ICD-10-CM

## 2014-11-15 DIAGNOSIS — I480 Paroxysmal atrial fibrillation: Secondary | ICD-10-CM

## 2014-11-15 LAB — BASIC METABOLIC PANEL
BUN: 16 mg/dL (ref 6–23)
CALCIUM: 9.5 mg/dL (ref 8.4–10.5)
CHLORIDE: 105 meq/L (ref 96–112)
CO2: 26 mEq/L (ref 19–32)
Creatinine, Ser: 0.93 mg/dL (ref 0.40–1.20)
GFR: 61.26 mL/min (ref >60.00)
Glucose, Bld: 110 mg/dL — ABNORMAL HIGH (ref 70–99)
Potassium: 3.8 mEq/L (ref 3.5–5.1)
Sodium: 139 mEq/L (ref 135–145)

## 2014-11-15 LAB — CBC WITH DIFFERENTIAL/PLATELET
BASOS PCT: 0.5 % (ref 0.0–3.0)
Basophils Absolute: 0 10*3/uL (ref 0.0–0.1)
EOS ABS: 0.1 10*3/uL (ref 0.0–0.7)
EOS PCT: 1 % (ref 0.0–5.0)
HEMATOCRIT: 43.3 % (ref 36.0–46.0)
Hemoglobin: 14.4 g/dL (ref 12.0–15.0)
Lymphocytes Relative: 21.5 % (ref 12.0–46.0)
Lymphs Abs: 2 10*3/uL (ref 0.7–4.0)
MCHC: 33.3 g/dL (ref 30.0–36.0)
MCV: 86 fl (ref 78.0–100.0)
Monocytes Absolute: 0.8 10*3/uL (ref 0.1–1.0)
Monocytes Relative: 8.4 % (ref 3.0–12.0)
NEUTROS PCT: 68.6 % (ref 43.0–77.0)
Neutro Abs: 6.2 10*3/uL (ref 1.4–7.7)
Platelets: 245 10*3/uL (ref 150.0–400.0)
RBC: 5.03 Mil/uL (ref 3.87–5.11)
RDW: 16.4 % — AB (ref 11.5–15.5)
WBC: 9.1 10*3/uL (ref 4.0–10.5)

## 2014-11-15 LAB — MAGNESIUM: MAGNESIUM: 2.3 mg/dL (ref 1.5–2.5)

## 2014-11-15 NOTE — Patient Instructions (Signed)
Your physician recommends that you schedule a follow-up appointment in: 3 months with the device clinic, 6 months with Norma FredricksonLori Gerhardt, NP and 12 months with Dr Johney FrameAllred  Your physician recommends that you return for lab work today: BMP/MAG/CBC

## 2014-11-15 NOTE — Progress Notes (Signed)
PCP:  She declines primary care,  Has seen Dr Duanne Guess in the past (remotely) Primary Cardiologist:  Dr [place]  The patient presents today for routine electrophysiology followup.  She has a h/o atrial fibrillation and bradycardia s/p PPM implantation (MDT) implantation 05/19/2008 by Dr Reyes Ivan.  She previously had anemia for which she declined workup.  She has declined anticoagulation with her afib.  She is on tikosyn and seems to be compliant with this.  Today, she denies symptoms of palpitations, chest pain, shortness of breath, orthopnea, PND, lower extremity edema, dizziness, presyncope, syncope, or neurologic sequela. The patient is tolerating medications without difficulties and is otherwise without complaint today.    Past Medical History  Diagnosis Date  . Coronary artery disease     a. 1996 s/p PS BMS;  b. 03/2007 NSTEMI/PCI: 3.0x24 and 3.0x28 Taxus DES' t RCA; c. 05/2008 CABG x 2 with MV repair and Maze (LIMA->LAD, VG->PDA)  . Atrial fibrillation     a. in sinus on Tikosyn and coumadin;  b. s/p MAZE @ time of MVR/CABG 05/2008;  c. h/o pulm toxicity while on amio.  . Chronic systolic CHF (congestive heart failure)     a. 02/2013 Echo: EF 35% inflat AK, mod to sev calcified MV annulus w/o MS, mod dil LA, PASP .  Marland Kitchen Hypertension   . Anxiety   . Depression   . Hyperlipidemia   . Mitral insufficiency     a. 05/2008 s/p MV repair with [employer] MV ring (model 5200, 26mm ser # [id number].  Marland Kitchen IDA (iron deficiency anemia)   . Subarachnoid hemorrhage     a. in setting of syncope/fall/digoxin toxicity 11/2007.  Marland Kitchen LBBB (left bundle branch block)   . Chronic anticoagulation     coumadin  . High risk medication use     Tikosyn  . Tachycardia-bradycardia     a. 05/2008 s/p MDT P1501DR Enrhythm DC PPM, ser # [id number].   Past Surgical History  Procedure Laterality Date  . Cardiac catheterization  03/19/2007    EF 45%. THERE IS 2+ MITRAL INSUFFICIENCY. THERE IS MODERATE INFERIOR WALL  HYPOKINESIA WITH OVERALL MILD TO MODERATE LV DYSFUNCTION  . Coronary stent placement  1996, AND 03/2007    RIGHT CORONARY  . Removal of colon polyp      AND A LARGE CECAL POLYP  . Coronary artery bypass graft  04/2008    INCLUDING A MITRAL VALVE REPAIR AND A LEFT SIDED MAZE PROCEDURE  . Coronary artery bypass graft      X2 with LIMA to LAD and SVG to PD and WITH A LEFT SIDED MAZE PROCEDURE & MITRAL VALVE REPAIR. THIS INCLUDED AN LIMA GRAFT TO THE LAD, AND A SAPHENOUS VEIN GRAFT TO THE PDA.  Marland Kitchen Transthoracic echocardiogram  06/21/2008    EF 35%  . Pacemaker insertion  05/19/2008    MDT EnRhythm implanted for mobitz II AV block by Dr Reyes Ivan  . Mitral valve repair      Current Outpatient Prescriptions  Medication Sig Dispense Refill  . Cholecalciferol (VITAMIN D) 2000 UNITS tablet Take 2,000 Units by mouth daily.      Marland Kitchen dofetilide (TIKOSYN) 250 MCG capsule Take 1 capsule (250 mcg total) by mouth 2 (two) times daily. 180 capsule 0  . ezetimibe (ZETIA) 10 MG tablet Take 1 tablet (10 mg total) by mouth daily. 90 tablet 1  . furosemide (LASIX) 20 MG tablet TAKE 1 TABLET (20 MG TOTAL) BY MOUTH DAILY. 90 tablet 1  . lisinopril (PRINIVIL,ZESTRIL)  2.5 MG tablet TAKE 1 TABLET BY MOUTH EVERY DAY 90 tablet 1  . metoprolol tartrate (LOPRESSOR) 25 MG tablet TAKE 1/2 TABLET BY MOUTH TWICE DAILY 90 tablet 0  . OVER THE COUNTER MEDICATION Place 1 drop into both eyes daily as needed (for itchy eyes). Eye Drops    . pantoprazole (PROTONIX) 40 MG tablet Take 1 tablet (40 mg total) by mouth daily. 90 tablet 0   No current facility-administered medications for this visit.    Allergies  Allergen Reactions  . Amiodarone     unknown  . Atorvastatin     Myalgia  . Diphenhydramine Hcl Other (See Comments)    unknown  . Rosuvastatin     myalgia    History   Social History  . Marital Status: Married    Spouse Name: N/A    Number of Children: N/A  . Years of Education: N/A   Occupational History  . Not  on file.   Social History Main Topics  . Smoking status: Current Some Day Smoker    Last Attempt to Quit: 05/06/1996  . Smokeless tobacco: Never Used  . Alcohol Use: No  . Drug Use: No  . Sexual Activity: No   Other Topics Concern  . Not on file   Social History Narrative    Family History  Problem Relation Age of Onset  . Heart attack Mother   . Cancer Father    ROS-all systems are reviewed and negative except as per HPI above  Physical Exam: Filed Vitals:   11/15/14 1122  BP: 118/64  Pulse: 71  Height: 5\' 5"  (1.651 m)  Weight: 173 lb 12.8 oz (78.835 kg)    GEN- The patient is well appearing, alert and oriented x 3 today.   Head- normocephalic, atraumatic Eyes-  Sclera clear, conjunctiva pink Ears- hearing intact Oropharynx- clear Neck- supple Lungs- Clear to ausculation bilaterally, normal work of breathing Chest- pacemaker pocket is well healed Heart- Regular rate and rhythm, no murmurs, rubs or gallops, PMI not laterally displaced GI- soft, NT, ND, + BS Extremities- no clubbing, cyanosis, or edema  Pacemaker interrogation- reviewed in detail today,  See PACEART report ekg today reveals sinus rhythm with LBBB  Assessment and Plan:  1. Sick sinus syndrome Normal pacemaker function See Arita Missace Art report No changes today As she is 97% ASVS, I have turned rate response off otday  2. afib Well controlled Qt is stable Off of anticoagulation due to recent anemia Bmet, mg, cbc today and every 6 monhts on tikosyn  3. CAD Stable No change required today  4. HTN Stable No change required today  5. Chronic systolic dysfunction Appears euvolemic No changes today Further medicine therapy is limited by blood pressure  Return to see Lawson FiscalLori in 6 months I will see in a year She is clear in her preference for conservative approach.

## 2014-11-16 LAB — MDC_IDC_ENUM_SESS_TYPE_INCLINIC
Battery Voltage: 2.9 V
Brady Statistic AP VP Percent: 0 %
Brady Statistic AP VS Percent: 3.02 %
Brady Statistic AS VS Percent: 96.95 %
Brady Statistic RA Percent Paced: 3.02 %
Date Time Interrogation Session: 20160208172937
Lead Channel Impedance Value: 560 Ohm
Lead Channel Pacing Threshold Amplitude: 2 V
Lead Channel Pacing Threshold Pulse Width: 0.4 ms
Lead Channel Pacing Threshold Pulse Width: 1 ms
Lead Channel Sensing Intrinsic Amplitude: 0.88 mV
Lead Channel Sensing Intrinsic Amplitude: 20.2664
Lead Channel Setting Pacing Amplitude: 2 V
Lead Channel Setting Pacing Pulse Width: 0.4 ms
MDC IDC MSMT LEADCHNL RA IMPEDANCE VALUE: 372 Ohm
MDC IDC MSMT LEADCHNL RV PACING THRESHOLD AMPLITUDE: 0.5 V
MDC IDC SET LEADCHNL RA PACING AMPLITUDE: 4 V
MDC IDC SET LEADCHNL RV SENSING SENSITIVITY: 0.9 mV
MDC IDC SET ZONE DETECTION INTERVAL: 350 ms
MDC IDC SET ZONE DETECTION INTERVAL: 360 ms
MDC IDC STAT BRADY AS VP PERCENT: 0.02 %
MDC IDC STAT BRADY RV PERCENT PACED: 0.03 %

## 2014-11-29 ENCOUNTER — Other Ambulatory Visit: Payer: Self-pay | Admitting: Internal Medicine

## 2014-12-07 ENCOUNTER — Ambulatory Visit: Payer: Self-pay | Admitting: Cardiology

## 2014-12-07 DIAGNOSIS — I480 Paroxysmal atrial fibrillation: Secondary | ICD-10-CM

## 2015-02-02 ENCOUNTER — Other Ambulatory Visit: Payer: Self-pay | Admitting: Internal Medicine

## 2015-02-03 ENCOUNTER — Other Ambulatory Visit: Payer: Self-pay | Admitting: Internal Medicine

## 2015-02-07 ENCOUNTER — Other Ambulatory Visit: Payer: Self-pay | Admitting: Internal Medicine

## 2015-02-07 MED ORDER — DOFETILIDE 250 MCG PO CAPS: ORAL_CAPSULE | ORAL | Status: DC

## 2015-02-16 ENCOUNTER — Ambulatory Visit (INDEPENDENT_AMBULATORY_CARE_PROVIDER_SITE_OTHER): Payer: Medicare Other | Admitting: *Deleted

## 2015-02-16 DIAGNOSIS — Z950 Presence of cardiac pacemaker: Secondary | ICD-10-CM

## 2015-02-16 DIAGNOSIS — I495 Sick sinus syndrome: Secondary | ICD-10-CM | POA: Diagnosis not present

## 2015-02-16 LAB — CUP PACEART INCLINIC DEVICE CHECK
Battery Voltage: 2.9 V
Brady Statistic AP VS Percent: 1.49 %
Brady Statistic AS VS Percent: 98.48 %
Brady Statistic RA Percent Paced: 1.5 %
Lead Channel Impedance Value: 372 Ohm
Lead Channel Impedance Value: 576 Ohm
Lead Channel Pacing Threshold Amplitude: 1 V
Lead Channel Pacing Threshold Amplitude: 2.5 V
Lead Channel Sensing Intrinsic Amplitude: 1.1 mV
Lead Channel Setting Pacing Amplitude: 2 V
Lead Channel Setting Pacing Amplitude: 4 V
Lead Channel Setting Sensing Sensitivity: 0.9 mV
MDC IDC MSMT LEADCHNL RA PACING THRESHOLD PULSEWIDTH: 1 ms
MDC IDC MSMT LEADCHNL RV PACING THRESHOLD PULSEWIDTH: 0.4 ms
MDC IDC MSMT LEADCHNL RV SENSING INTR AMPL: 20.3 mV
MDC IDC SESS DTM: 20160511112859
MDC IDC SET LEADCHNL RV PACING PULSEWIDTH: 0.4 ms
MDC IDC SET ZONE DETECTION INTERVAL: 360 ms
MDC IDC STAT BRADY AP VP PERCENT: 0 %
MDC IDC STAT BRADY AS VP PERCENT: 0.03 %
MDC IDC STAT BRADY RV PERCENT PACED: 0.03 %
Zone Setting Detection Interval: 350 ms

## 2015-02-16 MED ORDER — FUROSEMIDE 20 MG PO TABS: 20.0000 mg | ORAL_TABLET | Freq: Every day | ORAL | Status: DC

## 2015-02-16 NOTE — Progress Notes (Signed)
Pacemaker check in clinic. Normal device function. Thresholds, sensing, impedances consistent with previous measurements. Device programmed to maximize longevity. No mode switches. 1 NSVT--- 6 beats. Device programmed at appropriate safety margins. Histogram distribution appropriate for patient activity level. Device programmed to optimize intrinsic conduction. Remaining power 2.90V, ERI=2.81V. Monthly battery check beginning 05/16/15. ROV w/ JA 2/17.

## 2015-02-22 ENCOUNTER — Encounter: Payer: Self-pay | Admitting: *Deleted

## 2015-03-08 ENCOUNTER — Encounter: Payer: Self-pay | Admitting: Internal Medicine

## 2015-05-16 ENCOUNTER — Ambulatory Visit: Payer: Medicare Other | Admitting: Nurse Practitioner

## 2015-05-18 ENCOUNTER — Encounter: Payer: Self-pay | Admitting: Nurse Practitioner

## 2015-05-18 ENCOUNTER — Ambulatory Visit (INDEPENDENT_AMBULATORY_CARE_PROVIDER_SITE_OTHER): Payer: Medicare Other | Admitting: *Deleted

## 2015-05-18 ENCOUNTER — Ambulatory Visit (INDEPENDENT_AMBULATORY_CARE_PROVIDER_SITE_OTHER): Payer: Medicare Other | Admitting: Nurse Practitioner

## 2015-05-18 VITALS — BP 100/60 | HR 69 | Ht 65.0 in | Wt 169.8 lb

## 2015-05-18 DIAGNOSIS — I495 Sick sinus syndrome: Secondary | ICD-10-CM

## 2015-05-18 DIAGNOSIS — I10 Essential (primary) hypertension: Secondary | ICD-10-CM | POA: Diagnosis not present

## 2015-05-18 DIAGNOSIS — Z950 Presence of cardiac pacemaker: Secondary | ICD-10-CM | POA: Diagnosis not present

## 2015-05-18 DIAGNOSIS — I480 Paroxysmal atrial fibrillation: Secondary | ICD-10-CM | POA: Diagnosis not present

## 2015-05-18 DIAGNOSIS — I4891 Unspecified atrial fibrillation: Secondary | ICD-10-CM | POA: Diagnosis not present

## 2015-05-18 LAB — CUP PACEART INCLINIC DEVICE CHECK
Battery Voltage: 2.9 V
Brady Statistic AP VS Percent: 3.06 %
Brady Statistic AS VP Percent: 0.02 %
Brady Statistic RA Percent Paced: 3.06 %
Brady Statistic RV Percent Paced: 0.03 %
Date Time Interrogation Session: 20160810152901
Lead Channel Pacing Threshold Amplitude: 1 V
Lead Channel Pacing Threshold Pulse Width: 0.4 ms
Lead Channel Pacing Threshold Pulse Width: 1 ms
Lead Channel Sensing Intrinsic Amplitude: 1.32 mV
Lead Channel Setting Pacing Pulse Width: 0.4 ms
Lead Channel Setting Sensing Sensitivity: 0.9 mV
MDC IDC MSMT LEADCHNL RA IMPEDANCE VALUE: 372 Ohm
MDC IDC MSMT LEADCHNL RA PACING THRESHOLD AMPLITUDE: 2 V
MDC IDC MSMT LEADCHNL RV IMPEDANCE VALUE: 568 Ohm
MDC IDC MSMT LEADCHNL RV SENSING INTR AMPL: 20.2664
MDC IDC SET LEADCHNL RA PACING AMPLITUDE: 4 V
MDC IDC SET LEADCHNL RV PACING AMPLITUDE: 2 V
MDC IDC SET ZONE DETECTION INTERVAL: 350 ms
MDC IDC STAT BRADY AP VP PERCENT: 0 %
MDC IDC STAT BRADY AS VS PERCENT: 96.92 %
Zone Setting Detection Interval: 360 ms

## 2015-05-18 LAB — LIPID PANEL
Cholesterol: 204 mg/dL — ABNORMAL HIGH (ref 0–200)
HDL: 44.2 mg/dL (ref >39.00)
LDL Cholesterol: 123 mg/dL — ABNORMAL HIGH (ref 0–99)
NonHDL: 159.99
Total CHOL/HDL Ratio: 5
Triglycerides: 185 mg/dL — ABNORMAL HIGH (ref 0.0–149.0)
VLDL: 37 mg/dL (ref 0.0–40.0)

## 2015-05-18 LAB — CBC
HCT: 42.3 % (ref 36.0–46.0)
Hemoglobin: 13.9 g/dL (ref 12.0–15.0)
MCHC: 32.9 g/dL (ref 30.0–36.0)
MCV: 88.4 fl (ref 78.0–100.0)
Platelets: 267 10*3/uL (ref 150.0–400.0)
RBC: 4.78 Mil/uL (ref 3.87–5.11)
RDW: 15.7 % — ABNORMAL HIGH (ref 11.5–15.5)
WBC: 8.8 10*3/uL (ref 4.0–10.5)

## 2015-05-18 LAB — BASIC METABOLIC PANEL
BUN: 23 mg/dL (ref 6–23)
CO2: 32 mEq/L (ref 19–32)
Calcium: 9.5 mg/dL (ref 8.4–10.5)
Chloride: 102 mEq/L (ref 96–112)
Creatinine, Ser: 1 mg/dL (ref 0.40–1.20)
GFR: 56.27 mL/min — ABNORMAL LOW (ref >60.00)
Glucose, Bld: 86 mg/dL (ref 70–99)
Potassium: 3.9 mEq/L (ref 3.5–5.1)
Sodium: 141 mEq/L (ref 135–145)

## 2015-05-18 LAB — HEPATIC FUNCTION PANEL
ALT: 7 U/L (ref 0–35)
AST: 14 U/L (ref 0–37)
Albumin: 4.1 g/dL (ref 3.5–5.2)
Alkaline Phosphatase: 66 U/L (ref 39–117)
Bilirubin, Direct: 0.1 mg/dL (ref 0.0–0.3)
Total Bilirubin: 0.3 mg/dL (ref 0.2–1.2)
Total Protein: 7.7 g/dL (ref 6.0–8.3)

## 2015-05-18 LAB — MAGNESIUM: Magnesium: 2.3 mg/dL (ref 1.5–2.5)

## 2015-05-18 MED ORDER — LISINOPRIL 2.5 MG PO TABS: 2.5000 mg | ORAL_TABLET | Freq: Every day | ORAL | Status: DC

## 2015-05-18 MED ORDER — FUROSEMIDE 20 MG PO TABS: 20.0000 mg | ORAL_TABLET | Freq: Every day | ORAL | Status: DC

## 2015-05-18 MED ORDER — METOPROLOL TARTRATE 25 MG PO TABS: 12.5000 mg | ORAL_TABLET | Freq: Two times a day (BID) | ORAL | Status: DC

## 2015-05-18 NOTE — Patient Instructions (Addendum)
We will be checking the following labs today - BMET, CBC, Mg level, HPF and lipids   Medication Instructions:    Continue with your current medicines.     Testing/Procedures To Be Arranged:  N/A  Follow-Up:   See Dr. Johney Frame in 6 months    Other Special Instructions:   N/A  Call the [facility] office at [phone] if you have any questions, problems or concerns.

## 2015-05-18 NOTE — Progress Notes (Signed)
Pacemaker check in clinic. Normal device function. Thresholds, sensing, impedances consistent with previous measurements. Device programmed to maximize longevity. No mode switch or high ventricular rates noted. Device programmed at appropriate safety margins. Histogram distribution appropriate for patient activity level. Device programmed to optimize intrinsic conduction. Batt voltage 2.9 (ERI 2.81V). Patient will follow up with the Device Clinic in 3months and with JA in 6 months.

## 2015-05-18 NOTE — Progress Notes (Signed)
CARDIOLOGY OFFICE NOTE  Date:  05/18/2015    [name] Date of Birth: July 28, 1930 Medical Record #[id number]  PCP:  Maryelizabeth Rowan, MD  Cardiologist:  Allred/Jordan    Chief Complaint  Patient presents with  . Atrial Fibrillation    Follow up visit - seen for Dr. Johney Frame    History of Present Illness: [name] is a 81 y.o. female who presents today for a follow up visit. She is seen for Dr. Johney Frame & [place].   She has CAD with CABG x 2 with MV repair and MAZE in 2009, AF - maintained on Tikosyn, chronic systolic HF with EF of 35% per echo from May of 2014, tachybrady syndrome with PPM in 2009, depression, HLD and HTN.   In the hospital back in November of 2014 with worsening dyspnea and chest pressure - found to be profoundly anemia and having an acute systolic HF event. Anemia was felt to be the driving force for her heart failure a that time. Apparently very depressed and lonely - chronic issue - was quite vocal about wanting to die. Was not felt to be suicidal. Her coumadin was stopped. She has refused GI evaluation.   Admitted in early July 2015 with acute systolic heart failure - had had too much salt - still very clear that she did not wish to improve any aspect of her care and was ready to die. Palliative services discussed. Was in sinus rhythm. Refused anticoagulation despite recommendation.   I have not seen her in a year - she was quite clear at that visit that she wanted a conservative approach. She has refused to establish back with primary care.   Last saw Dr. Johney Frame back in February. No real change.   Comes in today. Here with her son. She says she is doing ok. Pretty nonchalant about everything. No chest pain. Not dizzy. Had a fall about a month ago - scraped her arm but otherwise was ok. She is not taking Zetia - and does not plan on taking.  Still likes to eat out and most likely gets too much salt. Her weight is stable. No real swelling.   Past  Medical History  Diagnosis Date  . Coronary artery disease     a. 1996 s/p PS BMS;  b. 03/2007 NSTEMI/PCI: 3.0x24 and 3.0x28 Taxus DES' t RCA; c. 05/2008 CABG x 2 with MV repair and Maze (LIMA->LAD, VG->PDA)  . Atrial fibrillation     a. in sinus on Tikosyn and coumadin;  b. s/p MAZE @ time of MVR/CABG 05/2008;  c. h/o pulm toxicity while on amio.  . Chronic systolic CHF (congestive heart failure)     a. 02/2013 Echo: EF 35% inflat AK, mod to sev calcified MV annulus w/o MS, mod dil LA, PASP .  Marland Kitchen Hypertension   . Anxiety   . Depression   . Hyperlipidemia   . Mitral insufficiency     a. 05/2008 s/p MV repair with [employer] MV ring (model 5200, 26mm ser # [id number].  Marland Kitchen IDA (iron deficiency anemia)   . Subarachnoid hemorrhage     a. in setting of syncope/fall/digoxin toxicity 11/2007.  Marland Kitchen LBBB (left bundle branch block)   . Chronic anticoagulation     coumadin  . High risk medication use     Tikosyn  . Tachycardia-bradycardia     a. 05/2008 s/p MDT P1501DR Enrhythm DC PPM, ser # [id number].    Past Surgical History  Procedure Laterality Date  .  Cardiac catheterization  03/19/2007    EF 45%. THERE IS 2+ MITRAL INSUFFICIENCY. THERE IS MODERATE INFERIOR WALL HYPOKINESIA WITH OVERALL MILD TO MODERATE LV DYSFUNCTION  . Coronary stent placement  1996, AND 03/2007    RIGHT CORONARY  . Removal of colon polyp      AND A LARGE CECAL POLYP  . Coronary artery bypass graft  04/2008    INCLUDING A MITRAL VALVE REPAIR AND A LEFT SIDED MAZE PROCEDURE  . Coronary artery bypass graft      X2 with LIMA to LAD and SVG to PD and WITH A LEFT SIDED MAZE PROCEDURE & MITRAL VALVE REPAIR. THIS INCLUDED AN LIMA GRAFT TO THE LAD, AND A SAPHENOUS VEIN GRAFT TO THE PDA.  Marland Kitchen Transthoracic echocardiogram  06/21/2008    EF 35%  . Pacemaker insertion  05/19/2008    MDT EnRhythm implanted for mobitz II AV block by Dr Reyes Ivan  . Mitral valve repair       Medications: Current Outpatient Prescriptions    Medication Sig Dispense Refill  . Cholecalciferol (VITAMIN D) 2000 UNITS tablet Take 2,000 Units by mouth daily.      Marland Kitchen dofetilide (TIKOSYN) 250 MCG capsule TAKE 1 CAPSULE (250 MCG TOTAL) BY MOUTH 2 (TWO) TIMES DAILY. 180 capsule 1  . furosemide (LASIX) 20 MG tablet Take 1 tablet (20 mg total) by mouth daily. 90 tablet 3  . lisinopril (PRINIVIL,ZESTRIL) 2.5 MG tablet Take 1 tablet (2.5 mg total) by mouth daily. 90 tablet 3  . metoprolol tartrate (LOPRESSOR) 25 MG tablet Take 0.5 tablets (12.5 mg total) by mouth 2 (two) times daily. 90 tablet 3  . OVER THE COUNTER MEDICATION Place 1 drop into both eyes daily as needed (for itchy eyes). Eye Drops    . pantoprazole (PROTONIX) 40 MG tablet TAKE 1 TABLET (40 MG TOTAL) BY MOUTH DAILY. 90 tablet 3   No current facility-administered medications for this visit.    Allergies: Allergies  Allergen Reactions  . Amiodarone     unknown  . Atorvastatin     Myalgia  . Diphenhydramine Hcl Other (See Comments)    unknown  . Rosuvastatin     myalgia    Social History: The patient  reports that she has been smoking.  She has never used smokeless tobacco. She reports that she does not drink alcohol or use illicit drugs.   Family History: The patient's family history includes Cancer in her father; Heart attack in her mother.   Review of Systems: Please see the history of present illness.   Otherwise, the review of systems is positive for none.   All other systems are reviewed and negative.   Physical Exam: VS:  BP 100/60 mmHg  Pulse 69  Ht 5\' 5"  (1.651 m)  Wt 169 lb 12.8 oz (77.021 kg)  BMI 28.26 kg/m2  SpO2 91% .  BMI Body mass index is 28.26 kg/(m^2).  Wt Readings from Last 3 Encounters:  05/18/15 169 lb 12.8 oz (77.021 kg)  11/15/14 173 lb 12.8 oz (78.835 kg)  05/25/14 164 lb 1.9 oz (74.444 kg)    General: She is alert - she is in no acute distress.  HEENT: Normal. Neck: Supple, no JVD, carotid bruits, or masses noted.  Cardiac:  Regular rate and rhythm. No murmurs, rubs, or gallops. No edema.  Respiratory:  Lungs are clear to auscultation bilaterally with normal work of breathing.  GI: Soft and nontender.  MS: No deformity or atrophy. Gait and ROM intact. Skin: Warm  and dry. Color is normal.  Neuro:  Strength and sensation are intact and no gross focal deficits noted.  Psych: Alert, appropriate and with normal affect.   LABORATORY DATA:  EKG:  EKG is not ordered today.   Lab Results  Component Value Date   WBC 9.1 11/15/2014   HGB 14.4 11/15/2014   HCT 43.3 11/15/2014   PLT 245.0 11/15/2014   GLUCOSE 110* 11/15/2014   CHOL 204* 05/07/2012   TRIG 196.0* 05/07/2012   HDL 40.50 05/07/2012   LDLDIRECT 136.5 05/07/2012   ALT 11 11/23/2013   AST 18 11/23/2013   NA 139 11/15/2014   K 3.8 11/15/2014   CL 105 11/15/2014   CREATININE 0.93 11/15/2014   BUN 16 11/15/2014   CO2 26 11/15/2014   TSH 0.784 04/08/2014   INR 2.14* 08/10/2013   HGBA1C 5.7* 02/21/2013    BNP (last 3 results) No results for input(s): BNP in the last 8760 hours.  ProBNP (last 3 results)  Recent Labs  05/25/14 1409  PROBNP 119.0*     Other Studies Reviewed Today:  Echo Study Conclusions from May 2014  - Left ventricle: There is calcification, thinning, and akinesis of the inferolateral wall. The cavity size was normal. Wall thickness was increased in a pattern of mild LVH. The estimated ejection fraction was 35%. - Aortic valve: Sclerosis without stenosis. No significant regurgitation. - Mitral valve: Moderately to severely calcified annulus. But no significant mitral stenosis. - Left atrium: The atrium was moderately dilated. - Right ventricle: Poorly visualized. The cavity size was normal. Systolic function was normal. - Pulmonary arteries: PA peak pressure: 33mm Hg (S).   Assessment / Plan:  1. Ischemic CM with EF of 35% - seems compensated. She has opted for a very conservative approach.   2. CAD - no  active symptoms.   3. Depression - doesn't seem as bad today compared to prior visits.  4. PAF - on Tikosyn - remains in sinus. Refuses anticoagulation. Will get her labs checked today.   5. Anemia  6. PPM - checking today.   She remains quite clear in her preference for conservative approach.  Current medicines are reviewed with the patient today.  The patient does not have concerns regarding medicines other than what has been noted above.  The following changes have been made:  See above.  Labs/ tests ordered today include:    Orders Placed This Encounter  Procedures  . Basic metabolic panel  . CBC  . Hepatic function panel  . Lipid panel  . Magnesium     Disposition:   FU with Dr. Johney Frame in 6 months.   Patient is agreeable to this plan and will call if any problems develop in the interim.   Signed: [name], RN, ANP-C 05/18/2015 2:56 PM  [facility] [address] [place], [place]  [address] Phone: [phone] Fax: [phone]

## 2015-05-26 ENCOUNTER — Encounter: Payer: Self-pay | Admitting: Internal Medicine

## 2015-07-25 ENCOUNTER — Other Ambulatory Visit: Payer: Self-pay | Admitting: Internal Medicine

## 2015-08-08 ENCOUNTER — Other Ambulatory Visit: Payer: Self-pay | Admitting: Internal Medicine

## 2015-08-10 ENCOUNTER — Other Ambulatory Visit: Payer: Self-pay | Admitting: Internal Medicine

## 2015-08-10 ENCOUNTER — Other Ambulatory Visit: Payer: Self-pay

## 2015-08-10 MED ORDER — DOFETILIDE 250 MCG PO CAPS: ORAL_CAPSULE | ORAL | Status: DC

## 2015-10-13 ENCOUNTER — Telehealth: Payer: Self-pay

## 2015-10-13 NOTE — Telephone Encounter (Signed)
Prior auth for Tikosyn 250mcg [facility]

## 2015-10-14 ENCOUNTER — Telehealth: Payer: Self-pay

## 2015-10-14 NOTE — Telephone Encounter (Signed)
Tikosyn approved by [facility] through 11/12/2016.

## 2015-11-01 ENCOUNTER — Emergency Department (INDEPENDENT_AMBULATORY_CARE_PROVIDER_SITE_OTHER)
Admission: EM | Admit: 2015-11-01 | Discharge: 2015-11-01 | Disposition: A | Discharge status: Home / Self Care | Payer: Medicare Other | Source: Home / Self Care | Attending: Emergency Medicine | Admitting: Emergency Medicine

## 2015-11-01 ENCOUNTER — Encounter (HOSPITAL_COMMUNITY): Payer: Self-pay | Admitting: Emergency Medicine

## 2015-11-01 DIAGNOSIS — H109 Unspecified conjunctivitis: Secondary | ICD-10-CM

## 2015-11-01 DIAGNOSIS — I959 Hypotension, unspecified: Secondary | ICD-10-CM

## 2015-11-01 DIAGNOSIS — H1089 Other conjunctivitis: Secondary | ICD-10-CM

## 2015-11-01 DIAGNOSIS — A499 Bacterial infection, unspecified: Secondary | ICD-10-CM | POA: Diagnosis not present

## 2015-11-01 MED ORDER — TOBRAMYCIN 0.3 % OP OINT: 1.0000 "application " | TOPICAL_OINTMENT | Freq: Three times a day (TID) | OPHTHALMIC | Status: DC

## 2015-11-01 NOTE — Discharge Instructions (Signed)
How to Use Eye Drops and Eye Ointments HOW TO APPLY EYE DROPS Follow these steps when applying eye drops:  Wash your hands.  Tilt your head back.  Put a finger under your eye and use it to gently pull your lower lid downward. Keep that finger in place.  Using your other hand, hold the dropper between your thumb and index finger.  Position the dropper just over the edge of the lower lid. Hold it as close to your eye as you can without touching the dropper to your eye.  Steady your hand. One way to do this is to lean your index finger against your brow.  Look up.  Slowly and gently squeeze one drop of medicine into your eye.  Close your eye.  Place a finger between your lower eyelid and your nose. Press gently for 2 minutes. This increases the amount of time that the medicine is exposed to the eye. It also reduces side effects that can develop if the drop gets into the bloodstream through the nose. HOW TO APPLY EYE OINTMENTS Follow these steps when applying eye ointments:  Wash your hands.  Put a finger under your eye and use it to gently pull your lower lid downward. Keep that finger in place.  Using your other hand, place the tip of the tube between your thumb and index finger with the remaining fingers braced against your cheek or nose.  Hold the tube just over the edge of your lower lid without touching the tube to your lid or eyeball.  Look up.  Line the inner part of your lower lid with ointment.  Gently pull up on your upper lid and look down. This will force the ointment to spread over the surface of the eye.  Release the upper lid.  If you can, close your eyes for 1-2 minutes. Do not rub your eyes. If you applied the ointment correctly, your vision will be blurry for a few minutes. This is normal. ADDITIONAL INFORMATION  Make sure to use the eye drops or ointment as told by your health care provider.  If you have been told to use both eye drops and an  eye ointment, apply the eye drops first, then wait 3-4 minutes before you apply the ointment.  Try not to touch the tip of the dropper or tube to your eye. A dropper or tube that has touched the eye can become contaminated.   This information is not intended to replace advice given to you by your health care provider. Make sure you discuss any questions you have with your health care provider.   Document Released: 12/31/2000 Document Revised: 02/08/2015 Document Reviewed: 09/20/2014 Elsevier Interactive Patient Education 2016 [employer]  Hypotension Call your doctor tomorrow regarding your blood pressure. Ask him if you should be taking your blood pressure medicine tomorrow. For now do not take your blood pressure medicine until you have discussed it with him. As your heart beats, it forces blood through your body. This force is called blood pressure. If you have hypotension, you have low blood pressure. When your blood pressure is too low, you may not get enough blood to your brain. You may feel weak, feel lightheaded, have a fast heartbeat, or even pass out (faint). HOME CARE  Drink enough fluids to keep your pee (urine) clear or pale yellow.  Take all medicines as told by your doctor.  Get up slowly after sitting or lying down.  Wear support stockings  as told by your doctor.  Maintain a healthy diet by including foods such as fruits, vegetables, nuts, whole grains, and lean meats. GET HELP IF:  You are throwing up (vomiting) or have watery poop (diarrhea).  You have a fever for more than 2-3 days.  You feel more thirsty than usual.  You feel weak and tired. GET HELP RIGHT AWAY IF:   You pass out (faint).  You have chest pain or a fast or irregular heartbeat.  You lose feeling in part of your body.  You cannot move your arms or legs.  You have trouble speaking.  You get sweaty or feel lightheaded. MAKE SURE YOU:   Understand these instructions.  Will watch your  condition.  Will get help right away if you are not doing well or get worse.   This information is not intended to replace advice given to you by your health care provider. Make sure you discuss any questions you have with your health care provider.   Document Released: 12/19/2009 Document Revised: 05/27/2013 Document Reviewed: 03/27/2013 Elsevier Interactive Patient Education [employer]

## 2015-11-01 NOTE — ED Notes (Signed)
Blotchy, red area to right side of face, around eye.  onset 3 days ago.  Redness follows right to center of face: forehead, nose, and scalp.  Skin is puffy.  Eye is tearing.  Has used ice pack

## 2015-11-01 NOTE — ED Provider Notes (Signed)
CSN: [id number]     Arrival date & time 11/01/15  1727 History   First MD Initiated Contact with Patient 11/01/15 1914     Chief Complaint  Patient presents with  . Eye Problem   (Consider location/radiation/quality/duration/timing/severity/associated sxs/prior Treatment) HPI Comments: 82 year old female complaining of redness of the right eye. This started approximately 2 days ago. Her daughter offers most of the history. The patient denies any history of trauma. Denies problems with vision changes. The patient denies drainage however it is obvious that there is some purulent type drainage from the eye.  The daughter also questioned the blood pressure readings. The patient is on 3 medications second lower the blood pressure. The patient was advised that she contact her physician tomorrow regarding her blood pressure and to whether she should be taking chest some are all of her medications or to withhold him. She is awake alert. She denies feeling dizzy or presyncopal symptoms.   Past Medical History  Diagnosis Date  . Coronary artery disease     a. 1996 s/p PS BMS;  b. 03/2007 NSTEMI/PCI: 3.0x24 and 3.0x28 Taxus DES' t RCA; c. 05/2008 CABG x 2 with MV repair and Maze (LIMA->LAD, VG->PDA)  . Atrial fibrillation (HCC)     a. in sinus on Tikosyn and coumadin;  b. s/p MAZE @ time of MVR/CABG 05/2008;  c. h/o pulm toxicity while on amio.  . Chronic systolic CHF (congestive heart failure) (HCC)     a. 02/2013 Echo: EF 35% inflat AK, mod to sev calcified MV annulus w/o MS, mod dil LA, PASP .  Marland Kitchen Hypertension   . Anxiety   . Depression   . Hyperlipidemia   . Mitral insufficiency     a. 05/2008 s/p MV repair with [employer] MV ring (model 5200, 26mm ser # [id number].  Marland Kitchen IDA (iron deficiency anemia)   . Subarachnoid hemorrhage (HCC)     a. in setting of syncope/fall/digoxin toxicity 11/2007.  Marland Kitchen LBBB (left bundle branch block)   . Chronic anticoagulation     coumadin  . High risk  medication use     Tikosyn  . Tachycardia-bradycardia [facility])     a. 05/2008 s/p MDT P1501DR Enrhythm DC PPM, ser # [id number].   Past Surgical History  Procedure Laterality Date  . Cardiac catheterization  03/19/2007    EF 45%. THERE IS 2+ MITRAL INSUFFICIENCY. THERE IS MODERATE INFERIOR WALL HYPOKINESIA WITH OVERALL MILD TO MODERATE LV DYSFUNCTION  . Coronary stent placement  1996, AND 03/2007    RIGHT CORONARY  . Removal of colon polyp      AND A LARGE CECAL POLYP  . Coronary artery bypass graft  04/2008    INCLUDING A MITRAL VALVE REPAIR AND A LEFT SIDED MAZE PROCEDURE  . Coronary artery bypass graft      X2 with LIMA to LAD and SVG to PD and WITH A LEFT SIDED MAZE PROCEDURE & MITRAL VALVE REPAIR. THIS INCLUDED AN LIMA GRAFT TO THE LAD, AND A SAPHENOUS VEIN GRAFT TO THE PDA.  Marland Kitchen Transthoracic echocardiogram  06/21/2008    EF 35%  . Pacemaker insertion  05/19/2008    MDT EnRhythm implanted for mobitz II AV block by Dr Reyes Ivan  . Mitral valve repair     Family History  Problem Relation Age of Onset  . Heart attack Mother   . Cancer Father    Social History  Substance Use Topics  . Smoking status: Current Some Day Smoker    Last Attempt  to Quit: 05/06/1996  . Smokeless tobacco: Never Used  . Alcohol Use: No   OB History    No data available     Review of Systems  Constitutional: Negative.   HENT: Negative.   Eyes: Positive for redness. Negative for photophobia, pain, discharge and visual disturbance.  Respiratory: Negative.   Cardiovascular: Negative.   Skin: Negative.   Neurological: Negative.     Allergies  Amiodarone; Atorvastatin; Diphenhydramine hcl; and Rosuvastatin  Home Medications   Prior to Admission medications   Medication Sig Start Date End Date Taking? Authorizing Provider  Cholecalciferol (VITAMIN D) 2000 UNITS tablet Take 2,000 Units by mouth daily.      Historical Provider, MD  dofetilide (TIKOSYN) 250 MCG capsule TAKE 1 CAPSULE (250 MCG TOTAL) BY  MOUTH 2 (TWO) TIMES DAILY. 08/10/15   Hillis Range, MD  dofetilide (TIKOSYN) 250 MCG capsule TAKE 1 CAPSULE BY MOUTH 2 TIMES DAILY. 08/11/15   Hillis Range, MD  furosemide (LASIX) 20 MG tablet Take 1 tablet (20 mg total) by mouth daily. 05/18/15   [name], NP  lisinopril (PRINIVIL,ZESTRIL) 2.5 MG tablet Take 1 tablet (2.5 mg total) by mouth daily. 05/18/15   [name], NP  metoprolol tartrate (LOPRESSOR) 25 MG tablet Take 0.5 tablets (12.5 mg total) by mouth 2 (two) times daily. 05/18/15   [name], NP  OVER THE COUNTER MEDICATION Place 1 drop into both eyes daily as needed (for itchy eyes). Eye Drops    Historical Provider, MD  pantoprazole (PROTONIX) 40 MG tablet TAKE 1 TABLET (40 MG TOTAL) BY MOUTH DAILY. 11/29/14   Hillis Range, MD  tobramycin (TOBREX) 0.3 % ophthalmic ointment Place 1 application into the right eye 3 (three) times daily. 11/01/15   Hayden Rasmussen, NP   Meds Ordered and Administered this Visit  Medications - No data to display  BP 90/60 mmHg  Pulse 82  Temp(Src) 97.9 F (36.6 C) (Oral)  Resp 20  SpO2 94% No data found.   Physical Exam  Constitutional: She is oriented to person, place, and time. She appears well-developed and well-nourished. No distress.  Eyes: EOM are normal. Pupils are equal, round, and reactive to light.  There is mild erythema and swelling of the upper and lower lids and the conjunctiva. The sclera is injected. There is a honey colored exudate exuding from the eye. No periorbital tenderness.  Neck: Normal range of motion. Neck supple.  Cardiovascular: Normal rate.   Pulmonary/Chest: Effort normal. No respiratory distress.  Neurological: She is alert and oriented to person, place, and time. She exhibits normal muscle tone.  Skin: Skin is warm and dry.  Psychiatric: She has a normal mood and affect.  Nursing note and vitals reviewed.   ED Course  Procedures (including critical care time)  Labs Review Labs Reviewed - No data to  display  Imaging Review No results found.   Visual Acuity Review  Right Eye Distance:   Left Eye Distance:   Bilateral Distance:    Right Eye Near:   Left Eye Near:    Bilateral Near:         MDM   1. Bacterial conjunctivitis of right eye   2. Hypotension, unspecified hypotension type    Meds ordered this encounter  Medications  . tobramycin (TOBREX) 0.3 % ophthalmic ointment    Sig: Place 1 application into the right eye 3 (three) times daily.    Dispense:  3.5 g    Refill:  0  Order Specific Question:  Supervising Provider    Answer:  Charm Rings [[id number]]   She was instructed to call the ophthalmologist on call listed on page one if there is no improvement in the following 2 days. For any worsening such as loss or problems with vision, increased redness or pain C the ophthalmologist earlier. Call your PCP or cardiologist tomorrow regarding your low blood pressure. Do not take your blood pressure medicine tomorrow until after you have called your physician. The patient and her daughter states they understand.    Hayden Rasmussen, NP 11/01/15 2018

## 2015-11-02 ENCOUNTER — Telehealth: Payer: Self-pay | Admitting: Internal Medicine

## 2015-11-02 NOTE — Telephone Encounter (Signed)
Pt c/o BP issue: STAT if pt c/o blurred vision, one-sided weakness or slurred speech  1. What are your last 5 BP readings? 86/58  2. Are you having any other symptoms (ex. Dizziness, headache, blurred vision, passed out)? Vision b/c of eye infection/weak  3. What is your BP issue? Low  Pt's son calling and stated pt went to Urgent Care yesterday and her bp was low. He also stated pt has an eye infection that is causing her eye to be blurry.Pt's son want pt to come into office today to see Dr Johney Frame.

## 2015-11-02 NOTE — Telephone Encounter (Signed)
Discussed with Norma Fredrickson, NP.  Patient to stop Lisinopril and follow up in 2 weeks.  She currently has her yearly PPM check with Dr Johney Frame on 11/17/15.  Spoke with the son and will stop Lisinopril and keep the scheduled appointment with Dr Johney Frame on 11/17/15

## 2015-11-10 ENCOUNTER — Telehealth (HOSPITAL_COMMUNITY): Payer: Self-pay | Admitting: Emergency Medicine

## 2015-11-10 NOTE — ED Notes (Signed)
Rec'd a call from daughter-in-law in re to pt and her visit on 1/24.  Reviewed chart w/Dr. Piedad Climes... Dr. Piedad Climes will call and f/u

## 2015-11-17 ENCOUNTER — Encounter: Payer: Self-pay | Admitting: Internal Medicine

## 2015-11-17 ENCOUNTER — Ambulatory Visit (INDEPENDENT_AMBULATORY_CARE_PROVIDER_SITE_OTHER): Payer: Medicare Other | Admitting: Internal Medicine

## 2015-11-17 VITALS — BP 114/66 | HR 80 | Ht 65.0 in | Wt 161.4 lb

## 2015-11-17 DIAGNOSIS — I480 Paroxysmal atrial fibrillation: Secondary | ICD-10-CM

## 2015-11-17 DIAGNOSIS — I495 Sick sinus syndrome: Secondary | ICD-10-CM

## 2015-11-17 LAB — BASIC METABOLIC PANEL
BUN: 12 mg/dL (ref 7–25)
CHLORIDE: 103 mmol/L (ref 98–110)
CO2: 26 mmol/L (ref 20–31)
CREATININE: 0.97 mg/dL — AB (ref 0.60–0.88)
Calcium: 9.3 mg/dL (ref 8.6–10.4)
GLUCOSE: 138 mg/dL — AB (ref 65–99)
Potassium: 3.2 mmol/L — ABNORMAL LOW (ref 3.5–5.3)
Sodium: 139 mmol/L (ref 135–146)

## 2015-11-17 LAB — CUP PACEART INCLINIC DEVICE CHECK
Battery Voltage: 2.89 V
Brady Statistic AP VP Percent: 0.01 %
Brady Statistic AS VS Percent: 94.65 %
Brady Statistic RA Percent Paced: 5.32 %
Brady Statistic RV Percent Paced: 0.03 %
Date Time Interrogation Session: 20170209165054
Implantable Lead Implant Date: 20090812
Implantable Lead Location: 753859
Implantable Lead Location: 753860
Lead Channel Sensing Intrinsic Amplitude: 1.056 mV
Lead Channel Setting Pacing Amplitude: 4 V
Lead Channel Setting Pacing Pulse Width: 0.4 ms
MDC IDC LEAD IMPLANT DT: 20090812
MDC IDC MSMT LEADCHNL RA IMPEDANCE VALUE: 376 Ohm
MDC IDC MSMT LEADCHNL RV IMPEDANCE VALUE: 576 Ohm
MDC IDC MSMT LEADCHNL RV SENSING INTR AMPL: 20.266 mV
MDC IDC SET LEADCHNL RV PACING AMPLITUDE: 2 V
MDC IDC SET LEADCHNL RV SENSING SENSITIVITY: 0.9 mV
MDC IDC STAT BRADY AP VS PERCENT: 5.32 %
MDC IDC STAT BRADY AS VP PERCENT: 0.02 %

## 2015-11-17 LAB — MAGNESIUM: Magnesium: 2.1 mg/dL (ref 1.5–2.5)

## 2015-11-17 NOTE — Patient Instructions (Signed)
Medication Instructions:  Your physician recommends that you continue on your current medications as directed. Please refer to the Current Medication list given to you today.   Labwork: Your physician recommends that you return for lab work in: TODaY (bmet, mg)   Testing/Procedures: none  Follow-Up: Remote monitoring is used to monitor your Pacemaker or ICD from home. This monitoring reduces the number of office visits required to check your device to one time per year. It allows [place] to keep an eye on the functioning of your device to ensure it is working properly. You are scheduled for a device check from home on 12/15/2015. You may send your transmission at any time that day. If you have a wireless device, the transmission will be sent automatically. After your physician reviews your transmission, you will receive a postcard with your next transmission date.   Your physician wants you to follow-up in: 6 months with Lawson Fiscal, NP You will receive a reminder letter in the mail two months in advance. If you don't receive a letter, please call our office to schedule the follow-up appointment.  Your physician wants you to follow-up in: 12 months with Dr. Johney Frame. You will receive a reminder letter in the mail two months in advance. If you don't receive a letter, please call our office to schedule the follow-up appointment.   Any Other Special Instructions Will Be Listed Below (If Applicable).     If you need a refill on your cardiac medications before your next appointment, please call your pharmacy.

## 2015-11-17 NOTE — Progress Notes (Signed)
PCP: Maryelizabeth Rowan, MD Primary Cardiologist:  Dr [place]  The patient presents today for routine electrophysiology followup.  She has a h/o atrial fibrillation and bradycardia s/p PPM implantation (MDT) implantation 05/19/2008 by Dr Reyes Ivan.  She previously had anemia for which she declined workup.  She has declined anticoagulation with her afib.  She has maintained sinus rhythm with tikosyn for several years.  She is recently recovering from Shingles.  Today, she denies symptoms of palpitations, chest pain, shortness of breath, orthopnea, PND, lower extremity edema, dizziness, presyncope, syncope, or neurologic sequela. The patient is tolerating medications without difficulties and is otherwise without complaint today.    Past Medical History  Diagnosis Date  . Coronary artery disease     a. 1996 s/p PS BMS;  b. 03/2007 NSTEMI/PCI: 3.0x24 and 3.0x28 Taxus DES' t RCA; c. 05/2008 CABG x 2 with MV repair and Maze (LIMA->LAD, VG->PDA)  . Atrial fibrillation (HCC)     a. in sinus on Tikosyn and coumadin;  b. s/p MAZE @ time of MVR/CABG 05/2008;  c. h/o pulm toxicity while on amio.  . Chronic systolic CHF (congestive heart failure) (HCC)     a. 02/2013 Echo: EF 35% inflat AK, mod to sev calcified MV annulus w/o MS, mod dil LA, PASP .  Marland Kitchen Hypertension   . Anxiety   . Depression   . Hyperlipidemia   . Mitral insufficiency     a. 05/2008 s/p MV repair with [employer] MV ring (model 5200, 26mm ser # [id number].  Marland Kitchen IDA (iron deficiency anemia)   . Subarachnoid hemorrhage (HCC)     a. in setting of syncope/fall/digoxin toxicity 11/2007.  Marland Kitchen LBBB (left bundle branch block)   . Chronic anticoagulation     coumadin  . High risk medication use     Tikosyn  . Tachycardia-bradycardia [facility])     a. 05/2008 s/p MDT P1501DR Enrhythm DC PPM, ser # [id number].   Past Surgical History  Procedure Laterality Date  . Cardiac catheterization  03/19/2007    EF 45%. THERE IS 2+ MITRAL INSUFFICIENCY. THERE IS  MODERATE INFERIOR WALL HYPOKINESIA WITH OVERALL MILD TO MODERATE LV DYSFUNCTION  . Coronary stent placement  1996, AND 03/2007    RIGHT CORONARY  . Removal of colon polyp      AND A LARGE CECAL POLYP  . Coronary artery bypass graft  04/2008    INCLUDING A MITRAL VALVE REPAIR AND A LEFT SIDED MAZE PROCEDURE  . Coronary artery bypass graft      X2 with LIMA to LAD and SVG to PD and WITH A LEFT SIDED MAZE PROCEDURE & MITRAL VALVE REPAIR. THIS INCLUDED AN LIMA GRAFT TO THE LAD, AND A SAPHENOUS VEIN GRAFT TO THE PDA.  Marland Kitchen Transthoracic echocardiogram  06/21/2008    EF 35%  . Pacemaker insertion  05/19/2008    MDT EnRhythm implanted for mobitz II AV block by Dr Reyes Ivan  . Mitral valve repair      Current Outpatient Prescriptions  Medication Sig Dispense Refill  . Cholecalciferol (VITAMIN D) 2000 UNITS tablet Take 2,000 Units by mouth daily.      Marland Kitchen dofetilide (TIKOSYN) 250 MCG capsule TAKE 1 CAPSULE (250 MCG TOTAL) BY MOUTH 2 (TWO) TIMES DAILY. 180 capsule 3  . furosemide (LASIX) 20 MG tablet Take 1 tablet (20 mg total) by mouth daily. 90 tablet 3  . metoprolol tartrate (LOPRESSOR) 25 MG tablet Take 0.5 tablets (12.5 mg total) by mouth 2 (two) times daily. 90 tablet 3  .  OVER THE COUNTER MEDICATION Place 1 drop into both eyes daily as needed (for itchy eyes). Eye Drops    . pantoprazole (PROTONIX) 40 MG tablet TAKE 1 TABLET (40 MG TOTAL) BY MOUTH DAILY. 90 tablet 3  . prednisoLONE acetate (PRED FORTE) 1 % ophthalmic suspension Place 1 drop into the right eye 4 (four) times daily.  0  . tobramycin (TOBREX) 0.3 % ophthalmic ointment Place 1 application into the right eye 3 (three) times daily. 3.5 g 0  . valACYclovir (VALTREX) 1000 MG tablet Take 1,000 mg by mouth 3 (three) times daily.  0   No current facility-administered medications for this visit.    Allergies  Allergen Reactions  . Amoxicillin Other (See Comments)    REACTION: "made flush...cheeks red"  . Amiodarone     unknown  .  Atorvastatin     Myalgia  . Diphenhydramine Hcl Other (See Comments)    unknown  . Rosuvastatin     myalgia    Social History   Social History  . Marital Status: Married    Spouse Name: N/A  . Number of Children: N/A  . Years of Education: N/A   Occupational History  . Not on file.   Social History Main Topics  . Smoking status: Current Some Day Smoker    Last Attempt to Quit: 05/06/1996  . Smokeless tobacco: Never Used  . Alcohol Use: No  . Drug Use: No  . Sexual Activity: No   Other Topics Concern  . Not on file   Social History Narrative    Family History  Problem Relation Age of Onset  . Heart attack Mother   . Cancer Father    ROS-all systems are reviewed and negative except as per HPI above  Physical Exam: Filed Vitals:   11/17/15 1104  BP: 114/66  Pulse: 80  Height:  (1.651 m)  Weight: 161 lb 6.4 oz (73.211 kg)    GEN- The patient is well appearing, alert and oriented x 3 today.   Head- shingles healing over R forehead Eyes-  Sclera clear, conjunctiva pink Ears- hearing intact Oropharynx- clear Neck- supple Lungs- Clear to ausculation bilaterally, normal work of breathing Chest- pacemaker pocket is well healed Heart- Regular rate and rhythm, no murmurs, rubs or gallops, PMI not laterally displaced GI- soft, NT, ND, + BS Extremities- no clubbing, cyanosis, or edema  Pacemaker interrogation- reviewed in detail today,  See PACEART report ekg today reveals sinus rhythm with LBBB, Qtc 498 msec  Assessment and Plan:  1. Sick sinus syndrome Normal pacemaker function See Pace Art report No changes today Atrial output is chronically elevated however she rarely paces.  Given advanced age, would avoid lead revision. She says that she will begin carelink remotes.  I would like to perform monthly for battery longevity per [employer]ana Half) recommendation  2. afib Well controlled Qt is stable Poor candidate for anticoagulation long  term Bmet, mg today and again in 6 months on return to see Norma Fredrickson  3. CAD Stable No change required today  4. HTN Stable No change required today  5. Chronic systolic dysfunction Appears euvolemic No changes today Further medicine therapy is limited by blood pressure Would not advise LV lead upgrade given advanced age and patient preferences for conservative management,  She is clear in her preference for conservative approach.  Return to see Lawson Fiscal in 6 months I will see in a year She is clear in her preference for conservative approach.

## 2015-11-17 NOTE — Addendum Note (Signed)
Addended by: Theressa Stamps on: 11/17/2015 11:46 AM   Modules accepted: Orders

## 2015-11-21 ENCOUNTER — Other Ambulatory Visit: Payer: Self-pay | Admitting: Pharmacist

## 2015-11-21 DIAGNOSIS — I480 Paroxysmal atrial fibrillation: Secondary | ICD-10-CM

## 2015-11-21 MED ORDER — POTASSIUM CHLORIDE CRYS ER 20 MEQ PO TBCR: 20.0000 meq | EXTENDED_RELEASE_TABLET | Freq: Two times a day (BID) | ORAL | Status: AC

## 2015-11-23 ENCOUNTER — Ambulatory Visit: Payer: Medicare Other | Admitting: Nurse Practitioner

## 2015-11-29 ENCOUNTER — Other Ambulatory Visit: Payer: Self-pay | Admitting: Internal Medicine

## 2015-11-30 ENCOUNTER — Other Ambulatory Visit: Payer: Medicare Other

## 2015-12-01 ENCOUNTER — Other Ambulatory Visit: Payer: Medicare Other

## 2015-12-05 ENCOUNTER — Other Ambulatory Visit: Payer: Medicare Other

## 2015-12-15 ENCOUNTER — Encounter: Payer: Medicare Other | Admitting: *Deleted

## 2015-12-16 ENCOUNTER — Encounter: Payer: Self-pay | Admitting: Cardiology

## 2016-01-03 DIAGNOSIS — R4189 Other symptoms and signs involving cognitive functions and awareness: Secondary | ICD-10-CM | POA: Insufficient documentation

## 2016-01-03 DIAGNOSIS — Z789 Other specified health status: Secondary | ICD-10-CM | POA: Insufficient documentation

## 2016-01-03 DIAGNOSIS — F17200 Nicotine dependence, unspecified, uncomplicated: Secondary | ICD-10-CM | POA: Insufficient documentation

## 2016-01-03 DIAGNOSIS — F339 Major depressive disorder, recurrent, unspecified: Secondary | ICD-10-CM | POA: Insufficient documentation

## 2016-01-03 DIAGNOSIS — B0230 Zoster ocular disease, unspecified: Secondary | ICD-10-CM | POA: Insufficient documentation

## 2016-05-16 ENCOUNTER — Other Ambulatory Visit: Payer: Self-pay | Admitting: *Deleted

## 2016-05-16 ENCOUNTER — Encounter (INDEPENDENT_AMBULATORY_CARE_PROVIDER_SITE_OTHER): Payer: Self-pay

## 2016-05-16 ENCOUNTER — Ambulatory Visit (INDEPENDENT_AMBULATORY_CARE_PROVIDER_SITE_OTHER): Payer: Medicare Other | Admitting: Nurse Practitioner

## 2016-05-16 ENCOUNTER — Encounter: Payer: Self-pay | Admitting: Nurse Practitioner

## 2016-05-16 VITALS — BP 160/70 | HR 66 | Ht 65.0 in | Wt 151.4 lb

## 2016-05-16 DIAGNOSIS — I480 Paroxysmal atrial fibrillation: Secondary | ICD-10-CM

## 2016-05-16 DIAGNOSIS — I5022 Chronic systolic (congestive) heart failure: Secondary | ICD-10-CM

## 2016-05-16 DIAGNOSIS — I10 Essential (primary) hypertension: Secondary | ICD-10-CM | POA: Diagnosis not present

## 2016-05-16 DIAGNOSIS — I495 Sick sinus syndrome: Secondary | ICD-10-CM

## 2016-05-16 DIAGNOSIS — Z950 Presence of cardiac pacemaker: Secondary | ICD-10-CM

## 2016-05-16 NOTE — Patient Instructions (Addendum)
We will be checking the following labs today - BMET and MG   Medication Instructions:    Continue with your current medicines.     Testing/Procedures To Be Arranged:  Let's do the remote pacemaker check monthly.  Follow-Up:   See Dr. Johney FrameAllred in 6 months.     Other Special Instructions:   N/A    If you need a refill on your cardiac medications before your next appointment, please call your pharmacy.   Call [facility] office at [phone] if you have any questions, problems or concerns.

## 2016-05-16 NOTE — Progress Notes (Signed)
CARDIOLOGY OFFICE NOTE  Date:  05/16/2016    [name] Date of Birth: Dec 05, 1929 Medical Record #[id number]  PCP:  Willow Ora, MD  Cardiologist:  Denzil Mceachron & [place] & Allred    Chief Complaint  Patient presents with  . Coronary Artery Disease  . Congestive Heart Failure  . Cardiomyopathy  . Atrial Fibrillation    6 month check - seen for Dr. Johney Frame & [place]    History of Present Illness: [name] is a 82 y.o. female who presents today for a 6 month check. She is seen for Dr. Johney Frame & [place].   She has CAD with CABG x 2 with MV repair and MAZE in 2009, AF - maintained on Tikosyn, chronic systolic HF with EF of 35% per echo from May of 2014, tachybrady syndrome with PPM in 2009, depression, HLD and HTN. She has a LBBB.   In the hospital back in November of 2014 with worsening dyspnea and chest pressure - found to be profoundly anemia and having an acute systolic HF event. Anemia was felt to be the driving force for her heart failure a that time. Apparently very depressed and lonely - chronic issue - was quite vocal about wanting to die. Was not felt to be suicidal. Her coumadin was stopped. She has refused GI evaluation. She has continued to refuse anticoagulation.   Admitted in early July 2015 with acute systolic heart failure - had had too much salt - still very clear that she did not wish to improve any aspect of her care and was ready to die. Palliative services discussed. Was in sinus rhythm. Refused anticoagulation despite recommendation.   Last saw Dr. Johney Frame back in February. No real change. She was coming down with the shingles.   Comes in today. Here with her daughter-in-law today. Woke up with a cough today. No chest pain. Not short of breath. She has obtained primary care with Dr. Mardelle Matte. Apparently got very sick with the shingles - she does not remember much of this. Again, wishes for nothing to really be done for her. She has lost almost 20 pounds  since I last saw her.    Past Medical History:  Diagnosis Date  . Anxiety   . Atrial fibrillation (HCC)    a. in sinus on Tikosyn and coumadin;  b. s/p MAZE @ time of MVR/CABG 05/2008;  c. h/o pulm toxicity while on amio.  . Chronic anticoagulation    coumadin  . Chronic systolic CHF (congestive heart failure) (HCC)    a. 02/2013 Echo: EF 35% inflat AK, mod to sev calcified MV annulus w/o MS, mod dil LA, PASP .  Marland Kitchen Coronary artery disease    a. 1996 s/p PS BMS;  b. 03/2007 NSTEMI/PCI: 3.0x24 and 3.0x28 Taxus DES' t RCA; c. 05/2008 CABG x 2 with MV repair and Maze (LIMA->LAD, VG->PDA)  . Depression   . High risk medication use    Tikosyn  . Hyperlipidemia   . Hypertension   . IDA (iron deficiency anemia)   . LBBB (left bundle branch block)   . Mitral insufficiency    a. 05/2008 s/p MV repair with [employer] MV ring (model 5200, 26mm ser # [id number].  . Subarachnoid hemorrhage (HCC)    a. in setting of syncope/fall/digoxin toxicity 11/2007.  Alcide Clever [facility])    a. 05/2008 s/p MDT P1501DR Enrhythm DC PPM, ser # [id number] H.    Past Surgical History:  Procedure Laterality Date  . CARDIAC CATHETERIZATION  03/19/2007   EF 45%. THERE IS 2+ MITRAL INSUFFICIENCY. THERE IS MODERATE INFERIOR WALL HYPOKINESIA WITH OVERALL MILD TO MODERATE LV DYSFUNCTION  . CORONARY ARTERY BYPASS GRAFT  04/2008   INCLUDING A MITRAL VALVE REPAIR AND A LEFT SIDED MAZE PROCEDURE  . CORONARY ARTERY BYPASS GRAFT     X2 with LIMA to LAD and SVG to PD and WITH A LEFT SIDED MAZE PROCEDURE & MITRAL VALVE REPAIR. THIS INCLUDED AN LIMA GRAFT TO THE LAD, AND A SAPHENOUS VEIN GRAFT TO THE PDA.  Marland Kitchen CORONARY STENT PLACEMENT  1996, AND 03/2007   RIGHT CORONARY  . MITRAL VALVE REPAIR    . PACEMAKER INSERTION  05/19/2008   MDT EnRhythm implanted for mobitz II AV block by Dr Reyes Ivan  . REMOVAL OF COLON POLYP     AND A LARGE CECAL POLYP  . TRANSTHORACIC ECHOCARDIOGRAM  06/21/2008   EF 35%      Medications: Current Outpatient Prescriptions  Medication Sig Dispense Refill  . carboxymethylcellulose (REFRESH PLUS) 0.5 % SOLN Place 1 drop into both eyes 3 (three) times daily as needed (d).    . Cholecalciferol (VITAMIN D) 2000 UNITS tablet Take 2,000 Units by mouth daily.      Marland Kitchen dofetilide (TIKOSYN) 250 MCG capsule TAKE 1 CAPSULE (250 MCG TOTAL) BY MOUTH 2 (TWO) TIMES DAILY. 180 capsule 3  . furosemide (LASIX) 20 MG tablet Take 1 tablet (20 mg total) by mouth daily. 90 tablet 3  . metoprolol tartrate (LOPRESSOR) 25 MG tablet Take 0.5 tablets (12.5 mg total) by mouth 2 (two) times daily. 90 tablet 3  . OVER THE COUNTER MEDICATION Place 1 drop into both eyes daily as needed (for dry eyes). Eye Drops    . pantoprazole (PROTONIX) 40 MG tablet TAKE 1 TABLET (40 MG TOTAL) BY MOUTH DAILY. 90 tablet 3  . potassium chloride SA (K-DUR,KLOR-CON) 20 MEQ tablet Take 1 tablet (20 mEq total) by mouth 2 (two) times daily. 60 tablet 11   No current facility-administered medications for this visit.     Allergies: Allergies  Allergen Reactions  . Amoxicillin Other (See Comments)    REACTION: "made flush...cheeks red"  . Amiodarone     unknown  . Atorvastatin     Myalgia  . Diphenhydramine Hcl Other (See Comments)    unknown  . Rosuvastatin     myalgia    Social History: The patient  reports that she has been smoking.  She has never used smokeless tobacco. She reports that she does not drink alcohol or use drugs.   Family History: The patient's family history includes Cancer in her father; Heart attack in her mother.   Review of Systems: Please see the history of present illness.   Otherwise, the review of systems is positive for none.   All other systems are reviewed and negative.   Physical Exam: VS:  BP (!) 160/70   Pulse 66   Ht  (1.651 m)   Wt 151 lb 6.4 oz (68.7 kg)   BMI 25.19 kg/m  .  BMI Body mass index is 25.19 kg/m.  Wt Readings from Last 3 Encounters:   05/16/16 151 lb 6.4 oz (68.7 kg)  11/17/15 161 lb 6.4 oz (73.2 kg)  05/18/15 169 lb 12.8 oz (77 kg)    General: Elderly female who is alert and in no acute distress.  Very nonchalant in her demeanor. She has lost 18 pounds since last visit with me.  HEENT: Normal.  Neck: Supple, no JVD,  carotid bruits, or masses noted.  Cardiac: Regular rate and rhythm. Harsh outflow murmur.  No edema.  Respiratory:  Lungs are clear to auscultation bilaterally with normal work of breathing.  GI: Soft and nontender.  MS: No deformity or atrophy. Gait and ROM intact.  Skin: Warm and dry. Color is normal.  Neuro:  Strength and sensation are intact and no gross focal deficits noted.  Psych: Alert, appropriate and with normal affect.   LABORATORY DATA:  EKG:  EKG is ordered today. This demonstrates NSR with LBBB. QTc is 499  Lab Results  Component Value Date   WBC 8.8 05/18/2015   HGB 13.9 05/18/2015   HCT 42.3 05/18/2015   PLT 267.0 05/18/2015   GLUCOSE 138 (H) 11/17/2015   CHOL 204 (H) 05/18/2015   TRIG 185.0 (H) 05/18/2015   HDL 44.20 05/18/2015   LDLDIRECT 136.5 05/07/2012   LDLCALC 123 (H) 05/18/2015   ALT 7 05/18/2015   AST 14 05/18/2015   NA 139 11/17/2015   K 3.2 (L) 11/17/2015   CL 103 11/17/2015   CREATININE 0.97 (H) 11/17/2015   BUN 12 11/17/2015   CO2 26 11/17/2015   TSH 0.784 04/08/2014   INR 2.14 (H) 08/10/2013   HGBA1C 5.7 (H) 02/21/2013    BNP (last 3 results) No results for input(s): BNP in the last 8760 hours.  ProBNP (last 3 results) No results for input(s): PROBNP in the last 8760 hours.   Other Studies Reviewed Today:   Assessment/Plan: 1. Ischemic CM with EF of 35% - seems compensated. She has opted for a very conservative approach. She is on very little medicine.  2. CAD - no active symptoms.   3. Depression - doesn't seem as bad today compared to prior visits.  4. PAF - on Tikosyn - remains in sinus. Refuses anticoagulation and she has been  deemed a poor candidate for anticoagulation. Will get her labs checked today.   5. Anemia  6. PPM -  Atrial output is chronically elevated however she rarely paces.  Given advanced age, would avoid lead revision. She is to be doing remote checks monthly. Does not look like this is being done.  Device is checked today. Family made aware of Dr. Jenel LucksAllred's recommendation.   7.  HTN - she is not going to let me increase or add medicine. Would keep her on her current regimen.   She remains quite clear in her preference for conservative approach.  Current medicines are reviewed with the patient today.  The patient does not have concerns regarding medicines other than what has been noted above.  The following changes have been made:  See above.  Labs/ tests ordered today include:   No orders of the defined types were placed in this encounter.    Disposition:   FU with Dr. Johney FrameAllred in 6 months.   Patient is agreeable to this plan and will call if any problems develop in the interim.   Signed: Rosalio MacadamiaLori C. Arisha Gervais, RN, ANP-C 05/16/2016 12:33 PM  [facility] [name] [place], [place]  [address] Phone: [phone] Fax: [phone]) [phone]

## 2016-05-16 NOTE — Addendum Note (Signed)
Addended by: Channing MuttersGRACE, Eldean Nanna J on: 05/16/2016 01:03 PM   Modules accepted: Orders

## 2016-05-17 LAB — CUP PACEART INCLINIC DEVICE CHECK
Brady Statistic AP VP Percent: 0.1 %
Brady Statistic AP VS Percent: 9.4 %
Brady Statistic AS VP Percent: 0.1 %
Brady Statistic AS VS Percent: 90.5 %
Date Time Interrogation Session: 20170809132037
Implantable Lead Implant Date: 20090812
Implantable Lead Implant Date: 20090812
Implantable Lead Location: 753859
Implantable Lead Location: 753860
Implantable Lead Model: 5076
Implantable Lead Model: 5076
Lead Channel Pacing Threshold Amplitude: 1 V
Lead Channel Pacing Threshold Amplitude: 2 V
Lead Channel Pacing Threshold Pulse Width: 0.4 ms
Lead Channel Pacing Threshold Pulse Width: 1 ms
Lead Channel Sensing Intrinsic Amplitude: 1.3 mV
Lead Channel Sensing Intrinsic Amplitude: 20.3 mV
Lead Channel Setting Pacing Amplitude: 2 V
Lead Channel Setting Pacing Amplitude: 4 V
Lead Channel Setting Pacing Pulse Width: 0.4 ms
Lead Channel Setting Sensing Sensitivity: 0.9 mV

## 2016-05-17 LAB — BASIC METABOLIC PANEL
BUN: 16 mg/dL (ref 7–25)
CO2: 26 mmol/L (ref 20–31)
Calcium: 9.6 mg/dL (ref 8.6–10.4)
Chloride: 106 mmol/L (ref 98–110)
Creat: 0.87 mg/dL (ref 0.60–0.88)
Glucose, Bld: 85 mg/dL (ref 65–99)
Potassium: 4.7 mmol/L (ref 3.5–5.3)
Sodium: 145 mmol/L (ref 135–146)

## 2016-05-17 LAB — MAGNESIUM: Magnesium: 2.2 mg/dL (ref 1.5–2.5)

## 2016-05-28 ENCOUNTER — Other Ambulatory Visit: Payer: Self-pay | Admitting: Internal Medicine

## 2016-06-15 ENCOUNTER — Telehealth: Payer: Self-pay | Admitting: Cardiology

## 2016-06-15 ENCOUNTER — Ambulatory Visit (INDEPENDENT_AMBULATORY_CARE_PROVIDER_SITE_OTHER): Payer: Medicare Other | Admitting: *Deleted

## 2016-06-15 DIAGNOSIS — Z950 Presence of cardiac pacemaker: Secondary | ICD-10-CM

## 2016-06-15 NOTE — Telephone Encounter (Signed)
Spoke with pt and reminded pt of remote transmission that is due today. Pt verbalized understanding.   

## 2016-06-18 ENCOUNTER — Encounter: Payer: Self-pay | Admitting: Cardiology

## 2016-06-18 NOTE — Progress Notes (Signed)
Remote pacemaker transmission.   

## 2016-06-21 ENCOUNTER — Other Ambulatory Visit: Payer: Self-pay | Admitting: Nurse Practitioner

## 2016-06-25 LAB — CUP PACEART REMOTE DEVICE CHECK
Brady Statistic AP VP Percent: 0.01 %
Brady Statistic AP VS Percent: 7.93 %
Brady Statistic AS VS Percent: 92.05 %
Date Time Interrogation Session: 20170910162340
Implantable Lead Implant Date: 20090812
Implantable Lead Location: 753860
Implantable Lead Model: 5076
Lead Channel Impedance Value: 384 Ohm
Lead Channel Sensing Intrinsic Amplitude: 1.32 mV
Lead Channel Sensing Intrinsic Amplitude: 20.266 mV
Lead Channel Setting Pacing Pulse Width: 0.4 ms
MDC IDC LEAD IMPLANT DT: 20090812
MDC IDC LEAD LOCATION: 753859
MDC IDC MSMT BATTERY VOLTAGE: 2.86 V
MDC IDC MSMT LEADCHNL RV IMPEDANCE VALUE: 568 Ohm
MDC IDC SET LEADCHNL RA PACING AMPLITUDE: 4 V
MDC IDC SET LEADCHNL RV PACING AMPLITUDE: 2 V
MDC IDC SET LEADCHNL RV SENSING SENSITIVITY: 0.9 mV
MDC IDC STAT BRADY AS VP PERCENT: 0.02 %
MDC IDC STAT BRADY RA PERCENT PACED: 7.93 %
MDC IDC STAT BRADY RV PERCENT PACED: 0.02 %

## 2016-07-19 ENCOUNTER — Telehealth: Payer: Self-pay | Admitting: Cardiology

## 2016-07-19 ENCOUNTER — Ambulatory Visit (INDEPENDENT_AMBULATORY_CARE_PROVIDER_SITE_OTHER): Payer: Medicare Other | Admitting: *Deleted

## 2016-07-19 DIAGNOSIS — I495 Sick sinus syndrome: Secondary | ICD-10-CM

## 2016-07-19 NOTE — Telephone Encounter (Signed)
Spoke with pt and reminded pt of remote transmission that is due today. Pt verbalized understanding.   

## 2016-07-19 NOTE — Progress Notes (Signed)
Remote pacemaker transmission.   

## 2016-07-20 ENCOUNTER — Encounter: Payer: Self-pay | Admitting: Cardiology

## 2016-08-14 ENCOUNTER — Other Ambulatory Visit: Payer: Self-pay | Admitting: Internal Medicine

## 2016-08-16 ENCOUNTER — Emergency Department (HOSPITAL_COMMUNITY): Payer: Medicare Other

## 2016-08-16 ENCOUNTER — Other Ambulatory Visit: Payer: Self-pay

## 2016-08-16 ENCOUNTER — Observation Stay (HOSPITAL_COMMUNITY)
Admission: EM | Admit: 2016-08-16 | Discharge: 2016-08-17 | Disposition: A | Discharge status: Home / Self Care | Payer: Medicare Other | Attending: Infectious Diseases | Admitting: Infectious Diseases

## 2016-08-16 ENCOUNTER — Encounter (HOSPITAL_COMMUNITY): Payer: Self-pay

## 2016-08-16 DIAGNOSIS — Z66 Do not resuscitate: Secondary | ICD-10-CM | POA: Diagnosis not present

## 2016-08-16 DIAGNOSIS — E785 Hyperlipidemia, unspecified: Secondary | ICD-10-CM | POA: Diagnosis not present

## 2016-08-16 DIAGNOSIS — Z880 Allergy status to penicillin: Secondary | ICD-10-CM | POA: Insufficient documentation

## 2016-08-16 DIAGNOSIS — I700 Atherosclerosis of aorta: Secondary | ICD-10-CM | POA: Insufficient documentation

## 2016-08-16 DIAGNOSIS — I2510 Atherosclerotic heart disease of native coronary artery without angina pectoris: Secondary | ICD-10-CM | POA: Insufficient documentation

## 2016-08-16 DIAGNOSIS — Z952 Presence of prosthetic heart valve: Secondary | ICD-10-CM | POA: Insufficient documentation

## 2016-08-16 DIAGNOSIS — F329 Major depressive disorder, single episode, unspecified: Secondary | ICD-10-CM | POA: Diagnosis not present

## 2016-08-16 DIAGNOSIS — Z881 Allergy status to other antibiotic agents status: Secondary | ICD-10-CM

## 2016-08-16 DIAGNOSIS — Z951 Presence of aortocoronary bypass graft: Secondary | ICD-10-CM | POA: Insufficient documentation

## 2016-08-16 DIAGNOSIS — R05 Cough: Secondary | ICD-10-CM

## 2016-08-16 DIAGNOSIS — E7800 Pure hypercholesterolemia, unspecified: Secondary | ICD-10-CM | POA: Diagnosis not present

## 2016-08-16 DIAGNOSIS — I110 Hypertensive heart disease with heart failure: Secondary | ICD-10-CM | POA: Diagnosis not present

## 2016-08-16 DIAGNOSIS — B009 Herpesviral infection, unspecified: Secondary | ICD-10-CM | POA: Insufficient documentation

## 2016-08-16 DIAGNOSIS — B019 Varicella without complication: Secondary | ICD-10-CM | POA: Insufficient documentation

## 2016-08-16 DIAGNOSIS — J441 Chronic obstructive pulmonary disease with (acute) exacerbation: Principal | ICD-10-CM | POA: Insufficient documentation

## 2016-08-16 DIAGNOSIS — I252 Old myocardial infarction: Secondary | ICD-10-CM | POA: Diagnosis not present

## 2016-08-16 DIAGNOSIS — F17210 Nicotine dependence, cigarettes, uncomplicated: Secondary | ICD-10-CM | POA: Insufficient documentation

## 2016-08-16 DIAGNOSIS — R0602 Shortness of breath: Secondary | ICD-10-CM | POA: Diagnosis present

## 2016-08-16 DIAGNOSIS — Z950 Presence of cardiac pacemaker: Secondary | ICD-10-CM

## 2016-08-16 DIAGNOSIS — K219 Gastro-esophageal reflux disease without esophagitis: Secondary | ICD-10-CM | POA: Diagnosis not present

## 2016-08-16 DIAGNOSIS — Z7901 Long term (current) use of anticoagulants: Secondary | ICD-10-CM | POA: Diagnosis not present

## 2016-08-16 DIAGNOSIS — I5023 Acute on chronic systolic (congestive) heart failure: Secondary | ICD-10-CM | POA: Diagnosis not present

## 2016-08-16 DIAGNOSIS — B0230 Zoster ocular disease, unspecified: Secondary | ICD-10-CM

## 2016-08-16 DIAGNOSIS — I4891 Unspecified atrial fibrillation: Secondary | ICD-10-CM

## 2016-08-16 DIAGNOSIS — Z888 Allergy status to other drugs, medicaments and biological substances status: Secondary | ICD-10-CM

## 2016-08-16 DIAGNOSIS — R059 Cough, unspecified: Secondary | ICD-10-CM

## 2016-08-16 LAB — BASIC METABOLIC PANEL
Anion gap: 8 (ref 5–15)
BUN: 11 mg/dL (ref 6–20)
CO2: 21 mmol/L — ABNORMAL LOW (ref 22–32)
Calcium: 8.9 mg/dL (ref 8.9–10.3)
Chloride: 110 mmol/L (ref 101–111)
Creatinine, Ser: 0.83 mg/dL (ref 0.44–1.00)
GFR calc Af Amer: >60 mL/min (ref >60)
GFR calc non Af Amer: >60 mL/min (ref >60)
Glucose, Bld: 114 mg/dL — ABNORMAL HIGH (ref 65–99)
Potassium: 4.2 mmol/L (ref 3.5–5.1)
Sodium: 139 mmol/L (ref 135–145)

## 2016-08-16 LAB — CBC WITH DIFFERENTIAL/PLATELET
Basophils Absolute: 0 10*3/uL (ref 0.0–0.1)
Basophils Relative: 0 %
Eosinophils Absolute: 0 10*3/uL (ref 0.0–0.7)
Eosinophils Relative: 0 %
HCT: 44.4 % (ref 36.0–46.0)
Hemoglobin: 14.4 g/dL (ref 12.0–15.0)
Lymphocytes Relative: 7 %
Lymphs Abs: 1 10*3/uL (ref 0.7–4.0)
MCH: 29.4 pg (ref 26.0–34.0)
MCHC: 32.4 g/dL (ref 30.0–36.0)
MCV: 90.6 fL (ref 78.0–100.0)
Monocytes Absolute: 0.6 10*3/uL (ref 0.1–1.0)
Monocytes Relative: 4 %
Neutro Abs: 12.2 10*3/uL — ABNORMAL HIGH (ref 1.7–7.7)
Neutrophils Relative %: 89 %
Platelets: 214 10*3/uL (ref 150–400)
RBC: 4.9 MIL/uL (ref 3.87–5.11)
RDW: 16.1 % — ABNORMAL HIGH (ref 11.5–15.5)
WBC: 13.9 10*3/uL — ABNORMAL HIGH (ref 4.0–10.5)

## 2016-08-16 LAB — PROTIME-INR
INR: 0.99
Prothrombin Time: 13.1 seconds (ref 11.4–15.2)

## 2016-08-16 LAB — I-STAT TROPONIN, ED: Troponin i, poc: 0.12 ng/mL (ref 0.00–0.08)

## 2016-08-16 LAB — TROPONIN I: Troponin I: 0.15 ng/mL (ref <0.03)

## 2016-08-16 LAB — BRAIN NATRIURETIC PEPTIDE: B Natriuretic Peptide: 517.4 pg/mL — ABNORMAL HIGH (ref 0.0–100.0)

## 2016-08-16 LAB — PROCALCITONIN

## 2016-08-16 MED ORDER — METOPROLOL TARTRATE 25 MG PO TABS
12.5000 mg | ORAL_TABLET | Freq: Two times a day (BID) | ORAL | Status: DC
  Administered 2016-08-16: 12.5 mg via ORAL
  Filled 2016-08-16 (×2): qty 1

## 2016-08-16 MED ORDER — PREDNISONE 20 MG PO TABS
40.0000 mg | ORAL_TABLET | Freq: Every day | ORAL | Status: DC
  Administered 2016-08-17: 40 mg via ORAL
  Filled 2016-08-16: qty 2

## 2016-08-16 MED ORDER — VITAMIN D 1000 UNITS PO TABS
2000.0000 [IU] | ORAL_TABLET | Freq: Every day | ORAL | Status: DC
  Administered 2016-08-17: 2000 [IU] via ORAL
  Filled 2016-08-16: qty 2

## 2016-08-16 MED ORDER — PANTOPRAZOLE SODIUM 40 MG PO TBEC
40.0000 mg | DELAYED_RELEASE_TABLET | Freq: Every day | ORAL | Status: DC
  Administered 2016-08-17: 40 mg via ORAL
  Filled 2016-08-16: qty 1

## 2016-08-16 MED ORDER — ACETAMINOPHEN 650 MG RE SUPP: 650.0000 mg | Freq: Four times a day (QID) | RECTAL | Status: DC | PRN

## 2016-08-16 MED ORDER — ENOXAPARIN SODIUM 40 MG/0.4ML ~~LOC~~ SOLN
40.0000 mg | SUBCUTANEOUS | Status: DC
  Administered 2016-08-16: 40 mg via SUBCUTANEOUS
  Filled 2016-08-16: qty 0.4

## 2016-08-16 MED ORDER — SENNOSIDES-DOCUSATE SODIUM 8.6-50 MG PO TABS: 1.0000 | ORAL_TABLET | Freq: Every evening | ORAL | Status: DC | PRN

## 2016-08-16 MED ORDER — IPRATROPIUM-ALBUTEROL 0.5-2.5 (3) MG/3ML IN SOLN: 3.0000 mL | Freq: Four times a day (QID) | RESPIRATORY_TRACT | Status: DC | PRN

## 2016-08-16 MED ORDER — DOFETILIDE 250 MCG PO CAPS
250.0000 ug | ORAL_CAPSULE | Freq: Two times a day (BID) | ORAL | Status: DC
  Administered 2016-08-16 – 2016-08-17 (×2): 250 ug via ORAL
  Filled 2016-08-16 (×2): qty 1

## 2016-08-16 MED ORDER — IPRATROPIUM-ALBUTEROL 0.5-2.5 (3) MG/3ML IN SOLN
3.0000 mL | Freq: Four times a day (QID) | RESPIRATORY_TRACT | Status: DC
  Filled 2016-08-16: qty 3

## 2016-08-16 MED ORDER — VALACYCLOVIR HCL 500 MG PO TABS
1000.0000 mg | ORAL_TABLET | Freq: Every day | ORAL | Status: DC
  Administered 2016-08-17: 1000 mg via ORAL
  Filled 2016-08-16: qty 2

## 2016-08-16 MED ORDER — AZITHROMYCIN 500 MG IV SOLR
500.0000 mg | Freq: Once | INTRAVENOUS | Status: AC
  Administered 2016-08-16: 500 mg via INTRAVENOUS
  Filled 2016-08-16: qty 500

## 2016-08-16 MED ORDER — ACETAMINOPHEN 325 MG PO TABS: 650.0000 mg | ORAL_TABLET | Freq: Four times a day (QID) | ORAL | Status: DC | PRN

## 2016-08-16 MED ORDER — FUROSEMIDE 10 MG/ML IJ SOLN
40.0000 mg | Freq: Once | INTRAMUSCULAR | Status: AC
  Administered 2016-08-16: 40 mg via INTRAVENOUS
  Filled 2016-08-16: qty 4

## 2016-08-16 MED ORDER — DEXTROSE 5 % IV SOLN
1.0000 g | Freq: Once | INTRAVENOUS | Status: AC
  Administered 2016-08-16: 1 g via INTRAVENOUS
  Filled 2016-08-16: qty 10

## 2016-08-16 MED ORDER — AZITHROMYCIN 250 MG PO TABS
250.0000 mg | ORAL_TABLET | Freq: Every day | ORAL | Status: DC
  Administered 2016-08-17: 250 mg via ORAL
  Filled 2016-08-16: qty 1

## 2016-08-16 MED ORDER — PREDNISOLONE ACETATE 1 % OP SUSP
1.0000 [drp] | OPHTHALMIC | Status: DC
  Administered 2016-08-17: 1 [drp] via OPHTHALMIC
  Filled 2016-08-16: qty 1

## 2016-08-16 MED ORDER — SODIUM CHLORIDE 0.9% FLUSH
3.0000 mL | Freq: Two times a day (BID) | INTRAVENOUS | Status: DC
  Administered 2016-08-16: 3 mL via INTRAVENOUS

## 2016-08-16 NOTE — H&P (Signed)
Date: 08/16/2016               Patient Name:  Kim CroftBarbara [name] MRN: [id number]  DOB: [birth year] Age / Sex: 82 y.o., female   PCP: Willow Oraamille L Andy, MD         Medical Service: Internal Medicine Teaching Service         Attending Physician: Dr. Ginnie SmartJeffrey C Hatcher, MD    First Contact: Dr. Peggyann Juba'Sullivan Pager: [phone]  Second Contact: Dr. Dimple Caseyice Pager: [phone]       After Hours (After 5p/  First Contact Pager: [phone]  weekends / holidays): Second Contact Pager: [phone]   Chief Complaint: Dyspnea  History of Present Illness: 82 year old woman with history of atrial fibrillation s/p MAZE in 2009 and previously on coumadin now on Tikosyn, chronic systolic CHF, CAD s/p BMS in 1996, DES in 2008, CABG with mitral valve repair in 2009, HLD, HTN, sick sinus syndrome s/p pacemaker in 2009, depression presenting with dyspnea.  Her dyspnea started yesterday. Started at rest. It was intermittent. She has orthopnea. She also had PND. She has a cough productive of beige sputum. She denies fevers, chills, chest pain, nausea, vomiting, abdominal pain, dysuria, hematuria, lightheadedness. Denies recent travel, leg pain or swelling. No sick contacts.  Meds:  Current Meds  Medication Sig  . carboxymethylcellulose (REFRESH PLUS) 0.5 % SOLN Place 1 drop into both eyes 3 (three) times daily as needed (d).  . Cholecalciferol (VITAMIN D) 2000 UNITS tablet Take 2,000 Units by mouth daily.    Marland Kitchen. dofetilide (TIKOSYN) 250 MCG capsule TAKE 1 CAPSULE BY MOUTH TWICE A DAY  . furosemide (LASIX) 20 MG tablet Take 1 tablet (20 mg total) by mouth daily.  . metoprolol tartrate (LOPRESSOR) 25 MG tablet TAKE 0.5 TABLETS (12.5 MG TOTAL) BY MOUTH 2 (TWO) TIMES DAILY.  Marland Kitchen. OVER THE COUNTER MEDICATION Place 1 drop into both eyes daily as needed (for dry eyes). Eye Drops  . pantoprazole (PROTONIX) 40 MG tablet TAKE 1 TABLET (40 MG TOTAL) BY MOUTH DAILY.  Marland Kitchen. potassium chloride SA (K-DUR,KLOR-CON) 20 MEQ tablet Take 1 tablet (20 mEq  total) by mouth 2 (two) times daily.  . prednisoLONE acetate (PRED FORTE) 1 % ophthalmic suspension Place 1 drop into the right eye every morning.  . valACYclovir (VALTREX) 1000 MG tablet Take 1 g by mouth daily.     Allergies: Allergies as of 08/16/2016 - Review Complete 08/16/2016  Allergen Reaction Noted  . Amoxicillin Other (See Comments) 11/17/2015  . Amiodarone  05/07/2011  . Atorvastatin  11/15/2014  . Diphenhydramine hcl Other (See Comments) 05/09/2011  . Rosuvastatin  11/15/2014   Past Medical History:  Diagnosis Date  . Anxiety   . Atrial fibrillation (HCC)    a. in sinus on Tikosyn and coumadin;  b. s/p MAZE @ time of MVR/CABG 05/2008;  c. h/o pulm toxicity while on amio.  . Chronic anticoagulation    coumadin  . Chronic systolic CHF (congestive heart failure) (HCC)    a. 02/2013 Echo: EF 35% inflat AK, mod to sev calcified MV annulus w/o MS, mod dil LA, PASP 33mmHg.  Marland Kitchen. Coronary artery disease    a. 1996 s/p PS BMS;  b. 03/2007 NSTEMI/PCI: 3.0x24 and 3.0x28 Taxus DES' t RCA; c. 05/2008 CABG x 2 with MV repair and Maze (LIMA->LAD, VG->PDA)  . Depression   . High risk medication use    Tikosyn  . Hyperlipidemia   . Hypertension   . IDA (iron deficiency anemia)   .  LBBB (left bundle branch block)   . Mitral insufficiency    a. 05/2008 s/p MV repair with [employer] MV ring (model 5200, 26mm ser # [id number].  . Subarachnoid hemorrhage (HCC)    a. in setting of syncope/fall/digoxin toxicity 11/2007.  Alcide Clever. Tachycardia-bradycardia [facility](HCC)    a. 05/2008 s/p MDT P1501DR Enrhythm DC PPM, ser # [id number] H.    Family History: Sisters had heart disease  Social History:  3-4 cigarettes a day since age 82. Stopped 4-5 times before. No alcohol No illicits  Review of Systems: A complete ROS was negative except as per HPI.   Physical Exam: Blood pressure 145/77, pulse 95, temperature 97.9 F (36.6 C), temperature source Oral, resp. rate 25, height 5\' 5"  (1.651 m), weight 147  lb (66.7 kg), SpO2 94 %. General Apperance: NAD Head: Normocephalic, atraumatic Eyes: PERRL, EOMI, anicteric sclera Ears: Normal external ear canal Nose: Nares normal, septum midline, mucosa normal Throat: Lips, mucosa and tongue normal  Neck: Supple, trachea midline Back: No tenderness or bony abnormality  Lungs: Course breath sounsds bilateral lung fields that sound mainly upper airway Chest Wall: Nontender, no deformity Heart: Regular rate and rhythm, no murmur/rub/gallop Abdomen: Soft, nontender, nondistended, no rebound/guarding Extremities: Normal, atraumatic, warm and well perfused, no edema Pulses: 2+ throughout Skin: No rashes or lesions Neurologic: Alert and oriented x 3. CNII-XII intact. Normal strength and sensation   EKG: Normal sinus rhythm with PVCs, LBBB, unchanged from previous EKG.  CXR: Bibasilar opacities. Small effusion on left.  Assessment & Plan by Problem: 82 year old woman with history of atrial fibrillation s/p MAZE in 2009 now on Tikosyn and Coumadin, chronic systolic CHF, CAD s/p BMS in 1996, DES in 2008, CABG with mitral valve repair in 2009, HLD, HTN, sick sinus syndrome s/p pacemaker in 2009, depression presenting with dyspnea.  Acute on chronic systolic CHF: Smoker with history of CHF presenting with dyspnea x 1 day with PND and orthopnea. She denies chest pain. She does have a cough productive of beige sputum. Echo done in 2014 with LV EF 35%. No wheezes appreciated on exam making a COPD exacerbation less likely. BNP 517.4. Troponin 0.12 and EKG without acute ischemic changes. She is afebrile but WBC 13.9. CXR with bibasilar opacities. Low risk for PE by Wells criteria. She received ceftriaxone, azithromycin and 40mg  of IV Lasix in the ED. -Will discontinue antibiotics since her procalcitonin is <0.1 -Reevaluate in AM to determine if she requires more IV Lasix -Strict in/out, daily weights -Echo -Repeat XR in 3-4 weeks -Trend troponin -Repeat EKG in  AM -Ambulate with pulse ox tomorrow -Continue home metoprolol tartrate 12.5mg  BID  History of atrial fibrillation: Continue home Tikosyn. Appears to be in sinus presently  GERD: Continue home pantoprazole 40mg  daily  Herpes simplex ophthalmicus: Seen for this by her PCP in September. Was referred to ophtho. Continue home Valtrex 1000mg  daily.  FEN: HH VTE ppx: Subq Lovenox Code status: DNR confirmed with patient   Dispo: Admit patient to Observation with expected length of stay less than 2 midnights.  Signed: Lora PaulaJennifer T Zoya Sprecher, MD 08/16/2016, 12:45 PM  Pager: [phone]

## 2016-08-16 NOTE — H&P (Addendum)
  Date: 08/16/2016  Patient name: Kim CroftBarbara Ehrmann  Medical record number: [id number]  Date of birth: 15-Mar-1930   I have seen and evaluated Kim CroftBarbara Henton and discussed their care with the Residency Team.  82 yo F with hx of MAZE procedure, MV repair, CBG x2 and pacemaker placed 2009. Also on Tikosyn for afib, CHF (EF 35% May 2014).  Her course has also been complicated by R opthalmic/temporal shingles Feb 2017.  She awoke suddenly this AM with SOB. She has since had cough. She denies CP, fever or chills, lightheadedness, sick exposures, LE edema, previous episodes of sudden awakening.   In ED she was satting mid-90s on RA, 15-18   PMHx, Fam Hx, and/or Soc Hx : see h/o's note.  She is widowed x 6 years. She lives with her son. She smokes 1/4 ppd, smoking > 60 years.   Vitals:   08/16/16 1406 08/16/16 1452  BP: 118/63 122/64  Pulse: 96 92  Resp: 19 20  Temp:    Eyes- R pupil larger than L. EOMI.  Mouth- without lesion Neck- no JVP/JVD Chest- decreased breath sounds at bases.  CV- RRR Abd- BS+, soft, nontender. No HJR.  Extr- no edema.   Labs: Troponin 0.12  BNP 517.4 Procalcitonin (-) WBC 13.9  ECG- sinus 99 bpm, intermittent PVC, unchanged from prior.  WUJ:WJXBCXR:COPD. Bibasilar atelectasis or early pneumonia. Low-grade compensated CHF. Followup PA and lateral chest X-ray is recommended in 3-4 weeks following trial of antibiotic therapy to ensure resolution and exclude underlying malignancy.  Aortic atherosclerosis. .   Assessment and Plan: I have seen and evaluated the patient as outlined above. I agree with the formulated Assessment and Plan as detailed in the residents' note, with the following changes:   1. COPD exacerbation Will give her nebs IV steroids Will discuss with team regarding use of antibiotics (continue azithro, stop ceftriaxone) Respiratory virus panel.   2. CHF Flash pulmonary edema? 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   Ginnie SmartJeffrey C Berta Denson, MD 11/9/20173:35 PM

## 2016-08-16 NOTE — ED Triage Notes (Signed)
BIB [facility] from home. Pt. Reports to EMS that she became SOB in the night with productive cough. Denies pain, dizziness, nausea, and vomiting for EMS. Pt. Stated she was concerned because of "hx. Of CHF and Hx of fluid on lungs". Pt 02 saturation on room air when Fire arrived was 88%. With EMS on 4L pt at 94% [place].

## 2016-08-16 NOTE — Discharge Summary (Signed)
Name: Kim CroftBarbara [name] MRN: [id number] DOB: 13-Jun-1930 82 y.o. PCP: Willow Oraamille L Andy, MD  Date of Admission: 08/16/2016  9:12 AM Date of Discharge: 08/17/2016 Attending Physician: Johny SaxJeffrey Hatcher, MD  Discharge Diagnosis: 1. COPD Exacerbation  2. CHF Exacerbation  Active Problems:   Shortness of breath   Discharge Medications:   Medication List    TAKE these medications   azithromycin 250 MG tablet Commonly known as:  ZITHROMAX Take 1 tablet (250 mg total) by mouth daily. Start taking on:  08/18/2016   carboxymethylcellulose 0.5 % Soln Commonly known as:  REFRESH PLUS Place 1 drop into both eyes 3 (three) times daily as needed (d).   dofetilide 250 MCG capsule Commonly known as:  TIKOSYN TAKE 1 CAPSULE BY MOUTH TWICE A DAY   furosemide 20 MG tablet Commonly known as:  LASIX Take 2 tablets (40 mg total) by mouth daily. What changed:  how much to take   metoprolol tartrate 25 MG tablet Commonly known as:  LOPRESSOR TAKE 0.5 TABLETS (12.5 MG TOTAL) BY MOUTH 2 (TWO) TIMES DAILY.   OVER THE COUNTER MEDICATION Place 1 drop into both eyes daily as needed (for dry eyes). Eye Drops   pantoprazole 40 MG tablet Commonly known as:  PROTONIX TAKE 1 TABLET (40 MG TOTAL) BY MOUTH DAILY.   potassium chloride SA 20 MEQ tablet Commonly known as:  K-DUR,KLOR-CON Take 1 tablet (20 mEq total) by mouth 2 (two) times daily.   prednisoLONE acetate 1 % ophthalmic suspension Commonly known as:  PRED FORTE Place 1 drop into the right eye every morning.   predniSONE 20 MG tablet Commonly known as:  DELTASONE Take 2 tablets (40 mg total) by mouth daily with breakfast. Start taking on:  08/18/2016   valACYclovir 1000 MG tablet Commonly known as:  VALTREX Take 1 g by mouth daily.   Vitamin D 2000 units tablet Take 2,000 Units by mouth daily.       Disposition and follow-up:   [name] St[facility].  At the  hospital follow up visit please address:  1.  COPD.  With her long smoking history, dyspnea, and increased sputum production, she seemed to be having a COPD exacerbation.  Consider baseline PFTs to diagnose possible obstructive disease.  2.  Diuretics.  She was discharged on increased dose of Lasix due to concern for mild volume overload contributing to her dyspnea.  Please assess volume status and titrate Lasix accordingly.  2.  Labs / imaging needed at time of follow-up: PFTs, consider repeat CXR  3.  Pending labs/ test needing follow-up: none  Follow-up Appointments: Follow-up Information    [name],Kim L, MD. Go in 7 day(s).   Specialty:  Family Medicine Why:  at 10:15 Contact information: [address] [place] [address] [phone]           Hospital Course by problem list: Active Problems:   Shortness of breath   1. COPD Exacerbation [name] presented with 1-2 days of dyspnea, cough, and increased sputum production.  She does not have a diagnosis of COPD, but has a 30+ pack year smoking history, currently smokes a few cigarettes a day, and has hyperinflated lungs on CXR consistent with COPD.  She had a mild leukocytosis and chest radiographs with mild bibasilar patchy opacities, and received empiric ceftriaxone and azithromycin in the ED with concern for CAP. Notably, though, she had no wheezing.  She was given nebulizer treatments  and started on 5 day course of prednisone and azithromycin for COPD exacerbation and her breathing returned to baseline.  2. CHF Exacerbation With her history of CHF, moderately elevated BNP, and bibasilar crackles, pulmonary edema from CHF exacerbation was thought to be a contributing factor to her dyspnea.  She had no JVD or peripheral edema.  She was gently diuresed with IV lasix 40 mg.  On the day after admission, her breathing was at baseline but crackles remained on exam.  She was discharged on increased dose of PO  lasix until follow-up with her PCP in a week.  3. Troponinemia Slightly elevated troponins with nearly flat trend.  No chest pain, EKG without ischemic changes.  Deemed to represent mild demand ischemia from tachycardia.  Discharge Vitals:   BP (!) 122/47   Pulse 74   Temp 97.8 F (36.6 C) (Oral)   Resp 18   Ht 5\' 5"  (1.651 m)   Wt 153 lb 11.2 oz (69.7 kg) Comment: Scale B  SpO2 93% Comment: While ambulating  BMI 25.58 kg/m   Pertinent Labs, Studies, and Procedures:   CBC Latest Ref Rng & Units 08/17/2016 08/16/2016 05/18/2015  WBC 4.0 - 10.5 K/uL 7.1 13.9(H) 8.8  Hemoglobin 12.0 - 15.0 g/dL 16.113.4 09.614.4 04.513.9  Hematocrit 36.0 - 46.0 % 41.3 44.4 42.3  Platelets 150 - 400 K/uL 200 214 267.0   BMP Latest Ref Rng & Units 08/17/2016 08/16/2016 05/16/2016  Glucose 65 - 99 mg/dL 94 409(W114(H) 85  BUN 6 - 20 mg/dL 15 11 16   Creatinine 0.44 - 1.00 mg/dL 1.190.97 1.470.83 8.290.87  Sodium 135 - 145 mmol/L 139 139 145  Potassium 3.5 - 5.1 mmol/L 3.6 4.2 4.7  Chloride 101 - 111 mmol/L 105 110 106  CO2 22 - 32 mmol/L 26 21(L) 26  Calcium 8.9 - 10.3 mg/dL 5.6(O8.8(L) 8.9 9.6   BNP    Component Value Date/Time   BNP 517.4 (H) 08/16/2016 1017   Cardiac Panel (last 3 results)  Recent Labs  08/16/16 1644 08/16/16 2253 08/17/16 0822  TROPONINI 0.15* 0.19* 0.13*   Component     Latest Ref Rng & Units 08/16/2016  Procalcitonin     ng/mL <0.10   Component     Latest Ref Rng & Units 08/16/2016  Adenovirus     NOT DETECTED NOT DETECTED  Coronavirus 229E     NOT DETECTED NOT DETECTED  Coronavirus HKU1     NOT DETECTED NOT DETECTED  Coronavirus NL63     NOT DETECTED NOT DETECTED  Coronavirus OC43     NOT DETECTED NOT DETECTED  Metapneumovirus     NOT DETECTED NOT DETECTED  Rhinovirus / Enterovirus     NOT DETECTED NOT DETECTED  Influenza A     NOT DETECTED NOT DETECTED  Influenza B     NOT DETECTED NOT DETECTED  Parainfluenza Virus 1     NOT DETECTED NOT DETECTED  Parainfluenza Virus 2     NOT  DETECTED NOT DETECTED  Parainfluenza Virus 3     NOT DETECTED NOT DETECTED  Parainfluenza Virus 4     NOT DETECTED NOT DETECTED  Respiratory Syncytial Virus     NOT DETECTED NOT DETECTED  Bordetella pertussis     NOT DETECTED NOT DETECTED  Chlamydophila pneumoniae     NOT DETECTED NOT DETECTED  Mycoplasma pneumoniae     NOT DETECTED NOT DETECTED   Chest Radiographs 08/16/16 IMPRESSION: COPD. Bibasilar atelectasis or early pneumonia. Low-grade compensated CHF. Followup PA  and lateral chest X-ray is recommended in 3-4 weeks following trial of antibiotic therapy to ensure resolution and exclude underlying malignancy.  Aortic atherosclerosis.  Discharge Instructions: Discharge Instructions    Diet - low sodium heart healthy    Complete by:  As directed    Increase activity slowly    Complete by:  As directed      You were admitted to the hospital with shortness of breath that we think may be due to both COPD and fluid on your lungs from heart failure.  We treated you with a diuretic to help you urinate off that fluid, and antibiotics  Please review your medication list since there have been changes.  I have prescribed azithromycin, an antibiotic, and prednisone, a steroid, to take for 3 more days for COPD exacerbation.  I have also increased your lasix from 20 mg to 40 mg.  Please follow up with Dr Modesta Messing office next Friday 11/17.  If you have worsening shortness of breath, chest pain, or fevers, please call your doctor or 911 if it is an emergency.  Signed: Alm Bustard, MD 08/17/2016, 10:12 PM   Pager: [phone]

## 2016-08-16 NOTE — Progress Notes (Addendum)
Resp. Nasal swap sent. Droplet precaution in place to r/o infection

## 2016-08-16 NOTE — ED Provider Notes (Signed)
[facility]-EMERGENCY DEPT Provider Note   CSN: [id number] Arrival date & time: 08/16/16  4098     History   Chief Complaint Chief Complaint  Patient presents with  . Shortness of Breath    HPI [name] is a 82 y.o. female with history of CHF, A. Fib, hypertension. Who presents with a one-day history of productive cough with off-white sputum and shortness of breath. Patient woke up in the middle the night last night with shortness of breath. She only has shortness of breath with lying down. Patient denies any chest pain, fevers, abdominal pain, nausea, vomiting, urinary symptoms, lightheadedness, dizziness. Patient is on Lasix 20 mg daily. Patient denies any recent long trips, new leg pain or swelling, surgeries, cancer, or exogenous estrogen use. Patient is a smoker. Patient is not on at home oxygen. She has not been recently treated with prednisone.  HPI  Past Medical History:  Diagnosis Date  . Anxiety   . Atrial fibrillation (HCC)    a. in sinus on Tikosyn and coumadin;  b. s/p MAZE @ time of MVR/CABG 05/2008;  c. h/o pulm toxicity while on amio.  . Chronic anticoagulation    coumadin  . Chronic systolic CHF (congestive heart failure) (HCC)    a. 02/2013 Echo: EF 35% inflat AK, mod to sev calcified MV annulus w/o MS, mod dil LA, PASP .  Marland Kitchen Coronary artery disease    a. 1996 s/p PS BMS;  b. 03/2007 NSTEMI/PCI: 3.0x24 and 3.0x28 Taxus DES' t RCA; c. 05/2008 CABG x 2 with MV repair and Maze (LIMA->LAD, VG->PDA)  . Depression   . High risk medication use    Tikosyn  . Hyperlipidemia   . Hypertension   . IDA (iron deficiency anemia)   . LBBB (left bundle branch block)   . Mitral insufficiency    a. 05/2008 s/p MV repair with [employer] MV ring (model 5200, 26mm ser # [id number].  . Subarachnoid hemorrhage (HCC)    a. in setting of syncope/fall/digoxin toxicity 11/2007.  [facility])    a. 05/2008 s/p MDT [id number] Enrhythm DC PPM, ser # [id number].     Patient Active Problem List   Diagnosis Date Noted  . Shortness of breath 08/16/2016  . CHF (congestive heart failure) (HCC) 04/08/2014  . Essential hypertension 11/09/2013  . Microcytic anemia 08/11/2013  . Acute on chronic systolic heart failure (HCC) 02/21/2013  . Acute bronchitis 02/21/2013  . Community acquired pneumonia 04/09/2012  . Hypercholesteremia 11/05/2011  . Somatic complaints, multiple 08/06/2011  . Systolic CHF, chronic (HCC) 05/09/2011  . CAD 09/06/2010  . Atrial fibrillation (HCC) 09/06/2010  . Sick sinus syndrome (HCC) 09/06/2010    Past Surgical History:  Procedure Laterality Date  . CARDIAC CATHETERIZATION  03/19/2007   EF 45%. THERE IS 2+ MITRAL INSUFFICIENCY. THERE IS MODERATE INFERIOR WALL HYPOKINESIA WITH OVERALL MILD TO MODERATE LV DYSFUNCTION  . CORONARY ARTERY BYPASS GRAFT  04/2008   INCLUDING A MITRAL VALVE REPAIR AND A LEFT SIDED MAZE PROCEDURE  . CORONARY ARTERY BYPASS GRAFT     X2 with LIMA to LAD and SVG to PD and WITH A LEFT SIDED MAZE PROCEDURE & MITRAL VALVE REPAIR. THIS INCLUDED AN LIMA GRAFT TO THE LAD, AND A SAPHENOUS VEIN GRAFT TO THE PDA.  Marland Kitchen CORONARY STENT PLACEMENT  1996, AND 03/2007   RIGHT CORONARY  . MITRAL VALVE REPAIR    . PACEMAKER INSERTION  05/19/2008   MDT EnRhythm implanted for mobitz II AV block by Dr Reyes Ivan  .  REMOVAL OF COLON POLYP     AND A LARGE CECAL POLYP  . TRANSTHORACIC ECHOCARDIOGRAM  06/21/2008   EF 35%    OB History    No data available       Home Medications    Prior to Admission medications   Medication Sig Start Date End Date Taking? Authorizing Provider  carboxymethylcellulose (REFRESH PLUS) 0.5 % SOLN Place 1 drop into both eyes 3 (three) times daily as needed (d).   Yes Historical Provider, MD  Cholecalciferol (VITAMIN D) 2000 UNITS tablet Take 2,000 Units by mouth daily.     Yes Historical Provider, MD  dofetilide (TIKOSYN) 250 MCG capsule TAKE 1 CAPSULE BY MOUTH TWICE A DAY 08/15/16  Yes Hillis RangeJames  Allred, MD  furosemide (LASIX) 20 MG tablet Take 1 tablet (20 mg total) by mouth daily. 05/18/15  Yes Rosalio MacadamiaLori C Gerhardt, NP  metoprolol tartrate (LOPRESSOR) 25 MG tablet TAKE 0.5 TABLETS (12.5 MG TOTAL) BY MOUTH 2 (TWO) TIMES DAILY. 06/21/16  Yes Rosalio MacadamiaLori C Gerhardt, NP  OVER THE COUNTER MEDICATION Place 1 drop into both eyes daily as needed (for dry eyes). Eye Drops   Yes Historical Provider, MD  pantoprazole (PROTONIX) 40 MG tablet TAKE 1 TABLET (40 MG TOTAL) BY MOUTH DAILY. 11/29/15  Yes Hillis RangeJames Allred, MD  potassium chloride SA (K-DUR,KLOR-CON) 20 MEQ tablet Take 1 tablet (20 mEq total) by mouth 2 (two) times daily. 11/21/15  Yes Hillis RangeJames Allred, MD  prednisoLONE acetate (PRED FORTE) 1 % ophthalmic suspension Place 1 drop into the right eye every morning. 07/04/16  Yes Historical Provider, MD  valACYclovir (VALTREX) 1000 MG tablet Take 1 g by mouth daily. 07/16/16  Yes Historical Provider, MD    Family History Family History  Problem Relation Age of Onset  . Heart attack Mother   . Cancer Father     Social History Social History  Substance Use Topics  . Smoking status: Former Smoker    Packs/day: 0.50    Years: 60.00    Types: Cigarettes  . Smokeless tobacco: Never Used  . Alcohol use No     Allergies   Amoxicillin; Amiodarone; Atorvastatin; Diphenhydramine hcl; and Rosuvastatin   Review of Systems Review of Systems  Constitutional: Negative for chills and fever.  HENT: Negative for facial swelling and sore throat.   Respiratory: Positive for cough and shortness of breath.   Cardiovascular: Negative for chest pain.  Gastrointestinal: Negative for abdominal pain, nausea and vomiting.  Genitourinary: Negative for dysuria.  Musculoskeletal: Negative for back pain.  Skin: Negative for rash and wound.  Neurological: Negative for headaches.  Psychiatric/Behavioral: The patient is not nervous/anxious.      Physical Exam Updated Vital Signs BP 145/77 (BP Location: Right Arm)   Pulse 95    Temp 97.9 F (36.6 C) (Oral)   Resp 25   Ht 5\' 5"  (1.651 m)   Wt 66.7 kg   SpO2 94%   BMI 24.46 kg/m   Physical Exam  Constitutional: She appears well-developed and well-nourished. No distress.  HENT:  Head: Normocephalic and atraumatic.  Mouth/Throat: Oropharynx is clear and moist. No oropharyngeal exudate.  Eyes: Conjunctivae are normal. Pupils are equal, round, and reactive to light. Right eye exhibits no discharge. Left eye exhibits no discharge. No scleral icterus.  Neck: Normal range of motion. Neck supple. No thyromegaly present.  Cardiovascular: Normal rate, regular rhythm, normal heart sounds and intact distal pulses.  Exam reveals no gallop and no friction rub.   No murmur heard. Pulmonary/Chest:  Effort normal. No stridor. No respiratory distress. She has no wheezes. She has rhonchi (bilateral bases). She has no rales.  Abdominal: Soft. Bowel sounds are normal. She exhibits no distension. There is no tenderness. There is no rebound and no guarding.  Musculoskeletal: She exhibits no edema.  Lymphadenopathy:    She has no cervical adenopathy.  Neurological: She is alert. Coordination normal.  Skin: Skin is warm and dry. No rash noted. She is not diaphoretic. No pallor.  Psychiatric: She has a normal mood and affect.  Nursing note and vitals reviewed.    ED Treatments / Results  Labs (all labs ordered are listed, but only abnormal results are displayed) Labs Reviewed  CBC WITH DIFFERENTIAL/PLATELET - Abnormal; Notable for the following:       Result Value   WBC 13.9 (*)    RDW 16.1 (*)    Neutro Abs 12.2 (*)    All other components within normal limits  BASIC METABOLIC PANEL - Abnormal; Notable for the following:    CO2 21 (*)    Glucose, Bld 114 (*)    All other components within normal limits  BRAIN NATRIURETIC PEPTIDE - Abnormal; Notable for the following:    B Natriuretic Peptide 517.4 (*)    All other components within normal limits  I-STAT TROPOININ, ED -  Abnormal; Notable for the following:    Troponin i, poc 0.12 (*)    All other components within normal limits  PROTIME-INR  PROCALCITONIN    EKG  EKG Interpretation  Date/Time:  Thursday August 16 2016 09:40:32 EST Ventricular Rate:  99 PR Interval:    QRS Duration: 153 QT Interval:  387 QTC Calculation: 497 R Axis:   85 Text Interpretation:  Sinus tachycardia Multiform ventricular premature complexes Anterior infarct, old intermittently paced similar to last EKG  Confirmed by LIU MD, DANA [phone]([id number]) on 08/16/2016 11:16:46 AM       Radiology Dg Chest 2 View  Result Date: 08/16/2016 CLINICAL DATA:  Cough and shortness of breath since earlier today. History of pacemaker placement, coronary artery disease, CHF, valvular heart disease. Former smoker. EXAM: CHEST  2 VIEW COMPARISON:  PA and lateral chest x-ray of April 09, 2014 FINDINGS: The lungs remain hyperinflated. The interstitial markings are increased bilaterally. Patchy increased density is present at both lung bases. The heart is top-normal in size. The pulmonary vascularity is mildly prominent centrally. There are post CABG and mitral valve replacement changes. The ICD is in stable position. There is calcification in the wall of the aortic arch. IMPRESSION: COPD. Bibasilar atelectasis or early pneumonia. Low-grade compensated CHF. Followup PA and lateral chest X-ray is recommended in 3-4 weeks following trial of antibiotic therapy to ensure resolution and exclude underlying malignancy. Aortic atherosclerosis. Electronically Signed   By: David  Swazil[place]Jordan M.D.   On: 08/16/2016 11:05    Procedures Procedures (including critical care time)  Medications Ordered in ED Medications  cefTRIAXone (ROCEPHIN) 1 g in dextrose 5 % 50 mL IVPB (0 g Intravenous Stopped 08/16/16 1226)  azithromycin (ZITHROMAX) 500 mg in dextrose 5 % 250 mL IVPB (500 mg Intravenous New Bag/Given 08/16/16 1243)  furosemide (LASIX) injection 40 mg (40 mg Intravenous Given  08/16/16 1151)     Initial Impression / Assessment and Plan / ED Course  I have reviewed the triage vital signs and the nursing notes.  Pertinent labs & imaging results that were available during my care of the patient were reviewed by me and considered in my medical decision  making (see chart for details).  Clinical Course     Patient with pneumonia versus fluid overload. CBC shows WBC 13.9. CMP shows CO2 21, glucose 114. BNP shows 517.4. Troponin 0.12. PT-INR, 13.1, 0.99 respectively. CXR shows COPD; bibasilar atelectasis or early pneumonia; low-grade compensated CHF; follow-up PA and lateral chest x-rays recommended in 3-4 weeks following trial of antibiotic therapy to ensure resolution and exclude underlying malignancy. EKG shows sinus tachycardia, multiform PVCs, paced rhythm; similar to past. Patient denying chest pain, troponin elevation likely due to heart strain. Internal medicine resident agrees and agrees to admit the patient for further evaluation and treatment. Rocephin and azithromycin initiated in the ED, as well as 40 mg Lasix IV. Patient also evaluated by Dr. Verdie Mosher who guided the patient's management and agrees with plan.  Final Clinical Impressions(s) / ED Diagnoses   Final diagnoses:  SOB (shortness of breath)  Cough    New Prescriptions New Prescriptions   No medications on file     Emi Holes, PA-C 08/16/16 1357    Lavera Guise, MD 08/16/16 [phone]

## 2016-08-16 NOTE — ED Notes (Signed)
Attempted to call report to 3E 

## 2016-08-16 NOTE — Progress Notes (Signed)
Before giving night time Tikosyn to the patient , 12 lead EKG done, QTc was 496. Double checked with CCMD, will continue to monitor

## 2016-08-17 ENCOUNTER — Other Ambulatory Visit (HOSPITAL_COMMUNITY): Payer: Medicare Other

## 2016-08-17 DIAGNOSIS — J441 Chronic obstructive pulmonary disease with (acute) exacerbation: Secondary | ICD-10-CM | POA: Diagnosis not present

## 2016-08-17 LAB — CBC
HCT: 41.3 % (ref 36.0–46.0)
HEMOGLOBIN: 13.4 g/dL (ref 12.0–15.0)
MCH: 29.3 pg (ref 26.0–34.0)
MCHC: 32.4 g/dL (ref 30.0–36.0)
MCV: 90.4 fL (ref 78.0–100.0)
Platelets: 200 10*3/uL (ref 150–400)
RBC: 4.57 MIL/uL (ref 3.87–5.11)
RDW: 16.4 % — ABNORMAL HIGH (ref 11.5–15.5)
WBC: 7.1 10*3/uL (ref 4.0–10.5)

## 2016-08-17 LAB — BASIC METABOLIC PANEL
ANION GAP: 8 (ref 5–15)
BUN: 15 mg/dL (ref 6–20)
CHLORIDE: 105 mmol/L (ref 101–111)
CO2: 26 mmol/L (ref 22–32)
Calcium: 8.8 mg/dL — ABNORMAL LOW (ref 8.9–10.3)
Creatinine, Ser: 0.97 mg/dL (ref 0.44–1.00)
GFR calc non Af Amer: 52 mL/min — ABNORMAL LOW (ref >60)
Glucose, Bld: 94 mg/dL (ref 65–99)
Potassium: 3.6 mmol/L (ref 3.5–5.1)
Sodium: 139 mmol/L (ref 135–145)

## 2016-08-17 LAB — RESPIRATORY PANEL BY PCR
ADENOVIRUS-RVPPCR: NOT DETECTED
Bordetella pertussis: NOT DETECTED
CHLAMYDOPHILA PNEUMONIAE-RVPPCR: NOT DETECTED
CORONAVIRUS HKU1-RVPPCR: NOT DETECTED
CORONAVIRUS NL63-RVPPCR: NOT DETECTED
Coronavirus 229E: NOT DETECTED
Coronavirus OC43: NOT DETECTED
Influenza A: NOT DETECTED
Influenza B: NOT DETECTED
Metapneumovirus: NOT DETECTED
Mycoplasma pneumoniae: NOT DETECTED
PARAINFLUENZA VIRUS 1-RVPPCR: NOT DETECTED
PARAINFLUENZA VIRUS 3-RVPPCR: NOT DETECTED
Parainfluenza Virus 2: NOT DETECTED
Parainfluenza Virus 4: NOT DETECTED
RHINOVIRUS / ENTEROVIRUS - RVPPCR: NOT DETECTED
Respiratory Syncytial Virus: NOT DETECTED

## 2016-08-17 LAB — TROPONIN I
Troponin I: 0.19 ng/mL (ref <0.03)
Troponin I: 0.13 ng/mL (ref <0.03)

## 2016-08-17 MED ORDER — AZITHROMYCIN 250 MG PO TABS: 250.0000 mg | ORAL_TABLET | Freq: Every day | ORAL | 0 refills | Status: AC

## 2016-08-17 MED ORDER — PREDNISONE 20 MG PO TABS: 40.0000 mg | ORAL_TABLET | Freq: Every day | ORAL | 0 refills | Status: AC

## 2016-08-17 MED ORDER — FUROSEMIDE 20 MG PO TABS: 40.0000 mg | ORAL_TABLET | Freq: Every day | ORAL | 3 refills | Status: DC

## 2016-08-17 NOTE — Progress Notes (Signed)
  Date: 08/17/2016  Patient name: [name]  Medical record number: [id number]  Date of birth: 12/23/1929   This patient's plan of care was discussed with the house staff. Please see their note for complete details. I concur with their findings. Pt feels well and liek she is back to her baseline. She would like to be d/c home.  Blood pressure (!) 111/53, pulse 77, temperature 97.8 F (36.6 C), temperature source Oral, resp. rate 18, height 5\' 5"  (1.651 m), weight 69.7 kg (153 lb 11.2 oz), SpO2 93 %. CV- rrr Chest- mild bibasilar crackles.  abd- bs+, soft non-tender.   Labs:  Troponin; 0.13/0.19/0.15 Procalcitonin < 0.10  A/P  COPD exacerbation She is much better.  Will complete her steroids and anbx at home.  Can f/u her respiratory virus panel.  Plan for d/c today.   Ginnie SmartJeffrey C Anju Sereno, MD 08/17/2016, 9:27 AM

## 2016-08-17 NOTE — Progress Notes (Signed)
SATURATION QUALIFICATIONS: (This note is used to comply with regulatory documentation for home oxygen)  Patient Saturations on Room Air at Rest = 94%  Patient Saturations on Room Air while Ambulating = 93%  Patient Saturations on 0 Liters of oxygen while Ambulating = 93%  Please briefly explain why patient needs home oxygen:none needed tolerated walking with no distress.

## 2016-08-17 NOTE — Progress Notes (Signed)
   Subjective: Feeling at her baseline, with no dyspnea, improved cough, and no chest pain.  Wants to go home.  Objective:  Vital signs in last 24 hours: Vitals:   08/17/16 0915 08/17/16 0957 08/17/16 1000 08/17/16 1026  BP: (!) 111/53   (!) 122/47  Pulse: 77   74  Resp: 18     Temp: 97.8 F (36.6 C)     TempSrc: Oral     SpO2: 93% 95% 93%   Weight:      Height:       O2 sat 93% on RA while ambulating  Physical Exam  Constitutional: She is oriented to person, place, and time. She appears well-developed and well-nourished. No distress.  Cardiovascular: Normal rate and regular rhythm.   Pulmonary/Chest:  Mild bibasilar crackles No wheezes No increased work of breathing Good air movement  Musculoskeletal: She exhibits no edema or tenderness.  Neurological: She is alert and oriented to person, place, and time.  Psychiatric: She has a normal mood and affect. Her behavior is normal.   CBC Latest Ref Rng & Units 08/17/2016 08/16/2016 05/18/2015  WBC 4.0 - 10.5 K/uL 7.1 13.9(H) 8.8  Hemoglobin 12.0 - 15.0 g/dL 40.913.4 81.114.4 91.413.9  Hematocrit 36.0 - 46.0 % 41.3 44.4 42.3  Platelets 150 - 400 K/uL 200 214 267.0   BMP Latest Ref Rng & Units 08/17/2016 08/16/2016 05/16/2016  Glucose 65 - 99 mg/dL 94 782(N114(H) 85  BUN 6 - 20 mg/dL 15 11 16   Creatinine 0.44 - 1.00 mg/dL 5.620.97 1.300.83 8.650.87  Sodium 135 - 145 mmol/L 139 139 145  Potassium 3.5 - 5.1 mmol/L 3.6 4.2 4.7  Chloride 101 - 111 mmol/L 105 110 106  CO2 22 - 32 mmol/L 26 21(L) 26  Calcium 8.9 - 10.3 mg/dL 7.8(I8.8(L) 8.9 9.6    Cardiac Panel (last 3 results)  Recent Labs  08/16/16 1644 08/16/16 2253 08/17/16 0822  TROPONINI 0.15* 0.19* 0.13*     Assessment/Plan:  Active Problems:   Shortness of breath  #COPD Exacerbation She does not carry a formal diagnosis of COPD, but has a 30+ pack year smoking history and hyperinflated lungs on CXR consistent with COPD.  Increased dyspnea, sputum volume, and sputum purulence consistent with  moderate COPD exacerbation.  Today is maintainig O2 sats in mid 90s on RA even with ambulation and feels dyspnea improved. -Duonebs -Prednisone PO 40mg  x5 days (last 11/13) -Finish 5 days antibiotics with azithromycin 250 mg daily (last 11/13) -Recommend outpatient PFTs and CXR  #CHF Exacerbation History of CHF, BNP 500s, some mild crackles on exam, though she otherwise seems euvolemic with no peripheral edema, JVD, or pulmonary edema on CXR.   -Increase Lasix 40 mg PO daily (from home dose 20 mg daily)  #Troponinemia Slightly elevated troponins with flat trend, no chest pain, nonischemic EKG.  Likely demand secondary to tachycardia, volume overload or COPD exacerbation.  Dispo: Anticipated discharge today.  Alm BustardMatthew O'Sullivan, MD 08/17/2016, 1:34 PM Pager: [phone]

## 2016-08-20 ENCOUNTER — Telehealth: Payer: Self-pay | Admitting: Cardiology

## 2016-08-20 ENCOUNTER — Encounter: Payer: Medicare Other | Admitting: *Deleted

## 2016-08-20 LAB — CUP PACEART REMOTE DEVICE CHECK
Battery Voltage: 2.85 V
Brady Statistic AP VP Percent: 0.01 %
Brady Statistic RA Percent Paced: 5.06 %
Brady Statistic RV Percent Paced: 0.04 %
Implantable Lead Implant Date: 20090812
Implantable Lead Location: 753860
Implantable Lead Model: 5076
Implantable Lead Model: 5076
Implantable Pulse Generator Implant Date: 20090812
Lead Channel Setting Pacing Amplitude: 4 V
Lead Channel Setting Pacing Pulse Width: 0.4 ms
Lead Channel Setting Sensing Sensitivity: 0.9 mV
MDC IDC LEAD IMPLANT DT: 20090812
MDC IDC LEAD LOCATION: 753859
MDC IDC MSMT LEADCHNL RA IMPEDANCE VALUE: 368 Ohm
MDC IDC MSMT LEADCHNL RA SENSING INTR AMPL: 1.144 mV
MDC IDC MSMT LEADCHNL RV IMPEDANCE VALUE: 488 Ohm
MDC IDC MSMT LEADCHNL RV SENSING INTR AMPL: 20.266 mV
MDC IDC SESS DTM: 20171010222801
MDC IDC SET LEADCHNL RV PACING AMPLITUDE: 2 V
MDC IDC STAT BRADY AP VS PERCENT: 5.05 %
MDC IDC STAT BRADY AS VP PERCENT: 0.04 %
MDC IDC STAT BRADY AS VS PERCENT: 94.9 %

## 2016-08-20 NOTE — Telephone Encounter (Signed)
Spoke with pt and reminded pt of remote transmission that is due today. Pt verbalized understanding.   

## 2016-08-20 NOTE — Progress Notes (Signed)
Normal remote reviewed. Battery check only   [facility] 08/20/16

## 2016-08-24 ENCOUNTER — Encounter: Payer: Self-pay | Admitting: Cardiology

## 2016-09-03 ENCOUNTER — Telehealth: Payer: Self-pay | Admitting: Cardiology

## 2016-09-03 ENCOUNTER — Ambulatory Visit (INDEPENDENT_AMBULATORY_CARE_PROVIDER_SITE_OTHER): Payer: Medicare Other | Admitting: *Deleted

## 2016-09-03 DIAGNOSIS — I495 Sick sinus syndrome: Secondary | ICD-10-CM | POA: Diagnosis not present

## 2016-09-03 NOTE — Telephone Encounter (Signed)
Spoke w/ pt and informed her that her device has reached ERI. Informed her that this means she has about 3 months left remaining on her battery life. Informed her that we will get her in to see a provider weather it by MD / PA / NP to discuss procedure and schedule the procedure for another day. Pt verbalized understanding.

## 2016-09-04 NOTE — Progress Notes (Signed)
Remote pacemaker transmission.   

## 2016-09-06 ENCOUNTER — Encounter: Payer: Self-pay | Admitting: Cardiology

## 2016-09-17 ENCOUNTER — Encounter: Payer: Self-pay | Admitting: Internal Medicine

## 2016-09-17 ENCOUNTER — Ambulatory Visit (INDEPENDENT_AMBULATORY_CARE_PROVIDER_SITE_OTHER): Payer: Medicare Other | Admitting: Internal Medicine

## 2016-09-17 ENCOUNTER — Telehealth: Payer: Self-pay

## 2016-09-17 VITALS — BP 102/62 | HR 58 | Ht 65.0 in | Wt 157.2 lb

## 2016-09-17 DIAGNOSIS — I495 Sick sinus syndrome: Secondary | ICD-10-CM | POA: Diagnosis not present

## 2016-09-17 DIAGNOSIS — I480 Paroxysmal atrial fibrillation: Secondary | ICD-10-CM | POA: Diagnosis not present

## 2016-09-17 DIAGNOSIS — Z950 Presence of cardiac pacemaker: Secondary | ICD-10-CM

## 2016-09-17 DIAGNOSIS — Z01812 Encounter for preprocedural laboratory examination: Secondary | ICD-10-CM | POA: Diagnosis not present

## 2016-09-17 LAB — CUP PACEART INCLINIC DEVICE CHECK
Brady Statistic AP VP Percent: 0 %
Brady Statistic AS VS Percent: 91.62 %
Brady Statistic RA Percent Paced: 4.02 %
Brady Statistic RV Percent Paced: 4.39 %
Implantable Lead Location: 753860
Implantable Lead Model: 5076
Implantable Lead Model: 5076
Lead Channel Impedance Value: 520 Ohm
Lead Channel Pacing Threshold Amplitude: 3 V
Lead Channel Sensing Intrinsic Amplitude: 1.1 mV
Lead Channel Sensing Intrinsic Amplitude: 20.266 mV
Lead Channel Setting Pacing Pulse Width: 0.4 ms
MDC IDC LEAD IMPLANT DT: 20090812
MDC IDC LEAD IMPLANT DT: 20090812
MDC IDC LEAD LOCATION: 753859
MDC IDC MSMT BATTERY VOLTAGE: 2.75 V
MDC IDC MSMT LEADCHNL RA IMPEDANCE VALUE: 384 Ohm
MDC IDC MSMT LEADCHNL RA PACING THRESHOLD PULSEWIDTH: 1 ms
MDC IDC MSMT LEADCHNL RV PACING THRESHOLD AMPLITUDE: 1 V
MDC IDC MSMT LEADCHNL RV PACING THRESHOLD PULSEWIDTH: 0.4 ms
MDC IDC PG IMPLANT DT: 20090812
MDC IDC SESS DTM: 20171211184412
MDC IDC SET LEADCHNL RA PACING AMPLITUDE: 4 V
MDC IDC SET LEADCHNL RV PACING AMPLITUDE: 2 V
MDC IDC SET LEADCHNL RV SENSING SENSITIVITY: 0.9 mV
MDC IDC STAT BRADY AP VS PERCENT: 4.1 %
MDC IDC STAT BRADY AS VP PERCENT: 4.28 %

## 2016-09-17 NOTE — Patient Instructions (Addendum)
Medication Instructions:  Your physician recommends that you continue on your current medications as directed. Please refer to the Current Medication list given to you today.   Labwork: Your physician recommends that you have lab work 10/16/16 on the day of your procedure at the hospital    Testing/Procedures: Your physician has recommended that you have a generator change for your pacemaker. Your procedure is scheduled for 10/16/16 at 1:30 PM. Please reference the instruction sheet given to you today for all the things you will need to do prior to your procedure and on your procedure day   Follow-Up: Your physician recommends that you schedule a follow-up appointment in: 10-14 days post generator change in the device clinic.  Your physician recommends that you schedule a follow-up appointment in: 3 months (91 days) post generator change with Dr. Johney FrameAllred     Any Other Special Instructions Will Be Listed Below (If Applicable).  Please wash with the CHG Soap the night before and morning of procedure (follow instruction page "Preparing For Surgery").   Report to the Main Entrance [facility] on 10/16/16 at 1:30 PM  Nothing to eat or drink after midnight the night before procedure  Do not take any medication morning of procedure  You will need someone to drive you home after the procedure    If you need a refill on your cardiac medications before your next appointment, please call your pharmacy.

## 2016-09-17 NOTE — Progress Notes (Signed)
PCP: Willow OraANDY,[name], MD Primary Cardiologist:  Dr SwazilandJordan  The patient presents today for routine electrophysiology followup.  She has a h/o atrial fibrillation and bradycardia s/p PPM implantation (MDT) implantation 05/19/2008 by Dr Reyes IvanKersey.  She previously had anemia for which she declined workup.  She has declined anticoagulation with her afib.  She has maintained sinus rhythm with tikosyn for several years.  She has reached ERI battery status and presents today for evaluation.  Today, she denies symptoms of palpitations, chest pain, shortness of breath, orthopnea, PND, lower extremity edema, dizziness, presyncope, syncope, or neurologic sequela. The patient is tolerating medications without difficulties and is otherwise without complaint today.    Past Medical History:  Diagnosis Date  . Anxiety   . Atrial fibrillation (HCC)    a. in sinus on Tikosyn and coumadin;  b. s/p MAZE @ time of MVR/CABG 05/2008;  c. h/o pulm toxicity while on amio.  . Chronic anticoagulation    coumadin  . Chronic systolic CHF (congestive heart failure) (HCC)    a. 02/2013 Echo: EF 35% inflat AK, mod to sev calcified MV annulus w/o MS, mod dil LA, PASP 33mmHg.  Marland Kitchen. Coronary artery disease    a. 1996 s/p PS BMS;  b. 03/2007 NSTEMI/PCI: 3.0x24 and 3.0x28 Taxus DES' t RCA; c. 05/2008 CABG x 2 with MV repair and Maze (LIMA->LAD, VG->PDA)  . Depression   . High risk medication use    Tikosyn  . Hyperlipidemia   . Hypertension   . IDA (iron deficiency anemia)   . LBBB (left bundle branch block)   . Mitral insufficiency    a. 05/2008 s/p MV repair with [employer] MV ring (model 5200, 26mm ser # [id number].  . Subarachnoid hemorrhage (HCC)    a. in setting of syncope/fall/digoxin toxicity 11/2007.  Alcide Clever. Tachycardia-bradycardia [facility](HCC)    a. 05/2008 s/p MDT P1501DR Enrhythm DC PPM, ser # [id number].   Past Surgical History:  Procedure Laterality Date  . CARDIAC CATHETERIZATION  03/19/2007   EF 45%. THERE IS 2+ MITRAL  INSUFFICIENCY. THERE IS MODERATE INFERIOR WALL HYPOKINESIA WITH OVERALL MILD TO MODERATE LV DYSFUNCTION  . CORONARY ARTERY BYPASS GRAFT  04/2008   INCLUDING A MITRAL VALVE REPAIR AND A LEFT SIDED MAZE PROCEDURE  . CORONARY ARTERY BYPASS GRAFT     X2 with LIMA to LAD and SVG to PD and WITH A LEFT SIDED MAZE PROCEDURE & MITRAL VALVE REPAIR. THIS INCLUDED AN LIMA GRAFT TO THE LAD, AND A SAPHENOUS VEIN GRAFT TO THE PDA.  Marland Kitchen. CORONARY STENT PLACEMENT  1996, AND 03/2007   RIGHT CORONARY  . MITRAL VALVE REPAIR    . PACEMAKER INSERTION  05/19/2008   MDT EnRhythm implanted for mobitz II AV block by Dr Reyes IvanKersey  . REMOVAL OF COLON POLYP     AND A LARGE CECAL POLYP  . TRANSTHORACIC ECHOCARDIOGRAM  06/21/2008   EF 35%    Current Outpatient Prescriptions  Medication Sig Dispense Refill  . carboxymethylcellulose (REFRESH PLUS) 0.5 % SOLN Place 1 drop into both eyes 3 (three) times daily as needed (d).    . Cholecalciferol (VITAMIN D) 2000 UNITS tablet Take 2,000 Units by mouth daily.      Marland Kitchen. dofetilide (TIKOSYN) 250 MCG capsule TAKE 1 CAPSULE BY MOUTH TWICE A DAY 180 capsule 2  . furosemide (LASIX) 20 MG tablet Take 2 tablets (40 mg total) by mouth daily. 90 tablet 3  . metoprolol tartrate (LOPRESSOR) 25 MG tablet TAKE 0.5 TABLETS (12.5 MG TOTAL)  BY MOUTH 2 (TWO) TIMES DAILY. 90 tablet 2  . OVER THE COUNTER MEDICATION Place 1 drop into both eyes daily as needed (for dry eyes). Eye Drops    . pantoprazole (PROTONIX) 40 MG tablet TAKE 1 TABLET (40 MG TOTAL) BY MOUTH DAILY. 90 tablet 3  . potassium chloride SA (K-DUR,KLOR-CON) 20 MEQ tablet Take 1 tablet (20 mEq total) by mouth 2 (two) times daily. 60 tablet 11   No current facility-administered medications for this visit.     Allergies  Allergen Reactions  . Amoxicillin Other (See Comments)    REACTION: "made flush...cheeks red"  . Amiodarone     unknown  . Atorvastatin     Myalgia  . Diphenhydramine Hcl Other (See Comments)    unknown  . Rosuvastatin      myalgia    Social History   Social History  . Marital status: Married    Spouse name: N/A  . Number of children: N/A  . Years of education: N/A   Occupational History  . Not on file.   Social History Main Topics  . Smoking status: Former Smoker    Packs/day: 0.50    Years: 60.00    Types: Cigarettes  . Smokeless tobacco: Never Used  . Alcohol use No  . Drug use: No  . Sexual activity: No   Other Topics Concern  . Not on file   Social History Narrative  . No narrative on file    Family History  Problem Relation Age of Onset  . Heart attack Mother   . Cancer Father    ROS-all systems are reviewed and negative except as per HPI above  Physical Exam: Vitals:   09/17/16 1225  BP: 102/62  Pulse: (!) 58  SpO2: 96%  Weight: 157 lb 3.2 oz (71.3 kg)  Height: 5\' 5"  (1.651 m)    GEN- The patient is well appearing, alert and oriented x 3 today.   Head- NCAT Eyes-  Sclera clear, conjunctiva pink Ears- hearing intact Oropharynx- clear Neck- supple Lungs- Clear to ausculation bilaterally, normal work of breathing Chest- pacemaker pocket is well healed Heart- Regular rate and rhythm, no murmurs, rubs or gallops, PMI not laterally displaced GI- soft, NT, ND, + BS Extremities- no clubbing, cyanosis, or edema Psych- depressed/ flat affect  Pacemaker interrogation- reviewed in detail today,  See PACEART report ekg today reveals sinus rhythm with LBBB, intermittent VA dissociation with V pacing (VVI reverted due to ERI)  Assessment and Plan:  1. Sick sinus syndrome She has reached ERI. I have reprogrammed from VVI back to DDD today. Atrial output is chronically elevated however she rarely paces.  Given advanced age, would avoid lead revision. Risks of generator change discussed at length with the patient and her son.  They wish for a conservative approach but will proceed with gen change after the first of the year (per their preference). Would anticipate Adapta  L at time of gen change for battery longevity.  2. afib Well controlled Qt is stable Poor candidate for anticoagulation long term  3. CAD Stable No change required today  4. HTN Stable No change required today  5. Chronic systolic dysfunction Appears euvolemic No changes today Further medicine therapy is limited by blood pressure Would not advise LV lead upgrade given advanced age and patient preferences for conservative management,  She is clear in her preference for conservative approach.  Hillis RangeJames Deontay Ladnier MD, [facility] 09/17/2016 1:01 PM

## 2016-09-21 LAB — CUP PACEART REMOTE DEVICE CHECK
Battery Voltage: 2.78 V
Brady Statistic AP VP Percent: 0 %
Brady Statistic RA Percent Paced: 2.12 %
Date Time Interrogation Session: 20171127165346
Implantable Lead Implant Date: 20090812
Implantable Lead Location: 753860
Implantable Pulse Generator Implant Date: 20090812
Lead Channel Impedance Value: 496 Ohm
Lead Channel Setting Pacing Pulse Width: 0.4 ms
Lead Channel Setting Sensing Sensitivity: 0.9 mV
MDC IDC LEAD IMPLANT DT: 20090812
MDC IDC LEAD LOCATION: 753859
MDC IDC MSMT LEADCHNL RA IMPEDANCE VALUE: 376 Ohm
MDC IDC MSMT LEADCHNL RA SENSING INTR AMPL: 1.056 mV
MDC IDC MSMT LEADCHNL RV SENSING INTR AMPL: 20.266 mV
MDC IDC SET LEADCHNL RV PACING AMPLITUDE: 2 V
MDC IDC STAT BRADY AP VS PERCENT: 2.12 %
MDC IDC STAT BRADY AS VP PERCENT: 8.3 %
MDC IDC STAT BRADY AS VS PERCENT: 89.58 %
MDC IDC STAT BRADY RV PERCENT PACED: 8.3 %

## 2016-10-02 NOTE — Telephone Encounter (Signed)
error 

## 2016-10-10 ENCOUNTER — Telehealth: Payer: Self-pay | Admitting: Internal Medicine

## 2016-10-10 NOTE — Telephone Encounter (Signed)
New Message  Pts son voiced she is suppose to have batteries changed next Tuesday and wants to postpone due to pt is sick.  Please f/u with pt

## 2016-10-11 NOTE — Telephone Encounter (Signed)
Spoke with patient's son and he is going to call me back on Mon to let me know how she is feeling.  I have encouraged to keep as scheduled for now as she does not have fever and is just not feeling well.

## 2016-10-11 NOTE — Telephone Encounter (Signed)
Left a message for son letting him know the importance of having generator on her device changed as it went ERI in Oct and after 3 months we can not guarantee the device will function.  I also let him know I would discuss with his Mom.  When I spoke with patient she says she is good to go for Tuesday but can not drive.  So I told her I would try her son back and see if we could go ahead with change out as scheduled. I have explained to both the patient and her son the importance of having this done ASAP as the function of her device is uncertain 3 months after it reaches ERI.

## 2016-10-11 NOTE — Telephone Encounter (Signed)
°  Follow Up  Son states he has some questions regarding upcoming cath procedure for pt. Please call.

## 2016-10-11 NOTE — Telephone Encounter (Signed)
Spoke with son again and he says it has never been a problem getting her to the hospital on Tues.  He says his mom does not want to have something done that is going to make her live 10 more years.  She is going to speak to MD about it at the hospital

## 2016-10-11 NOTE — Telephone Encounter (Signed)
Follow up  Pts son voiced he wants to cancel procedure on Tuesday, Britta MccreedyBarbara the pt wants to hold off until the spring.  Please f/u with pts son if needed

## 2016-10-16 ENCOUNTER — Ambulatory Visit (HOSPITAL_COMMUNITY)
Admission: RE | Admit: 2016-10-16 | Discharge: 2016-10-16 | Disposition: A | Discharge status: Home / Self Care | Payer: Medicare Other | Source: Ambulatory Visit | Attending: Internal Medicine | Admitting: Internal Medicine

## 2016-10-16 ENCOUNTER — Encounter (HOSPITAL_COMMUNITY)
Admission: RE | Disposition: A | Discharge status: Home / Self Care | Payer: Self-pay | Source: Ambulatory Visit | Attending: Internal Medicine

## 2016-10-16 ENCOUNTER — Encounter (HOSPITAL_COMMUNITY): Payer: Self-pay | Admitting: *Deleted

## 2016-10-16 DIAGNOSIS — F419 Anxiety disorder, unspecified: Secondary | ICD-10-CM | POA: Diagnosis not present

## 2016-10-16 DIAGNOSIS — Z880 Allergy status to penicillin: Secondary | ICD-10-CM | POA: Diagnosis not present

## 2016-10-16 DIAGNOSIS — Z951 Presence of aortocoronary bypass graft: Secondary | ICD-10-CM | POA: Insufficient documentation

## 2016-10-16 DIAGNOSIS — E785 Hyperlipidemia, unspecified: Secondary | ICD-10-CM | POA: Diagnosis not present

## 2016-10-16 DIAGNOSIS — Z950 Presence of cardiac pacemaker: Secondary | ICD-10-CM | POA: Insufficient documentation

## 2016-10-16 DIAGNOSIS — F329 Major depressive disorder, single episode, unspecified: Secondary | ICD-10-CM | POA: Diagnosis not present

## 2016-10-16 DIAGNOSIS — I2510 Atherosclerotic heart disease of native coronary artery without angina pectoris: Secondary | ICD-10-CM | POA: Insufficient documentation

## 2016-10-16 DIAGNOSIS — Z79899 Other long term (current) drug therapy: Secondary | ICD-10-CM | POA: Diagnosis not present

## 2016-10-16 DIAGNOSIS — I5022 Chronic systolic (congestive) heart failure: Secondary | ICD-10-CM | POA: Diagnosis not present

## 2016-10-16 DIAGNOSIS — Z87891 Personal history of nicotine dependence: Secondary | ICD-10-CM | POA: Insufficient documentation

## 2016-10-16 DIAGNOSIS — I252 Old myocardial infarction: Secondary | ICD-10-CM | POA: Diagnosis not present

## 2016-10-16 DIAGNOSIS — I4891 Unspecified atrial fibrillation: Secondary | ICD-10-CM | POA: Insufficient documentation

## 2016-10-16 DIAGNOSIS — I110 Hypertensive heart disease with heart failure: Secondary | ICD-10-CM | POA: Insufficient documentation

## 2016-10-16 DIAGNOSIS — Z45010 Encounter for checking and testing of cardiac pacemaker pulse generator [battery]: Secondary | ICD-10-CM | POA: Diagnosis not present

## 2016-10-16 DIAGNOSIS — I495 Sick sinus syndrome: Secondary | ICD-10-CM | POA: Insufficient documentation

## 2016-10-16 HISTORY — PX: EP IMPLANTABLE DEVICE: SHX172B

## 2016-10-16 LAB — BASIC METABOLIC PANEL
Anion gap: 11 (ref 5–15)
BUN: 15 mg/dL (ref 6–20)
CHLORIDE: 104 mmol/L (ref 101–111)
CO2: 28 mmol/L (ref 22–32)
CREATININE: 1.03 mg/dL — AB (ref 0.44–1.00)
Calcium: 9.4 mg/dL (ref 8.9–10.3)
GFR calc non Af Amer: 49 mL/min — ABNORMAL LOW (ref >60)
GFR, EST AFRICAN AMERICAN: 56 mL/min — AB (ref >60)
Glucose, Bld: 96 mg/dL (ref 65–99)
Potassium: 3.7 mmol/L (ref 3.5–5.1)
Sodium: 143 mmol/L (ref 135–145)

## 2016-10-16 LAB — CBC
HEMATOCRIT: 44.5 % (ref 36.0–46.0)
HEMOGLOBIN: 15 g/dL (ref 12.0–15.0)
MCH: 30.5 pg (ref 26.0–34.0)
MCHC: 33.7 g/dL (ref 30.0–36.0)
MCV: 90.4 fL (ref 78.0–100.0)
Platelets: 239 10*3/uL (ref 150–400)
RBC: 4.92 MIL/uL (ref 3.87–5.11)
RDW: 14.9 % (ref 11.5–15.5)
WBC: 8.5 10*3/uL (ref 4.0–10.5)

## 2016-10-16 LAB — SURGICAL PCR SCREEN
MRSA, PCR: NEGATIVE
STAPHYLOCOCCUS AUREUS: NEGATIVE

## 2016-10-16 SURGERY — PPM GENERATOR CHANGEOUT

## 2016-10-16 MED ORDER — GENTAMICIN SULFATE 40 MG/ML IJ SOLN
INTRAMUSCULAR | Status: AC
  Filled 2016-10-16: qty 2

## 2016-10-16 MED ORDER — VANCOMYCIN HCL IN DEXTROSE 1-5 GM/200ML-% IV SOLN
INTRAVENOUS | Status: AC
  Filled 2016-10-16: qty 200

## 2016-10-16 MED ORDER — SODIUM CHLORIDE 0.9 % IR SOLN
80.0000 mg | Status: AC
  Administered 2016-10-16: 80 mg

## 2016-10-16 MED ORDER — SODIUM CHLORIDE 0.9% FLUSH: 3.0000 mL | INTRAVENOUS | Status: DC | PRN

## 2016-10-16 MED ORDER — VANCOMYCIN HCL IN DEXTROSE 1-5 GM/200ML-% IV SOLN
1000.0000 mg | INTRAVENOUS | Status: AC
  Administered 2016-10-16: 1000 mg via INTRAVENOUS

## 2016-10-16 MED ORDER — CHLORHEXIDINE GLUCONATE 4 % EX LIQD
60.0000 mL | Freq: Once | CUTANEOUS | Status: DC
  Filled 2016-10-16: qty 60

## 2016-10-16 MED ORDER — ACETAMINOPHEN 325 MG PO TABS: 325.0000 mg | ORAL_TABLET | ORAL | Status: DC | PRN

## 2016-10-16 MED ORDER — SODIUM CHLORIDE 0.9 % IV SOLN: 250.0000 mL | INTRAVENOUS | Status: DC | PRN

## 2016-10-16 MED ORDER — MUPIROCIN 2 % EX OINT
TOPICAL_OINTMENT | CUTANEOUS | Status: AC
  Administered 2016-10-16: 1
  Filled 2016-10-16: qty 22

## 2016-10-16 MED ORDER — ONDANSETRON HCL 4 MG/2ML IJ SOLN: 4.0000 mg | Freq: Four times a day (QID) | INTRAMUSCULAR | Status: DC | PRN

## 2016-10-16 MED ORDER — SODIUM CHLORIDE 0.9 % IV SOLN
INTRAVENOUS | Status: DC
  Administered 2016-10-16: 15:00:00 via INTRAVENOUS

## 2016-10-16 MED ORDER — SODIUM CHLORIDE 0.9% FLUSH: 3.0000 mL | Freq: Two times a day (BID) | INTRAVENOUS | Status: DC

## 2016-10-16 MED ORDER — LIDOCAINE HCL (PF) 1 % IJ SOLN
INTRAMUSCULAR | Status: DC | PRN
  Administered 2016-10-16: 40 mL

## 2016-10-16 SURGICAL SUPPLY — 5 items
CABLE SURGICAL S-101-97-12 (CABLE) ×3 IMPLANT
PACEMAKER ADAPTA DR ADDRL1 (Pacemaker) ×1 IMPLANT
PAD DEFIB LIFELINK (PAD) ×3 IMPLANT
PPM ADAPTA DR ADDRL1 (Pacemaker) ×3 IMPLANT
TRAY PACEMAKER INSERTION (PACKS) ×3 IMPLANT

## 2016-10-16 NOTE — Progress Notes (Signed)
Pts son notified that MRSA screen was negative

## 2016-10-16 NOTE — Interval H&P Note (Signed)
History and Physical Interval Note:  10/16/2016 2:56 PM  [name]  has presented today for surgery, with the diagnosis of eri  The various methods of treatment have been discussed with the patient and family. After consideration of risks, benefits and other options for treatment, the patient has consented to  Procedure(s): PPM Generator Changeout (N/A) as a surgical intervention .  The patient's history has been reviewed, patient examined, no change in status, stable for surgery.  I have reviewed the patient's chart and labs.  Questions were answered to the patient's satisfaction.     Hillis RangeJames Wylma Tatem

## 2016-10-16 NOTE — H&P (View-Only) (Signed)
PCP: Willow OraANDY,[name], MD Primary Cardiologist:  Dr SwazilandJordan  The patient presents today for routine electrophysiology followup.  She has a h/o atrial fibrillation and bradycardia s/p PPM implantation (MDT) implantation 05/19/2008 by Dr Reyes IvanKersey.  She previously had anemia for which she declined workup.  She has declined anticoagulation with her afib.  She has maintained sinus rhythm with tikosyn for several years.  She has reached ERI battery status and presents today for evaluation.  Today, she denies symptoms of palpitations, chest pain, shortness of breath, orthopnea, PND, lower extremity edema, dizziness, presyncope, syncope, or neurologic sequela. The patient is tolerating medications without difficulties and is otherwise without complaint today.    Past Medical History:  Diagnosis Date  . Anxiety   . Atrial fibrillation (HCC)    a. in sinus on Tikosyn and coumadin;  b. s/p MAZE @ time of MVR/CABG 05/2008;  c. h/o pulm toxicity while on amio.  . Chronic anticoagulation    coumadin  . Chronic systolic CHF (congestive heart failure) (HCC)    a. 02/2013 Echo: EF 35% inflat AK, mod to sev calcified MV annulus w/o MS, mod dil LA, PASP 33mmHg.  Marland Kitchen. Coronary artery disease    a. 1996 s/p PS BMS;  b. 03/2007 NSTEMI/PCI: 3.0x24 and 3.0x28 Taxus DES' t RCA; c. 05/2008 CABG x 2 with MV repair and Maze (LIMA->LAD, VG->PDA)  . Depression   . High risk medication use    Tikosyn  . Hyperlipidemia   . Hypertension   . IDA (iron deficiency anemia)   . LBBB (left bundle branch block)   . Mitral insufficiency    a. 05/2008 s/p MV repair with [employer] MV ring (model 5200, 26mm ser # [id number].  . Subarachnoid hemorrhage (HCC)    a. in setting of syncope/fall/digoxin toxicity 11/2007.  Alcide Clever. Tachycardia-bradycardia [facility](HCC)    a. 05/2008 s/p MDT P1501DR Enrhythm DC PPM, ser # [id number].   Past Surgical History:  Procedure Laterality Date  . CARDIAC CATHETERIZATION  03/19/2007   EF 45%. THERE IS 2+ MITRAL  INSUFFICIENCY. THERE IS MODERATE INFERIOR WALL HYPOKINESIA WITH OVERALL MILD TO MODERATE LV DYSFUNCTION  . CORONARY ARTERY BYPASS GRAFT  04/2008   INCLUDING A MITRAL VALVE REPAIR AND A LEFT SIDED MAZE PROCEDURE  . CORONARY ARTERY BYPASS GRAFT     X2 with LIMA to LAD and SVG to PD and WITH A LEFT SIDED MAZE PROCEDURE & MITRAL VALVE REPAIR. THIS INCLUDED AN LIMA GRAFT TO THE LAD, AND A SAPHENOUS VEIN GRAFT TO THE PDA.  Marland Kitchen. CORONARY STENT PLACEMENT  1996, AND 03/2007   RIGHT CORONARY  . MITRAL VALVE REPAIR    . PACEMAKER INSERTION  05/19/2008   MDT EnRhythm implanted for mobitz II AV block by Dr Reyes IvanKersey  . REMOVAL OF COLON POLYP     AND A LARGE CECAL POLYP  . TRANSTHORACIC ECHOCARDIOGRAM  06/21/2008   EF 35%    Current Outpatient Prescriptions  Medication Sig Dispense Refill  . carboxymethylcellulose (REFRESH PLUS) 0.5 % SOLN Place 1 drop into both eyes 3 (three) times daily as needed (d).    . Cholecalciferol (VITAMIN D) 2000 UNITS tablet Take 2,000 Units by mouth daily.      Marland Kitchen. dofetilide (TIKOSYN) 250 MCG capsule TAKE 1 CAPSULE BY MOUTH TWICE A DAY 180 capsule 2  . furosemide (LASIX) 20 MG tablet Take 2 tablets (40 mg total) by mouth daily. 90 tablet 3  . metoprolol tartrate (LOPRESSOR) 25 MG tablet TAKE 0.5 TABLETS (12.5 MG TOTAL)  BY MOUTH 2 (TWO) TIMES DAILY. 90 tablet 2  . OVER THE COUNTER MEDICATION Place 1 drop into both eyes daily as needed (for dry eyes). Eye Drops    . pantoprazole (PROTONIX) 40 MG tablet TAKE 1 TABLET (40 MG TOTAL) BY MOUTH DAILY. 90 tablet 3  . potassium chloride SA (K-DUR,KLOR-CON) 20 MEQ tablet Take 1 tablet (20 mEq total) by mouth 2 (two) times daily. 60 tablet 11   No current facility-administered medications for this visit.     Allergies  Allergen Reactions  . Amoxicillin Other (See Comments)    REACTION: "made flush...cheeks red"  . Amiodarone     unknown  . Atorvastatin     Myalgia  . Diphenhydramine Hcl Other (See Comments)    unknown  . Rosuvastatin      myalgia    Social History   Social History  . Marital status: Married    Spouse name: N/A  . Number of children: N/A  . Years of education: N/A   Occupational History  . Not on file.   Social History Main Topics  . Smoking status: Former Smoker    Packs/day: 0.50    Years: 60.00    Types: Cigarettes  . Smokeless tobacco: Never Used  . Alcohol use No  . Drug use: No  . Sexual activity: No   Other Topics Concern  . Not on file   Social History Narrative  . No narrative on file    Family History  Problem Relation Age of Onset  . Heart attack Mother   . Cancer Father    ROS-all systems are reviewed and negative except as per HPI above  Physical Exam: Vitals:   09/17/16 1225  BP: 102/62  Pulse: (!) 58  SpO2: 96%  Weight: 157 lb 3.2 oz (71.3 kg)  Height: 5\' 5"  (1.651 m)    GEN- The patient is well appearing, alert and oriented x 3 today.   Head- NCAT Eyes-  Sclera clear, conjunctiva pink Ears- hearing intact Oropharynx- clear Neck- supple Lungs- Clear to ausculation bilaterally, normal work of breathing Chest- pacemaker pocket is well healed Heart- Regular rate and rhythm, no murmurs, rubs or gallops, PMI not laterally displaced GI- soft, NT, ND, + BS Extremities- no clubbing, cyanosis, or edema Psych- depressed/ flat affect  Pacemaker interrogation- reviewed in detail today,  See PACEART report ekg today reveals sinus rhythm with LBBB, intermittent VA dissociation with V pacing (VVI reverted due to ERI)  Assessment and Plan:  1. Sick sinus syndrome She has reached ERI. I have reprogrammed from VVI back to DDD today. Atrial output is chronically elevated however she rarely paces.  Given advanced age, would avoid lead revision. Risks of generator change discussed at length with the patient and her son.  They wish for a conservative approach but will proceed with gen change after the first of the year (per their preference). Would anticipate Adapta  L at time of gen change for battery longevity.  2. afib Well controlled Qt is stable Poor candidate for anticoagulation long term  3. CAD Stable No change required today  4. HTN Stable No change required today  5. Chronic systolic dysfunction Appears euvolemic No changes today Further medicine therapy is limited by blood pressure Would not advise LV lead upgrade given advanced age and patient preferences for conservative management,  She is clear in her preference for conservative approach.  Hillis RangeJames Ellenie Salome MD, [facility] 09/17/2016 1:01 PM

## 2016-10-16 NOTE — Discharge Instructions (Signed)
Pacemaker Battery Change °A pacemaker battery usually lasts 4 to 12 years. Once or twice per year, you will be asked to visit your health care provider to have a full evaluation of your pacemaker. When a battery needs to be replaced, the entire pacemaker is replaced so that you can benefit from new circuitry and any new features that have been added to pacemakers. Most often, this procedure is very simple because the leads are already in place.  °There are many things that affect how long a pacemaker battery will last, including:  °· The age of the pacemaker.   °· The number of leads (1, 2, or 3).   °· The pacemaker work load. If the pacemaker is helping the heart more often, the battery will not last as long as it would if the pacemaker did not need to help the heart.   °· Power (voltage) settings.  °LET YOUR HEALTH CARE PROVIDER KNOW ABOUT:  °· Any allergies you have.   °· All medicines you are taking, including vitamins, herbs, eye drops, creams, and over-the-counter medicines.   °· Previous problems you or members of your family have had with the use of anesthetics.   °· Any blood disorders you have.   °· Previous surgeries you have had, especially since your last pacemaker placement.   °· Medical conditions you have.   °· Possibility of pregnancy, if this applies. °· Symptoms of chest pain, trouble breathing, palpitations, light-headedness, or feelings of an abnormal or irregular heartbeat. °RISKS AND COMPLICATIONS  °Generally, this is a safe procedure. However, as with any procedure, problems can occur and include:  °· Bleeding.   °· Bruising of the skin around where the incision was made.   °· Pain at the incision site.   °· Pulling apart of the skin at the incision site.   °· Infection.   °· Allergic reaction to anesthetics or other medicines used during the procedure.   °People with diabetes may have a temporary increase in their blood sugar after any surgical procedure.  °BEFORE THE PROCEDURE  °· Wash all  of the skin around the area of the chest where the pacemaker is located.   °· Ask your health care provider for help with any medicine adjustments before the pacemaker is replaced.   °· Do not eat or drink anything after midnight on the night before the procedure or as directed by your health care provider. °· Ask your health care provider if you can take a sip of water with any approved medicines the morning of the procedure. °PROCEDURE  °· After giving medicine to numb the skin (local anesthetic), your health care provider will make a cut to reopen the pocket holding the pacemaker.   °· The old pacemaker will be disconnected from its leads.   °· The leads will be tested.   °· If needed, the leads will be replaced. If the leads are functioning properly, the new pacemaker may be connected to the existing leads. °· A heart monitor and the pacemaker programmer will be used to make sure that the new pacemaker is working properly. °· The incision site will then be closed. A dressing will be placed over the pacemaker site. The dressing will be removed 24-48 hours afterward. °AFTER THE PROCEDURE  °· You will be taken to a recovery area after the new pacemaker implant is completed. Your vital signs such as blood pressure, heart rate, breathing, and oxygen levels will be monitored. °· Your health care provider will tell you when you will need to next test your pacemaker or when to return to the office for follow-up   for removal of stitches. This information is not intended to replace advice given to you by your health care provider. Make sure you discuss any questions you have with your health care provider. Document Released: 01/02/2007 Document Revised: 10/15/2014 Document Reviewed: 04/08/2013 Elsevier Interactive Patient Education  2017 [employer]

## 2016-10-17 ENCOUNTER — Encounter (HOSPITAL_COMMUNITY): Payer: Self-pay | Admitting: Internal Medicine

## 2016-10-29 ENCOUNTER — Ambulatory Visit (INDEPENDENT_AMBULATORY_CARE_PROVIDER_SITE_OTHER): Payer: Medicare Other | Admitting: *Deleted

## 2016-10-29 DIAGNOSIS — Z950 Presence of cardiac pacemaker: Secondary | ICD-10-CM

## 2016-10-29 DIAGNOSIS — I495 Sick sinus syndrome: Secondary | ICD-10-CM | POA: Diagnosis not present

## 2016-10-29 LAB — CUP PACEART INCLINIC DEVICE CHECK
Battery Remaining Longevity: 159 mo
Brady Statistic AP VS Percent: 0 %
Brady Statistic AS VS Percent: 97 %
Implantable Lead Implant Date: 20090812
Implantable Lead Location: 753859
Implantable Lead Model: 5076
Implantable Pulse Generator Implant Date: 20180109
Lead Channel Impedance Value: 432 Ohm
Lead Channel Impedance Value: 661 Ohm
Lead Channel Pacing Threshold Pulse Width: 0.4 ms
Lead Channel Pacing Threshold Pulse Width: 1 ms
Lead Channel Sensing Intrinsic Amplitude: 1 mV
Lead Channel Sensing Intrinsic Amplitude: 22.4 mV
Lead Channel Setting Pacing Amplitude: 2.5 V
Lead Channel Setting Pacing Amplitude: 4 V
Lead Channel Setting Sensing Sensitivity: 5.6 mV
MDC IDC LEAD IMPLANT DT: 20090812
MDC IDC LEAD LOCATION: 753860
MDC IDC MSMT BATTERY IMPEDANCE: 100 Ohm
MDC IDC MSMT BATTERY VOLTAGE: 2.8 V
MDC IDC MSMT LEADCHNL RA PACING THRESHOLD AMPLITUDE: 2.25 V
MDC IDC MSMT LEADCHNL RA PACING THRESHOLD AMPLITUDE: 2.5 V
MDC IDC MSMT LEADCHNL RA PACING THRESHOLD PULSEWIDTH: 1 ms
MDC IDC MSMT LEADCHNL RV PACING THRESHOLD AMPLITUDE: 0.75 V
MDC IDC SESS DTM: 20180122120338
MDC IDC SET LEADCHNL RV PACING PULSEWIDTH: 0.4 ms
MDC IDC STAT BRADY AP VP PERCENT: 0 %
MDC IDC STAT BRADY AS VP PERCENT: 2 %

## 2016-10-29 NOTE — Patient Instructions (Signed)
Please bring your new home monitor with you to your appointment with Dr. Johney FrameAllred on 01/21/17.  We will gather the serial number off this monitor and show you how to use it at this appointment.

## 2016-10-29 NOTE — Progress Notes (Signed)
Wound check appointment. Steri-strips removed. Wound without redness or edema. Incision edges approximated, wound well healed. Normal device function. Thresholds, sensing, and impedances consistent with implant measurements. RA threshold remains elevated, 2.5V @ 1.230ms bipolar, 2.25V @ 1.670ms unipolar. Per JA, maintained base rate at 50ppm, RA capture management off, RA output fixed at 4.0V @ 1.100ms (bipolar). RV output at chronic settings, capture management to monitor only. Histogram distribution appropriate for patient and level of activity. 32 mode switches (0.6%)--1:1 SVT and double-counted P-waves per EGMs; reduced RA sensitivity to 0.5235mV, no further oversensing noted. Hx AF +Tikosyn. No high ventricular rates noted. Patient educated about wound care, arm mobility, lifting restrictions. Patient to bring [facility] monitor with her to 3 month f/u appt for education and to link SN in [facility]. ROV with JA on 01/21/17.

## 2016-11-05 ENCOUNTER — Telehealth: Payer: Self-pay

## 2016-11-05 NOTE — Telephone Encounter (Signed)
Prior auth for Dofetilide submitted to [facility].

## 2016-11-06 ENCOUNTER — Telehealth: Payer: Self-pay

## 2016-11-06 NOTE — Telephone Encounter (Signed)
Dofetilide approved by [facility] [employer]. Valid through 11/05/2017.

## 2017-01-21 ENCOUNTER — Encounter: Payer: Medicare Other | Admitting: Internal Medicine

## 2017-01-22 ENCOUNTER — Encounter: Payer: Self-pay | Admitting: Internal Medicine

## 2017-01-23 ENCOUNTER — Encounter: Payer: Self-pay | Admitting: Internal Medicine

## 2017-01-25 ENCOUNTER — Other Ambulatory Visit: Payer: Self-pay | Admitting: Internal Medicine

## 2017-01-28 ENCOUNTER — Encounter: Payer: Self-pay | Admitting: Internal Medicine

## 2017-02-16 ENCOUNTER — Other Ambulatory Visit: Payer: Self-pay | Admitting: Internal Medicine

## 2017-02-25 ENCOUNTER — Ambulatory Visit (INDEPENDENT_AMBULATORY_CARE_PROVIDER_SITE_OTHER): Payer: Medicare Other | Admitting: Internal Medicine

## 2017-02-25 ENCOUNTER — Encounter: Payer: Self-pay | Admitting: Internal Medicine

## 2017-02-25 VITALS — BP 118/76 | HR 73 | Ht 65.0 in | Wt 158.8 lb

## 2017-02-25 DIAGNOSIS — I480 Paroxysmal atrial fibrillation: Secondary | ICD-10-CM

## 2017-02-25 DIAGNOSIS — I10 Essential (primary) hypertension: Secondary | ICD-10-CM | POA: Diagnosis not present

## 2017-02-25 DIAGNOSIS — Z950 Presence of cardiac pacemaker: Secondary | ICD-10-CM

## 2017-02-25 DIAGNOSIS — I495 Sick sinus syndrome: Secondary | ICD-10-CM

## 2017-02-25 LAB — CUP PACEART INCLINIC DEVICE CHECK
Battery Impedance: 100 Ohm
Battery Remaining Longevity: 152 mo
Battery Voltage: 2.8 V
Date Time Interrogation Session: 20180521154342
Implantable Lead Implant Date: 20090812
Implantable Lead Location: 753859
Implantable Lead Model: 5076
Implantable Lead Model: 5076
Implantable Pulse Generator Implant Date: 20180109
Lead Channel Impedance Value: 437 Ohm
Lead Channel Pacing Threshold Amplitude: 0.75 V
Lead Channel Pacing Threshold Amplitude: 2.75 V
Lead Channel Pacing Threshold Pulse Width: 0.4 ms
Lead Channel Pacing Threshold Pulse Width: 1 ms
Lead Channel Sensing Intrinsic Amplitude: 1 mV
Lead Channel Setting Pacing Amplitude: 2.5 V
Lead Channel Setting Pacing Pulse Width: 0.4 ms
MDC IDC LEAD IMPLANT DT: 20090812
MDC IDC LEAD LOCATION: 753860
MDC IDC MSMT LEADCHNL RV IMPEDANCE VALUE: 645 Ohm
MDC IDC MSMT LEADCHNL RV PACING THRESHOLD AMPLITUDE: 0.75 V
MDC IDC MSMT LEADCHNL RV PACING THRESHOLD PULSEWIDTH: 0.4 ms
MDC IDC MSMT LEADCHNL RV SENSING INTR AMPL: 22.4 mV
MDC IDC SET LEADCHNL RA PACING AMPLITUDE: 4 V
MDC IDC SET LEADCHNL RV SENSING SENSITIVITY: 5.6 mV
MDC IDC STAT BRADY AP VP PERCENT: 0 %
MDC IDC STAT BRADY AP VS PERCENT: 0 %
MDC IDC STAT BRADY AS VP PERCENT: 2 %
MDC IDC STAT BRADY AS VS PERCENT: 98 %

## 2017-02-25 MED ORDER — FUROSEMIDE 20 MG PO TABS: 40.0000 mg | ORAL_TABLET | Freq: Every day | ORAL | 3 refills | Status: DC

## 2017-02-25 NOTE — Progress Notes (Signed)
PCP: [name], Kim L, MD Primary Cardiologist:  Dr SwazilandJordan  [name] is a 83 y.o. female who presents today for routine electrophysiology followup.  Since her PPM generator change, the patient reports doing very well.  Today, she denies symptoms of palpitations, chest pain, shortness of breath,  lower extremity edema, dizziness, presyncope, or syncope.  The patient is otherwise without complaint today.   Past Medical History:  Diagnosis Date  . Anxiety   . Atrial fibrillation (HCC)    a. in sinus on Tikosyn and coumadin;  b. s/p MAZE @ time of MVR/CABG 05/2008;  c. h/o pulm toxicity while on amio.  . Chronic anticoagulation    coumadin  . Chronic systolic CHF (congestive heart failure) (HCC)    a. 02/2013 Echo: EF 35% inflat AK, mod to sev calcified MV annulus w/o MS, mod dil LA, PASP 33mmHg.  Marland Kitchen. Coronary artery disease    a. 1996 s/p PS BMS;  b. 03/2007 NSTEMI/PCI: 3.0x24 and 3.0x28 Taxus DES' t RCA; c. 05/2008 CABG x 2 with MV repair and Maze (LIMA->LAD, VG->PDA)  . Depression   . High risk medication use    Tikosyn  . Hyperlipidemia   . Hypertension   . IDA (iron deficiency anemia)   . LBBB (left bundle branch block)   . Mitral insufficiency    a. 05/2008 s/p MV repair with [employer] MV ring (model 5200, 26mm ser # [id number].  . Subarachnoid hemorrhage (HCC)    a. in setting of syncope/fall/digoxin toxicity 11/2007.  Alcide Clever. Tachycardia-bradycardia [facility](HCC)    a. 05/2008 s/p MDT P1501DR Enrhythm DC PPM, ser # [id number].   Past Surgical History:  Procedure Laterality Date  . CARDIAC CATHETERIZATION  03/19/2007   EF 45%. THERE IS 2+ MITRAL INSUFFICIENCY. THERE IS MODERATE INFERIOR WALL HYPOKINESIA WITH OVERALL MILD TO MODERATE LV DYSFUNCTION  . CORONARY ARTERY BYPASS GRAFT  04/2008   INCLUDING A MITRAL VALVE REPAIR AND A LEFT SIDED MAZE PROCEDURE  . CORONARY ARTERY BYPASS GRAFT     X2 with LIMA to LAD and SVG to PD and WITH A LEFT SIDED MAZE PROCEDURE & MITRAL VALVE REPAIR. THIS  INCLUDED AN LIMA GRAFT TO THE LAD, AND A SAPHENOUS VEIN GRAFT TO THE PDA.  Marland Kitchen. CORONARY STENT PLACEMENT  1996, AND 03/2007   RIGHT CORONARY  . EP IMPLANTABLE DEVICE N/A 10/16/2016   Procedure: PPM Generator Changeout;  Surgeon: Hillis RangeJames Javian Nudd, MD;  [facility];  Service: Cardiovascular;  Laterality: N/A;  . MITRAL VALVE REPAIR    . PACEMAKER INSERTION  05/19/2008   MDT EnRhythm implanted for mobitz II AV block by Dr Reyes IvanKersey  . REMOVAL OF COLON POLYP     AND A LARGE CECAL POLYP  . TRANSTHORACIC ECHOCARDIOGRAM  06/21/2008   EF 35%    ROS- all systems are reviewed and negative except as per HPI above  Current Outpatient Prescriptions  Medication Sig Dispense Refill  . carboxymethylcellulose (REFRESH PLUS) 0.5 % SOLN Place 1 drop into both eyes 3 (three) times daily as needed (dry eyes).     . Cholecalciferol (VITAMIN D) 2000 UNITS tablet Take 2,000 Units by mouth daily.      Marland Kitchen. dofetilide (TIKOSYN) 250 MCG capsule TAKE 1 CAPSULE BY MOUTH TWICE A DAY 180 capsule 2  . furosemide (LASIX) 20 MG tablet Take 2 tablets (40 mg total) by mouth daily. 90 tablet 3  . metoprolol tartrate (LOPRESSOR) 25 MG tablet TAKE 0.5 TABLETS (12.5 MG TOTAL) BY MOUTH 2 (TWO) TIMES  DAILY. 90 tablet 2  . pantoprazole (PROTONIX) 40 MG tablet TAKE 1 TABLET (40 MG TOTAL) BY MOUTH DAILY. 90 tablet 2  . potassium chloride SA (K-DUR,KLOR-CON) 20 MEQ tablet Take 1 tablet (20 mEq total) by mouth 2 (two) times daily. 60 tablet 11   No current facility-administered medications for this visit.     Physical Exam: Vitals:   02/25/17 1356  BP: 118/76  Pulse: 73  SpO2: 95%  Weight: 158 lb 12.8 oz (72 kg)  Height: 5\' 5"  (1.651 m)    GEN- The patient is well appearing, alert and oriented x 3 today.   Head- normocephalic, atraumatic Eyes-  Sclera clear, conjunctiva pink Ears- hearing intact Oropharynx- clear Lungs- Clear to ausculation bilaterally, normal work of breathing Chest- pacemaker pocket is well  healed Heart- Regular rate and rhythm, no murmurs, rubs or gallops, PMI not laterally displaced GI- soft, NT, ND, + BS Extremities- no clubbing, cyanosis, or edema  Pacemaker interrogation- reviewed in detail today,  See PACEART report  ekg today reveals sinus rhythm 73 bpm, LBBB (QTc 528 msec)  Assessment and Plan:  1. Sinus bradycardia Normal pacemaker function Chronically elevated atrial threshold but does not pace.  No plans to revise See Arita Miss Art report No changes today  2. afib Well controlled on tikosyn Dr Swaziland to follow BMET, MG QT I stable Poor candidate for anticoagulation long term  3. CAD No ischemic symptoms Stable No change required today  4. HTN Stable No change required today  5. Chronic systolic dysfunction euvolemic No changes today  Hillis Range MD, [facility] 02/25/2017 2:18 PM

## 2017-02-25 NOTE — Patient Instructions (Addendum)
Medication Instructions:  Your physician recommends that you continue on your current medications as directed. Please refer to the Current Medication list given to you today.  Follow-Up: Remote monitoring is used to monitor your Pacemaker from home. This monitoring reduces the number of office visits required to check your device to one time per year. It allows us to keep an eye on the functioning of your device to ensure it is working properly. You are scheduled for a device check from home on 05/27/17. You may send your transmission at any time that day. If you have a wireless device, the transmission will be sent automatically. After your physician reviews your transmission, you will receive a postcard with your next transmission date.  Your physician wants you to follow-up in: 6 Months with Dr. SwazilandJordan and 1 year with Dr. Johney FrameAllred. You will receive a reminder letter in the mail two months in advance. If you don't receive a letter, please call our office to schedule the follow-up appointment.   Any Other Special Instructions Will Be Listed Below (If Applicable).     If you need a refill on your cardiac medications before your next appointment, please call your pharmacy.

## 2017-04-30 ENCOUNTER — Other Ambulatory Visit: Payer: Self-pay | Admitting: Nurse Practitioner

## 2017-05-15 ENCOUNTER — Other Ambulatory Visit: Payer: Self-pay | Admitting: Nurse Practitioner

## 2017-05-27 ENCOUNTER — Encounter: Payer: Medicare Other | Admitting: *Deleted

## 2017-05-30 ENCOUNTER — Encounter: Payer: Self-pay | Admitting: Cardiology

## 2017-06-01 ENCOUNTER — Other Ambulatory Visit: Payer: Self-pay | Admitting: Nurse Practitioner

## 2017-08-23 ENCOUNTER — Other Ambulatory Visit: Payer: Self-pay | Admitting: Internal Medicine

## 2017-10-07 ENCOUNTER — Telehealth: Payer: Self-pay

## 2017-10-07 NOTE — Telephone Encounter (Signed)
**Note De-Identified Lane Kjos Obfuscation** We received a Dofetilide PA form Jolleen Seman fax from [facility]. I have completed the form, Dr Johney FrameAllred has signed it and I have faxed it back to [facility].

## 2017-10-09 NOTE — Telephone Encounter (Signed)
**Note De-Identified Perel Hauschild Obfuscation** Approval on Dofetilide PA received Ruddy Swire fax from [facility]. Approval good from 10/07/2018.

## 2017-10-28 ENCOUNTER — Telehealth: Payer: Self-pay | Admitting: *Deleted

## 2017-10-28 NOTE — Telephone Encounter (Signed)
LMOM to return call to [facility].  Need home monitor SN to pair in [facility] for remote monitoring every 3 months.

## 2017-11-01 ENCOUNTER — Other Ambulatory Visit: Payer: Self-pay | Admitting: Internal Medicine

## 2017-12-18 ENCOUNTER — Encounter: Payer: Self-pay | Admitting: Internal Medicine

## 2017-12-18 ENCOUNTER — Ambulatory Visit (INDEPENDENT_AMBULATORY_CARE_PROVIDER_SITE_OTHER): Payer: Medicare Other | Admitting: Internal Medicine

## 2017-12-18 VITALS — BP 112/64 | HR 95 | Ht 65.0 in | Wt 151.0 lb

## 2017-12-18 DIAGNOSIS — Z79899 Other long term (current) drug therapy: Secondary | ICD-10-CM | POA: Diagnosis not present

## 2017-12-18 DIAGNOSIS — I480 Paroxysmal atrial fibrillation: Secondary | ICD-10-CM

## 2017-12-18 DIAGNOSIS — I25810 Atherosclerosis of coronary artery bypass graft(s) without angina pectoris: Secondary | ICD-10-CM | POA: Diagnosis not present

## 2017-12-18 DIAGNOSIS — I10 Essential (primary) hypertension: Secondary | ICD-10-CM | POA: Diagnosis not present

## 2017-12-18 DIAGNOSIS — I495 Sick sinus syndrome: Secondary | ICD-10-CM | POA: Diagnosis not present

## 2017-12-18 LAB — CUP PACEART INCLINIC DEVICE CHECK
Battery Remaining Longevity: 151 mo
Battery Voltage: 2.8 V
Brady Statistic AP VP Percent: 0 %
Brady Statistic AS VP Percent: 2 %
Date Time Interrogation Session: 20190313145150
Implantable Lead Implant Date: 20090812
Implantable Lead Location: 753860
Implantable Pulse Generator Implant Date: 20180109
Lead Channel Impedance Value: 648 Ohm
Lead Channel Pacing Threshold Amplitude: 0.75 V
Lead Channel Pacing Threshold Amplitude: 2.25 V
Lead Channel Pacing Threshold Pulse Width: 0.4 ms
Lead Channel Sensing Intrinsic Amplitude: 22.4 mV
Lead Channel Setting Pacing Amplitude: 4 V
Lead Channel Setting Pacing Pulse Width: 0.4 ms
MDC IDC LEAD IMPLANT DT: 20090812
MDC IDC LEAD LOCATION: 753859
MDC IDC MSMT BATTERY IMPEDANCE: 100 Ohm
MDC IDC MSMT LEADCHNL RA IMPEDANCE VALUE: 419 Ohm
MDC IDC MSMT LEADCHNL RA PACING THRESHOLD PULSEWIDTH: 1 ms
MDC IDC MSMT LEADCHNL RA SENSING INTR AMPL: 1 mV
MDC IDC MSMT LEADCHNL RV PACING THRESHOLD AMPLITUDE: 0.75 V
MDC IDC MSMT LEADCHNL RV PACING THRESHOLD PULSEWIDTH: 0.4 ms
MDC IDC SET LEADCHNL RV PACING AMPLITUDE: 2.5 V
MDC IDC SET LEADCHNL RV SENSING SENSITIVITY: 5.6 mV
MDC IDC STAT BRADY AP VS PERCENT: 0 %
MDC IDC STAT BRADY AS VS PERCENT: 98 %

## 2017-12-18 NOTE — Progress Notes (Signed)
PCP: Willow Ora, MD   Primary EP:  Dr Johney Frame  [name] is a 84 y.o. female who presents today for routine electrophysiology followup.  Since last being seen in our clinic, the patient reports doing very well.  Today, she denies symptoms of palpitations, chest pain, shortness of breath,  lower extremity edema, dizziness, presyncope, or syncope.  The patient is otherwise without complaint today.   Past Medical History:  Diagnosis Date  . Anxiety   . Atrial fibrillation (HCC)    a. in sinus on Tikosyn and coumadin;  b. s/p MAZE @ time of MVR/CABG 05/2008;  c. h/o pulm toxicity while on amio.  . Chronic anticoagulation    coumadin  . Chronic systolic CHF (congestive heart failure) (HCC)    a. 02/2013 Echo: EF 35% inflat AK, mod to sev calcified MV annulus w/o MS, mod dil LA, PASP .  Marland Kitchen Coronary artery disease    a. 1996 s/p PS BMS;  b. 03/2007 NSTEMI/PCI: 3.0x24 and 3.0x28 Taxus DES' t RCA; c. 05/2008 CABG x 2 with MV repair and Maze (LIMA->LAD, VG->PDA)  . Depression   . High risk medication use    Tikosyn  . Hyperlipidemia   . Hypertension   . IDA (iron deficiency anemia)   . LBBB (left bundle branch block)   . Mitral insufficiency    a. 05/2008 s/p MV repair with [employer] MV ring (model 5200, 26mm ser # [id number].  . Subarachnoid hemorrhage (HCC)    a. in setting of syncope/fall/digoxin toxicity 11/2007.  Alcide Clever [facility])    a. 05/2008 s/p MDT P1501DR Enrhythm DC PPM, ser # [id number].   Past Surgical History:  Procedure Laterality Date  . CARDIAC CATHETERIZATION  03/19/2007   EF 45%. THERE IS 2+ MITRAL INSUFFICIENCY. THERE IS MODERATE INFERIOR WALL HYPOKINESIA WITH OVERALL MILD TO MODERATE LV DYSFUNCTION  . CORONARY ARTERY BYPASS GRAFT  04/2008   INCLUDING A MITRAL VALVE REPAIR AND A LEFT SIDED MAZE PROCEDURE  . CORONARY ARTERY BYPASS GRAFT     X2 with LIMA to LAD and SVG to PD and WITH A LEFT SIDED MAZE PROCEDURE & MITRAL VALVE REPAIR.  THIS INCLUDED AN LIMA GRAFT TO THE LAD, AND A SAPHENOUS VEIN GRAFT TO THE PDA.  Marland Kitchen CORONARY STENT PLACEMENT  1996, AND 03/2007   RIGHT CORONARY  . EP IMPLANTABLE DEVICE N/A 10/16/2016   Procedure: PPM Generator Changeout;  Surgeon: Hillis Range, MD;  [facility];  Service: Cardiovascular;  Laterality: N/A;  . MITRAL VALVE REPAIR    . PACEMAKER INSERTION  05/19/2008   MDT EnRhythm implanted for mobitz II AV block by Dr Reyes Ivan  . REMOVAL OF COLON POLYP     AND A LARGE CECAL POLYP  . TRANSTHORACIC ECHOCARDIOGRAM  06/21/2008   EF 35%    ROS- all systems are reviewed and negative except as per HPI above  Current Outpatient Medications  Medication Sig Dispense Refill  . Cholecalciferol (VITAMIN D) 2000 UNITS tablet Take 2,000 Units by mouth daily.      Marland Kitchen dofetilide (TIKOSYN) 250 MCG capsule TAKE 1 CAPSULE (250 MCG) BY MOUTH TWICE DAILY 180 capsule 1  . furosemide (LASIX) 20 MG tablet Take 2 tablets (40 mg total) by mouth daily. 180 tablet 3  . metoprolol tartrate (LOPRESSOR) 25 MG tablet TAKE 0.5 TABLETS (12.5 MG TOTAL) BY MOUTH 2 (TWO) TIMES DAILY. 90 tablet 2  . pantoprazole (PROTONIX) 40 MG tablet TAKE 1 TABLET (40 MG TOTAL) BY  MOUTH DAILY. 90 tablet 1  . potassium chloride SA (K-DUR,KLOR-CON) 20 MEQ tablet Take 1 tablet (20 mEq total) by mouth 2 (two) times daily. (Patient not taking: Reported on 12/18/2017) 60 tablet 11   No current facility-administered medications for this visit.     Physical Exam: Vitals:   12/18/17 1151  BP: 112/64  Pulse: 95  Weight: 151 lb (68.5 kg)  Height: 5\' 5"  (1.651 m)    GEN- The patient is elderly and frail appearing, alert and oriented x 3 today.  Not happy (her usual affect)  Head- normocephalic, atraumatic Eyes-  Sclera clear, conjunctiva pink Ears- hearing intact Oropharynx- clear Lungs- Clear to ausculation bilaterally, normal work of breathing Chest- pacemaker pocket is well healed Heart- Regular rate and rhythm, no murmurs,  rubs or gallops, PMI not laterally displaced GI- soft, NT, ND, + BS Extremities- no clubbing, cyanosis, or edema  Pacemaker interrogation- reviewed in detail today,  See PACEART report  ekg tracing ordered today is personally reviewed and shows sinus with LBBB, Qtc 485 msec)  Assessment and Plan:  1. Symptomatic sinus bradycardia  Normal pacemaker function See Pace Art report No changes today We discussed MDT Adapta advisary in detail today (with patient and son).  I have had [employer] representative Leta Jungling(Marcia) involved by phone and we have reviewed programming together today.  No programming changes are recommended.  Conservative management is advised. Once software update is available, we will reach out to the patient  2. afib Well controlled with tikosyn (Qtc stable today). AF burden is 0.4% Bmet, mg ordered today Poor candidate for long term anticoagulation I have advised that she have BMET, MG followed by PCP twice per year  3. HTN Stable No change required today  4. CAD No ischemic symptoms No changes  Carelink Return to see me in a year  Hillis RangeJames Joline Encalada MD, [facility] 12/18/2017 12:08 PM

## 2017-12-18 NOTE — Patient Instructions (Signed)
Medication Instructions:  Your physician recommends that you continue on your current medications as directed. Please refer to the Current Medication list given to you today.  *If you need a refill on your cardiac medications before your next appointment, please call your pharmacy*  Labwork: Today: BMET & Magnesium level  Testing/Procedures: None ordered  Follow-Up: Remote monitoring is used to monitor your Pacemaker or ICD from home. This monitoring reduces the number of office visits required to check your device to one time per year. It allows us to keep an eye on the functioning of your device to ensure it is working properly. You are scheduled for a device check from home on 03/19/2018. You may send your transmission at any time that day. If you have a wireless device, the transmission will be sent automatically. After your physician reviews your transmission, you will receive a postcard with your next transmission date.  Your physician wants you to follow-up in: 1 year with Dr. Johney FrameAllred.  You will receive a reminder letter in the mail two months in advance. If you don't receive a letter, please call our office to schedule the follow-up appointment.  Thank you for choosing [facility]!!

## 2017-12-19 LAB — BASIC METABOLIC PANEL
BUN/Creatinine Ratio: 20 (ref 12–28)
BUN: 18 mg/dL (ref 8–27)
CALCIUM: 9.5 mg/dL (ref 8.7–10.3)
CO2: 21 mmol/L (ref 20–29)
CREATININE: 0.89 mg/dL (ref 0.57–1.00)
Chloride: 111 mmol/L — ABNORMAL HIGH (ref 96–106)
GFR calc Af Amer: 68 mL/min/{1.73_m2} (ref >59)
GFR, EST NON AFRICAN AMERICAN: 59 mL/min/{1.73_m2} — AB (ref >59)
GLUCOSE: 87 mg/dL (ref 65–99)
POTASSIUM: 4 mmol/L (ref 3.5–5.2)
SODIUM: 146 mmol/L — AB (ref 134–144)

## 2017-12-19 LAB — CBC WITH DIFFERENTIAL/PLATELET
BASOS: 1 %
Basophils Absolute: 0.1 10*3/uL (ref 0.0–0.2)
EOS (ABSOLUTE): 0.1 10*3/uL (ref 0.0–0.4)
EOS: 1 %
Hematocrit: 42.7 % (ref 34.0–46.6)
Hemoglobin: 13.8 g/dL (ref 11.1–15.9)
IMMATURE GRANS (ABS): 0 10*3/uL (ref 0.0–0.1)
IMMATURE GRANULOCYTES: 0 %
LYMPHS: 19 %
Lymphocytes Absolute: 1.6 10*3/uL (ref 0.7–3.1)
MCH: 28.8 pg (ref 26.6–33.0)
MCHC: 32.3 g/dL (ref 31.5–35.7)
MCV: 89 fL (ref 79–97)
MONOS ABS: 0.5 10*3/uL (ref 0.1–0.9)
Monocytes: 6 %
NEUTROS PCT: 73 %
Neutrophils Absolute: 6.1 10*3/uL (ref 1.4–7.0)
PLATELETS: 324 10*3/uL (ref 150–379)
RBC: 4.8 x10E6/uL (ref 3.77–5.28)
RDW: 15.4 % (ref 12.3–15.4)
WBC: 8.3 10*3/uL (ref 3.4–10.8)

## 2018-01-24 ENCOUNTER — Ambulatory Visit: Payer: Medicare Other | Admitting: Cardiology

## 2018-02-24 ENCOUNTER — Encounter (HOSPITAL_COMMUNITY): Payer: Self-pay | Admitting: Emergency Medicine

## 2018-02-24 ENCOUNTER — Other Ambulatory Visit: Payer: Self-pay

## 2018-02-24 ENCOUNTER — Emergency Department (HOSPITAL_COMMUNITY)
Admission: EM | Admit: 2018-02-24 | Discharge: 2018-02-24 | Disposition: A | Discharge status: Home / Self Care | Payer: Medicare Other | Attending: Emergency Medicine | Admitting: Emergency Medicine

## 2018-02-24 ENCOUNTER — Emergency Department (HOSPITAL_COMMUNITY): Payer: Medicare Other

## 2018-02-24 DIAGNOSIS — J90 Pleural effusion, not elsewhere classified: Secondary | ICD-10-CM | POA: Diagnosis not present

## 2018-02-24 DIAGNOSIS — Z79899 Other long term (current) drug therapy: Secondary | ICD-10-CM | POA: Insufficient documentation

## 2018-02-24 DIAGNOSIS — Z87891 Personal history of nicotine dependence: Secondary | ICD-10-CM | POA: Insufficient documentation

## 2018-02-24 DIAGNOSIS — I110 Hypertensive heart disease with heart failure: Secondary | ICD-10-CM | POA: Insufficient documentation

## 2018-02-24 DIAGNOSIS — I5022 Chronic systolic (congestive) heart failure: Secondary | ICD-10-CM | POA: Insufficient documentation

## 2018-02-24 DIAGNOSIS — I2510 Atherosclerotic heart disease of native coronary artery without angina pectoris: Secondary | ICD-10-CM | POA: Insufficient documentation

## 2018-02-24 DIAGNOSIS — J441 Chronic obstructive pulmonary disease with (acute) exacerbation: Secondary | ICD-10-CM | POA: Insufficient documentation

## 2018-02-24 DIAGNOSIS — Z951 Presence of aortocoronary bypass graft: Secondary | ICD-10-CM | POA: Diagnosis not present

## 2018-02-24 DIAGNOSIS — R0602 Shortness of breath: Secondary | ICD-10-CM | POA: Diagnosis present

## 2018-02-24 DIAGNOSIS — J40 Bronchitis, not specified as acute or chronic: Secondary | ICD-10-CM | POA: Diagnosis not present

## 2018-02-24 LAB — I-STAT TROPONIN, ED: Troponin i, poc: 0 ng/mL (ref 0.00–0.08)

## 2018-02-24 LAB — CBC
HEMATOCRIT: 46.2 % — AB (ref 36.0–46.0)
HEMOGLOBIN: 14.7 g/dL (ref 12.0–15.0)
MCH: 28.6 pg (ref 26.0–34.0)
MCHC: 31.8 g/dL (ref 30.0–36.0)
MCV: 89.9 fL (ref 78.0–100.0)
Platelets: 253 10*3/uL (ref 150–400)
RBC: 5.14 MIL/uL — ABNORMAL HIGH (ref 3.87–5.11)
RDW: 14.5 % (ref 11.5–15.5)
WBC: 7.9 10*3/uL (ref 4.0–10.5)

## 2018-02-24 LAB — BASIC METABOLIC PANEL
ANION GAP: 9 (ref 5–15)
BUN: 8 mg/dL (ref 6–20)
CO2: 24 mmol/L (ref 22–32)
Calcium: 8.9 mg/dL (ref 8.9–10.3)
Chloride: 109 mmol/L (ref 101–111)
Creatinine, Ser: 0.84 mg/dL (ref 0.44–1.00)
Glucose, Bld: 103 mg/dL — ABNORMAL HIGH (ref 65–99)
POTASSIUM: 3.3 mmol/L — AB (ref 3.5–5.1)
SODIUM: 142 mmol/L (ref 135–145)

## 2018-02-24 LAB — BRAIN NATRIURETIC PEPTIDE: B NATRIURETIC PEPTIDE 5: 717.6 pg/mL — AB (ref 0.0–100.0)

## 2018-02-24 MED ORDER — AZITHROMYCIN 250 MG PO TABS: 250.0000 mg | ORAL_TABLET | Freq: Every day | ORAL | 0 refills | Status: AC

## 2018-02-24 MED ORDER — PREDNISONE 10 MG PO TABS: 10.0000 mg | ORAL_TABLET | Freq: Every day | ORAL | 0 refills | Status: AC

## 2018-02-24 MED ORDER — ALBUTEROL SULFATE HFA 108 (90 BASE) MCG/ACT IN AERS: 1.0000 | INHALATION_SPRAY | Freq: Four times a day (QID) | RESPIRATORY_TRACT | 0 refills | Status: AC | PRN

## 2018-02-24 MED ORDER — IPRATROPIUM-ALBUTEROL 0.5-2.5 (3) MG/3ML IN SOLN: 3.0000 mL | Freq: Once | RESPIRATORY_TRACT | Status: DC

## 2018-02-24 NOTE — Discharge Instructions (Signed)
Chest x-ray showed a small left pleural effusion.  Recommend repeat chest x-ray in 2 to 3 weeks.  Stop smoking.  Prescription for antibiotic, inhaler, prednisone.

## 2018-02-24 NOTE — ED Provider Notes (Signed)
[facility] EMERGENCY DEPARTMENT Provider Note   CSN: [id number] Arrival date & time: 02/24/18  1612     History   Chief Complaint Chief Complaint  Patient presents with  . Shortness of Breath    HPI [name] is a 84 y.o. female.  Level 5 caveat for cognitive impairment.  Patient presents with cough, congestion, dyspnea for 1 week.  She has a history of COPD and continues to smoke.  No fever, sweats, chills, rusty sputum, sternal chest pain.  She smokes 10 cigarettes/day.  Severity of symptoms is mild to moderate.  Nothing makes symptoms better or worse.     Past Medical History:  Diagnosis Date  . Anxiety   . Atrial fibrillation (HCC)    a. in sinus on Tikosyn and coumadin;  b. s/p MAZE @ time of MVR/CABG 05/2008;  c. h/o pulm toxicity while on amio.  . Chronic anticoagulation    coumadin  . Chronic systolic CHF (congestive heart failure) (HCC)    a. 02/2013 Echo: EF 35% inflat AK, mod to sev calcified MV annulus w/o MS, mod dil LA, PASP .  Marland Kitchen Coronary artery disease    a. 1996 s/p PS BMS;  b. 03/2007 NSTEMI/PCI: 3.0x24 and 3.0x28 Taxus DES' t RCA; c. 05/2008 CABG x 2 with MV repair and Maze (LIMA->LAD, VG->PDA)  . Depression   . High risk medication use    Tikosyn  . Hyperlipidemia   . Hypertension   . IDA (iron deficiency anemia)   . LBBB (left bundle branch block)   . Mitral insufficiency    a. 05/2008 s/p MV repair with [employer] MV ring (model 5200, 26mm ser # [id number].  . Subarachnoid hemorrhage (HCC)    a. in setting of syncope/fall/digoxin toxicity 11/2007.  Alcide Clever [facility])    a. 05/2008 s/p MDT P1501DR Enrhythm DC PPM, ser # [id number].    Patient Active Problem List   Diagnosis Date Noted  . Shortness of breath 08/16/2016  . Cognitive impairment 01/03/2016  . Herpes zoster ophthalmicus of right eye 01/03/2016  . Major depression, recurrent, chronic (HCC) 01/03/2016  . Statin intolerance 01/03/2016  .  Tobacco dependence 01/03/2016  . CHF (congestive heart failure) (HCC) 04/08/2014  . Essential (primary) hypertension 11/09/2013  . Microcytic anemia 08/11/2013  . Acute on chronic systolic heart failure (HCC) 02/21/2013  . Acute bronchitis 02/21/2013  . Eczema 05/30/2012  . Community acquired pneumonia 04/09/2012  . Hypercholesteremia 11/05/2011  . Somatic complaints, multiple 08/06/2011  . Chronic systolic congestive heart failure (HCC) 05/09/2011  . PAF (paroxysmal atrial fibrillation) (HCC) 11/24/2010  . Mixed hyperlipidemia 11/24/2010  . CAD 09/06/2010  . Atrial fibrillation (HCC) 09/06/2010  . Sick sinus syndrome (HCC) 09/06/2010  . Coronary artery disease involving native coronary artery of native heart without angina pectoris 12/28/2008    Past Surgical History:  Procedure Laterality Date  . CARDIAC CATHETERIZATION  03/19/2007   EF 45%. THERE IS 2+ MITRAL INSUFFICIENCY. THERE IS MODERATE INFERIOR WALL HYPOKINESIA WITH OVERALL MILD TO MODERATE LV DYSFUNCTION  . CORONARY ARTERY BYPASS GRAFT  04/2008   INCLUDING A MITRAL VALVE REPAIR AND A LEFT SIDED MAZE PROCEDURE  . CORONARY ARTERY BYPASS GRAFT     X2 with LIMA to LAD and SVG to PD and WITH A LEFT SIDED MAZE PROCEDURE & MITRAL VALVE REPAIR. THIS INCLUDED AN LIMA GRAFT TO THE LAD, AND A SAPHENOUS VEIN GRAFT TO THE PDA.  Marland Kitchen CORONARY STENT PLACEMENT  1996, AND 03/2007   RIGHT  CORONARY  . EP IMPLANTABLE DEVICE N/A 10/16/2016   Procedure: PPM Generator Changeout;  Surgeon: Hillis Range, MD;  [facility];  Service: Cardiovascular;  Laterality: N/A;  . MITRAL VALVE REPAIR    . PACEMAKER INSERTION  05/19/2008   MDT EnRhythm implanted for mobitz II AV block by Dr Reyes Ivan  . REMOVAL OF COLON POLYP     AND A LARGE CECAL POLYP  . TRANSTHORACIC ECHOCARDIOGRAM  06/21/2008   EF 35%     OB History   None      Home Medications    Prior to Admission medications   Medication Sig Start Date End Date Taking? Authorizing  Provider  albuterol (PROVENTIL HFA;VENTOLIN HFA) 108 (90 Base) MCG/ACT inhaler Inhale 1-2 puffs into the lungs every 6 (six) hours as needed for wheezing or shortness of breath. 02/24/18   Donnetta Hutching, MD  azithromycin (ZITHROMAX) 250 MG tablet Take 1 tablet (250 mg total) by mouth daily. Take first 2 tablets together, then 1 every day until finished. 02/24/18   Donnetta Hutching, MD  Cholecalciferol (VITAMIN D) 2000 UNITS tablet Take 2,000 Units by mouth daily.      [provider]  dofetilide (TIKOSYN) 250 MCG capsule TAKE 1 CAPSULE (250 MCG) BY MOUTH TWICE DAILY 08/23/17   Allred, Fayrene Fearing, MD  furosemide (LASIX) 20 MG tablet Take 2 tablets (40 mg total) by mouth daily. 02/25/17   Allred, Fayrene Fearing, MD  metoprolol tartrate (LOPRESSOR) 25 MG tablet TAKE 0.5 TABLETS (12.5 MG TOTAL) BY MOUTH 2 (TWO) TIMES DAILY. 06/03/17   Allred, Fayrene Fearing, MD  pantoprazole (PROTONIX) 40 MG tablet TAKE 1 TABLET (40 MG TOTAL) BY MOUTH DAILY. 11/01/17   Allred, Fayrene Fearing, MD  potassium chloride SA (K-DUR,KLOR-CON) 20 MEQ tablet Take 1 tablet (20 mEq total) by mouth 2 (two) times daily. Patient not taking: Reported on 12/18/2017 11/21/15   Hillis Range, MD  predniSONE (DELTASONE) 10 MG tablet Take 1 tablet (10 mg total) by mouth daily with breakfast. 2 tablets for 5 days, 1 tablet for 5 days, 1/2 tablet for 5 days 02/24/18   Donnetta Hutching, MD    Family History Family History  Problem Relation Age of Onset  . Heart attack Mother   . Cancer Father     Social History Social History   Tobacco Use  . Smoking status: Former Smoker    Packs/day: 0.50    Years: 60.00    Pack years: 30.00    Types: Cigarettes  . Smokeless tobacco: Never Used  Substance Use Topics  . Alcohol use: No  . Drug use: No     Allergies   Amoxicillin; Amiodarone; Atorvastatin; Atorvastatin calcium; Diphenhydramine hcl; and Rosuvastatin   Review of Systems Review of Systems  All other systems reviewed and are negative.    Physical Exam Updated  Vital Signs BP (!) 130/55 (BP Location: Right Arm)   Pulse 88   Temp 98 F (36.7 C) (Oral)   Resp (!) 22   SpO2 94%   Physical Exam  Constitutional: She is oriented to person, place, and time.  No acute distress; no tachypnea or obvious dyspnea.  HENT:  Head: Normocephalic and atraumatic.  Eyes: Conjunctivae are normal.  Neck: Neck supple.  Cardiovascular: Normal rate and regular rhythm.  Pulmonary/Chest: Effort normal and breath sounds normal.  Abdominal: Soft. Bowel sounds are normal.  Musculoskeletal: Normal range of motion.  Neurological: She is alert and oriented to person, place, and time.  Skin: Skin is warm and dry.  Psychiatric: She has a normal mood and affect. Her behavior is normal.  Nursing note and vitals reviewed.    ED Treatments / Results  Labs (all labs ordered are listed, but only abnormal results are displayed) Labs Reviewed  BASIC METABOLIC PANEL - Abnormal; Notable for the following components:      Result Value   Potassium 3.3 (*)    Glucose, Bld 103 (*)    All other components within normal limits  CBC - Abnormal; Notable for the following components:   RBC 5.14 (*)    HCT 46.2 (*)    All other components within normal limits  BRAIN NATRIURETIC PEPTIDE - Abnormal; Notable for the following components:   B Natriuretic Peptide 717.6 (*)    All other components within normal limits  I-STAT TROPONIN, ED    EKG EKG Interpretation  Date/Time:  Monday Feb 24 2018 16:19:40 EDT Ventricular Rate:  91 PR Interval:  158 QRS Duration: 152 QT Interval:  402 QTC Calculation: 494 R Axis:   24 Text Interpretation:  Normal sinus rhythm Left bundle branch block Abnormal ECG Confirmed by Donnetta Hutching ([id number]) on 02/24/2018 5:58:33 PM   Radiology Dg Chest 2 View  Result Date: 02/24/2018 CLINICAL DATA:  Shortness of breath EXAM: CHEST - 2 VIEW COMPARISON:  Chest radiograph 08/16/2016 FINDINGS: There is a leftchest wall AICDwith leads projecting within the  right atrium and right ventricle. Remote median sternotomy for CABG. Small left pleural effusion with associated atelectasis. No focal airspace consolidation or pulmonary edema. Normal cardiomediastinal contours. IMPRESSION: Trace left pleural effusion with associated atelectasis. Electronically Signed   By: Deatra Robinson M.D.   On: 02/24/2018 17:48    Procedures Procedures (including critical care time)  Medications Ordered in ED Medications  ipratropium-albuterol (DUONEB) 0.5-2.5 (3) MG/3ML nebulizer solution 3 mL (3 mLs Nebulization Not Given 02/24/18 1826)     Initial Impression / Assessment and Plan / ED Course  I have reviewed the triage vital signs and the nursing notes.  Pertinent labs & imaging results that were available during my care of the patient were reviewed by me and considered in my medical decision making (see chart for details).     History and physical most consistent with upper respiratory infection and COPD exacerbation.  Chest x-ray shows a trace left pleural effusion.  Patient wants to go home.  These findings were discussed with the patient and her daughter.  Discharge medications Zithromax, albuterol inhaler, prednisone.  Final Clinical Impressions(s) / ED Diagnoses   Final diagnoses:  COPD exacerbation (HCC)  Bronchitis  Pleural effusion, left    ED Discharge Orders        Ordered    azithromycin (ZITHROMAX) 250 MG tablet  Daily     02/24/18 1914    predniSONE (DELTASONE) 10 MG tablet  Daily with breakfast     02/24/18 1914    albuterol (PROVENTIL HFA;VENTOLIN HFA) 108 (90 Base) MCG/ACT inhaler  Every 6 hours PRN     02/24/18 1914       Donnetta Hutching, MD 02/24/18 1921

## 2018-02-24 NOTE — ED Notes (Signed)
Pt in imaging at this time.

## 2018-02-24 NOTE — ED Triage Notes (Signed)
Pt presents to ED for assessment of shortness of breath, with wet cough with off-white sputum.  Patient hx of CHF (no leg edema), hx of seasonal allergies, hx of pneumonia.

## 2018-02-24 NOTE — ED Provider Notes (Signed)
Patient placed in Quick Look pathway, seen and evaluated   Chief Complaint: Cough for about a week wit SOB   HPI:   Pt with cough for a week, intermittent SOB, sinus congestion, sore throat. No chest pain. Some SOB. Has been taking robitussin. Generalized weakness. Hx of CHF. No fever or chills. Pt states she feels fine now and has no complaints.   ROS:  Cough, sinus congestion, sob, weakness  Physical Exam:   Gen: No distress  Neuro: Awake and Alert  Skin: Warm    Focused Exam: rhonchi bilaterally. Regular hr and rhythm.    Initiation of care has begun. The patient has been counseled on the process, plan, and necessity for staying for the completion/evaluation, and the remainder of the medical screening examination   Pt with cough for a week, sinus congestion, generalized weakness. Afebrile. Hx of CHF. Will check labs, CXR, try breathing tx.   Vitals:   02/24/18 1626  BP: 119/61  Pulse: 89  Resp: 20  Temp: 98 F (36.7 C)  TempSrc: Oral  SpO2: 94%       Jaynie Crumble, PA-C 02/24/18 1648    Mesner, Karmel Cower, MD 02/25/18 0003

## 2018-02-25 ENCOUNTER — Telehealth: Payer: Self-pay | Admitting: *Deleted

## 2018-02-25 NOTE — Telephone Encounter (Signed)
Pharmacy called related to Rx: Zithromax drug interaction with Tikosyn .Marland KitchenMarland KitchenEDCM clarified with EDP (Maczis) to change Rx to: Doxycycline 100 mg PO BID X 5days #10.

## 2018-03-19 ENCOUNTER — Encounter: Payer: Medicare Other | Admitting: *Deleted

## 2018-03-19 ENCOUNTER — Telehealth: Payer: Self-pay | Admitting: Cardiology

## 2018-03-19 NOTE — Telephone Encounter (Signed)
LMOVM reminding pt to send remote transmission.   

## 2018-03-20 ENCOUNTER — Encounter: Payer: Self-pay | Admitting: Cardiology

## 2018-03-20 NOTE — Progress Notes (Signed)
Letter  

## 2018-05-01 ENCOUNTER — Encounter: Payer: Self-pay | Admitting: Cardiology

## 2018-06-21 ENCOUNTER — Other Ambulatory Visit: Payer: Self-pay | Admitting: Internal Medicine

## 2018-07-30 ENCOUNTER — Encounter: Payer: Self-pay | Admitting: Cardiology

## 2018-08-09 ENCOUNTER — Other Ambulatory Visit: Payer: Self-pay | Admitting: Internal Medicine

## 2018-11-10 ENCOUNTER — Other Ambulatory Visit: Payer: Self-pay | Admitting: Internal Medicine

## 2018-12-14 ENCOUNTER — Other Ambulatory Visit: Payer: Self-pay | Admitting: Internal Medicine

## 2018-12-30 ENCOUNTER — Other Ambulatory Visit: Payer: Self-pay | Admitting: *Deleted

## 2018-12-30 NOTE — Telephone Encounter (Signed)
Peter left a msg on the refill vm. Call back number provided for him was [phone]. I was unable to reach him to discuss. Routing as an Financial planner as we have not refilled tikosyn for this patient since 08/23/2017 and that was only sent in for #180 with 1 refill. Was not sure if okay to approve without acknowledging that the patient may have been non compliant with this medication. Please advise. Thanks, MI

## 2019-01-01 MED ORDER — DOFETILIDE 250 MCG PO CAPS: ORAL_CAPSULE | ORAL | 0 refills | Status: AC

## 2019-01-05 ENCOUNTER — Telehealth: Payer: Self-pay | Admitting: Cardiology

## 2019-01-05 NOTE — Telephone Encounter (Signed)
LMOVM for pt to return call. Does pt have a home monitor? If so we need model number and serial number. If she doesn't have a monitor we need to call tech support to have a new monitor ordered.

## 2019-01-05 NOTE — Telephone Encounter (Signed)
-----   Message from Marily Lente, NP sent at 01/03/2019  6:14 PM EDT ----- Regarding: FW: needs remote checks Please call patient - needs to send remote transmission  Thanks! ----- Message ----- From: Wiliam Ke, RN Sent: 01/01/2019   7:22 AM EDT To: [facility] Subject: needs remote checks                            Hey all, So this lady is due for her yearly visit.   For whatever reason she stopped doing remotes.   Could we look into that?  I gave her 90 days of dofetilide and made her an appt for a month out.  But I would feel better if we were at least following her remotely if possible.  Also-I am working from home today.  Cell # [phone] if you  need me.  :) Boneta Lucks

## 2019-01-06 NOTE — Telephone Encounter (Signed)
LMOVM for pt son to return call 

## 2019-01-07 NOTE — Telephone Encounter (Signed)
Spoke w/ pt son and he believes that pt has a home monitor. I requested that he call me w/ the model number and serial number to the monitor. Pt son stated that he wouldn't be back in [place] until Monday and he'll do it then.

## 2019-01-13 NOTE — Telephone Encounter (Signed)
Spoke w/ pt son and he stated that patient does have the home monitor but patient is refusing to use the monitor. I expressed the importance of the pt using the monitor at this time w/ COVID-19. Pt son stated that patient will have device checked in office and if she can't do that she will not have it checked. Informed pt that I would forward message and call back w/ recommendations. Pt son verbalized understanding.

## 2019-01-13 NOTE — Telephone Encounter (Signed)
Boneta Lucks, please call and speak with family and let them know the importance of remote monitoring during this time. If patient refuses again, schedule follow-up in office after COVID 19 has passed per her request.

## 2019-01-15 NOTE — Telephone Encounter (Signed)
Outreach attempted.  No answer.

## 2019-01-27 ENCOUNTER — Telehealth: Payer: Self-pay

## 2019-01-27 NOTE — Telephone Encounter (Signed)
Spoke with pt and pts son regarding appt on 01/28/19. Pts son stated pt does not have access to a smart device and will not be with his mother at the time of the appt. Pt stated she can not check vitals or upload EKG. Pt was advise to keep televisit.

## 2019-01-28 ENCOUNTER — Other Ambulatory Visit: Payer: Self-pay

## 2019-01-28 ENCOUNTER — Telehealth (INDEPENDENT_AMBULATORY_CARE_PROVIDER_SITE_OTHER): Payer: Medicare Other | Admitting: Internal Medicine

## 2019-01-28 DIAGNOSIS — I10 Essential (primary) hypertension: Secondary | ICD-10-CM | POA: Diagnosis not present

## 2019-01-28 DIAGNOSIS — I480 Paroxysmal atrial fibrillation: Secondary | ICD-10-CM | POA: Diagnosis not present

## 2019-01-28 DIAGNOSIS — I495 Sick sinus syndrome: Secondary | ICD-10-CM | POA: Diagnosis not present

## 2019-01-28 NOTE — Telephone Encounter (Signed)
Pt with follow up with Dr. Johney Frame 01/28/2019

## 2019-01-28 NOTE — Progress Notes (Signed)
Electrophysiology TeleHealth Note   Due to national recommendations of social distancing due to COVID 19, an audio/  telehealth visit is felt to be most appropriate for this patient at this time.  Verbal consent obtained from patient today.  She does not have smartphone or internet and is unable to do virtual visit.   Date:  01/28/2019   ID:  [name], DOB March 14, 1930, MRN [id number]  Location: patient's home  Provider location: [address], [place]  Evaluation Performed: Follow-up visit  PCP:  Patient, No Pcp Per   Electrophysiologist:  Dr Johney Frame  Chief Complaint:  afib  History of Present Illness:    [name] is a 85 y.o. female who presents via audio conferencing for a telehealth visit today.  Since last being seen in our clinic, the patient reports doing very well.  Today, she denies symptoms of palpitations, chest pain, shortness of breath,  lower extremity edema, dizziness, presyncope, or syncope.  The patient is otherwise without complaint today.  + allergies with itchy eyes. The patient denies symptoms of fevers, chills, cough, or new SOB worrisome for COVID 19.  Past Medical History:  Diagnosis Date  . Anxiety   . Atrial fibrillation (HCC)    a. in sinus on Tikosyn and coumadin;  b. s/p MAZE @ time of MVR/CABG 05/2008;  c. h/o pulm toxicity while on amio.  . Chronic anticoagulation    coumadin  . Chronic systolic CHF (congestive heart failure) (HCC)    a. 02/2013 Echo: EF 35% inflat AK, mod to sev calcified MV annulus w/o MS, mod dil LA, PASP .  Marland Kitchen Coronary artery disease    a. 1996 s/p PS BMS;  b. 03/2007 NSTEMI/PCI: 3.0x24 and 3.0x28 Taxus DES' t RCA; c. 05/2008 CABG x 2 with MV repair and Maze (LIMA->LAD, VG->PDA)  . Depression   . High risk medication use    Tikosyn  . Hyperlipidemia   . Hypertension   . IDA (iron deficiency anemia)   . LBBB (left bundle branch block)   . Mitral insufficiency    a. 05/2008 s/p MV repair with  [employer] MV ring (model 5200, 36mm ser # [id number].  . Subarachnoid hemorrhage (HCC)    a. in setting of syncope/fall/digoxin toxicity 11/2007.  [facility] [place])    a. 05/2008 s/p MDT P1501DR Enrhythm DC PPM, ser # [id number].    Past Surgical History:  Procedure Laterality Date  . CARDIAC CATHETERIZATION  03/19/2007   EF 45%. THERE IS 2+ MITRAL INSUFFICIENCY. THERE IS MODERATE INFERIOR WALL HYPOKINESIA WITH OVERALL MILD TO MODERATE LV DYSFUNCTION  . CORONARY ARTERY BYPASS GRAFT  04/2008   INCLUDING A MITRAL VALVE REPAIR AND A LEFT SIDED MAZE PROCEDURE  . CORONARY ARTERY BYPASS GRAFT     X2 with LIMA to LAD and SVG to PD and WITH A LEFT SIDED MAZE PROCEDURE & MITRAL VALVE REPAIR. THIS INCLUDED AN LIMA GRAFT TO THE LAD, AND A SAPHENOUS VEIN GRAFT TO THE PDA.  Marland Kitchen CORONARY STENT PLACEMENT  1996, AND 03/2007   RIGHT CORONARY  . EP IMPLANTABLE DEVICE N/A 10/16/2016   Procedure: PPM Generator Changeout;  Surgeon: Hillis Range, MD;  [facility];  Service: Cardiovascular;  Laterality: N/A;  . MITRAL VALVE REPAIR    . PACEMAKER INSERTION  05/19/2008   MDT EnRhythm implanted for mobitz II AV block by Dr Reyes Ivan  . REMOVAL OF COLON POLYP     AND A LARGE CECAL POLYP  .  TRANSTHORACIC ECHOCARDIOGRAM  06/21/2008   EF 35%    Current Outpatient Medications  Medication Sig Dispense Refill  . albuterol (PROVENTIL HFA;VENTOLIN HFA) 108 (90 Base) MCG/ACT inhaler Inhale 1-2 puffs into the lungs every 6 (six) hours as needed for wheezing or shortness of breath. 1 Inhaler 0  . azithromycin (ZITHROMAX) 250 MG tablet Take 1 tablet (250 mg total) by mouth daily. Take first 2 tablets together, then 1 every day until finished. 6 tablet 0  . Cholecalciferol (VITAMIN D) 2000 UNITS tablet Take 2,000 Units by mouth daily.      Marland Kitchen dofetilide (TIKOSYN) 250 MCG capsule TAKE 1 CAPSULE (250 MCG) BY MOUTH TWICE DAILY 180 capsule 0  . furosemide (LASIX) 20 MG tablet TAKE 2 TABLETS BY MOUTH  EVERY DAY 180 tablet 3  . metoprolol tartrate (LOPRESSOR) 25 MG tablet TAKE 1/2 TABLET TWICE A DAY 90 tablet 1  . pantoprazole (PROTONIX) 40 MG tablet TAKE 1 TABLET (40 MG TOTAL) BY MOUTH DAILY. 90 tablet 0  . potassium chloride SA (K-DUR,KLOR-CON) 20 MEQ tablet Take 1 tablet (20 mEq total) by mouth 2 (two) times daily. (Patient not taking: Reported on 12/18/2017) 60 tablet 11  . predniSONE (DELTASONE) 10 MG tablet Take 1 tablet (10 mg total) by mouth daily with breakfast. 2 tablets for 5 days, 1 tablet for 5 days, 1/2 tablet for 5 days 18 tablet 0   No current facility-administered medications for this visit.     Allergies:   Amoxicillin; Amiodarone; Atorvastatin; Atorvastatin calcium; Diphenhydramine hcl; and Rosuvastatin   Social History:  The patient  reports that she has quit smoking. Her smoking use included cigarettes. She has a 30.00 pack-year smoking history. She has never used smokeless tobacco. She reports that she does not drink alcohol or use drugs.   Family History:  The patient's  family history includes Cancer in her father; Heart attack in her mother.   ROS:  Please see the history of present illness.   All other systems are personally reviewed and negative.    Exam:    Vital Signs:  There were no vitals taken for this visit.  Well sounding, she has chronic confusion but seems pleasant, conversant and understanding today   Labs/Other Tests and Data Reviewed:    Recent Labs: 02/24/2018: B Natriuretic Peptide 717.6; BUN 8; Creatinine, Ser 0.84; Hemoglobin 14.7; Platelets 253; Potassium 3.3; Sodium 142   Wt Readings from Last 3 Encounters:  12/18/17 151 lb (68.5 kg)  02/25/17 158 lb 12.8 oz (72 kg)  10/16/16 157 lb (71.2 kg)     Other studies personally reviewed: Additional studies/ records that were reviewed today include: my prior office notes,  Prior device interrogation  12/2017     ASSESSMENT & PLAN:    1. Sinus bradycardia She does not participate in  remote monitoring Asymptomatic Will plan to have her return in 6 months (if COVID 19 restrictions have been lifted then) for in office follow-up/ device interrogation with EP PA She has a recall device.  We will need to install software update which is now available when she returns.  2. afib Well controlled previously with Joice Lofts Overdue for labs, ekg Will obtain after COVID 19 restrictions have been lifted (6 months with EP PA) Poor candidate for anticoagulation  3. HTN Stable No change required today  4. CAD No ischemic symptoms   5. COVID 19 screen The patient denies symptoms of COVID 19 at this time.  The importance of social distancing was discussed today.  Follow-up: 6 months with EP PA if COVID 19 has passed   Patient Risk:  after full review of this patients clinical status, I feel that they are at moderate risk at this time.  Today, I have spent 12 minutes with the patient with telehealth technology discussing afib, HTN .    Randolm IdolSigned, Shanaye Rief, MD  01/28/2019 1:17 PM     [facility] [address] [place] [address] [phone] (office) [phone] (fax)

## 2019-03-10 ENCOUNTER — Other Ambulatory Visit: Payer: Self-pay | Admitting: Internal Medicine

## 2019-07-14 ENCOUNTER — Other Ambulatory Visit: Payer: Self-pay

## 2019-07-14 ENCOUNTER — Encounter (HOSPITAL_COMMUNITY): Payer: Self-pay | Admitting: Pharmacy Technician

## 2019-07-14 ENCOUNTER — Emergency Department (HOSPITAL_COMMUNITY): Payer: Medicare Other

## 2019-07-14 ENCOUNTER — Observation Stay (HOSPITAL_COMMUNITY)
Admission: EM | Admit: 2019-07-14 | Discharge: 2019-08-09 | Disposition: E | Payer: Medicare Other | Attending: Internal Medicine | Admitting: Internal Medicine

## 2019-07-14 DIAGNOSIS — Z66 Do not resuscitate: Secondary | ICD-10-CM | POA: Diagnosis not present

## 2019-07-14 DIAGNOSIS — I4891 Unspecified atrial fibrillation: Secondary | ICD-10-CM | POA: Insufficient documentation

## 2019-07-14 DIAGNOSIS — Z955 Presence of coronary angioplasty implant and graft: Secondary | ICD-10-CM | POA: Insufficient documentation

## 2019-07-14 DIAGNOSIS — R0602 Shortness of breath: Secondary | ICD-10-CM | POA: Diagnosis present

## 2019-07-14 DIAGNOSIS — Z888 Allergy status to other drugs, medicaments and biological substances status: Secondary | ICD-10-CM | POA: Insufficient documentation

## 2019-07-14 DIAGNOSIS — E785 Hyperlipidemia, unspecified: Secondary | ICD-10-CM | POA: Diagnosis not present

## 2019-07-14 DIAGNOSIS — I5022 Chronic systolic (congestive) heart failure: Secondary | ICD-10-CM | POA: Insufficient documentation

## 2019-07-14 DIAGNOSIS — Z87891 Personal history of nicotine dependence: Secondary | ICD-10-CM | POA: Insufficient documentation

## 2019-07-14 DIAGNOSIS — I252 Old myocardial infarction: Secondary | ICD-10-CM | POA: Diagnosis not present

## 2019-07-14 DIAGNOSIS — I2510 Atherosclerotic heart disease of native coronary artery without angina pectoris: Secondary | ICD-10-CM | POA: Insufficient documentation

## 2019-07-14 DIAGNOSIS — Z8249 Family history of ischemic heart disease and other diseases of the circulatory system: Secondary | ICD-10-CM | POA: Diagnosis not present

## 2019-07-14 DIAGNOSIS — Z951 Presence of aortocoronary bypass graft: Secondary | ICD-10-CM | POA: Insufficient documentation

## 2019-07-14 DIAGNOSIS — I110 Hypertensive heart disease with heart failure: Secondary | ICD-10-CM | POA: Diagnosis not present

## 2019-07-14 DIAGNOSIS — J9601 Acute respiratory failure with hypoxia: Secondary | ICD-10-CM | POA: Diagnosis present

## 2019-07-14 DIAGNOSIS — Z950 Presence of cardiac pacemaker: Secondary | ICD-10-CM | POA: Insufficient documentation

## 2019-07-14 DIAGNOSIS — Z880 Allergy status to penicillin: Secondary | ICD-10-CM | POA: Diagnosis not present

## 2019-07-14 DIAGNOSIS — Z20828 Contact with and (suspected) exposure to other viral communicable diseases: Secondary | ICD-10-CM | POA: Insufficient documentation

## 2019-07-14 DIAGNOSIS — Z8774 Personal history of (corrected) congenital malformations of heart and circulatory system: Secondary | ICD-10-CM | POA: Insufficient documentation

## 2019-07-14 DIAGNOSIS — Z7901 Long term (current) use of anticoagulants: Secondary | ICD-10-CM | POA: Diagnosis not present

## 2019-07-14 DIAGNOSIS — Z515 Encounter for palliative care: Secondary | ICD-10-CM | POA: Diagnosis not present

## 2019-07-14 DIAGNOSIS — J189 Pneumonia, unspecified organism: Principal | ICD-10-CM | POA: Insufficient documentation

## 2019-07-14 DIAGNOSIS — A419 Sepsis, unspecified organism: Secondary | ICD-10-CM | POA: Insufficient documentation

## 2019-07-14 DIAGNOSIS — Z79899 Other long term (current) drug therapy: Secondary | ICD-10-CM | POA: Diagnosis not present

## 2019-07-14 DIAGNOSIS — R4182 Altered mental status, unspecified: Secondary | ICD-10-CM | POA: Diagnosis not present

## 2019-07-14 LAB — POCT I-STAT EG7
Acid-base deficit: 10 mmol/L — ABNORMAL HIGH (ref 0.0–2.0)
Bicarbonate: 18.2 mmol/L — ABNORMAL LOW (ref 20.0–28.0)
Calcium, Ion: 1.16 mmol/L (ref 1.15–1.40)
HCT: 47 % — ABNORMAL HIGH (ref 36.0–46.0)
Hemoglobin: 16 g/dL — ABNORMAL HIGH (ref 12.0–15.0)
O2 Saturation: 61 %
Potassium: 4.4 mmol/L (ref 3.5–5.1)
Sodium: 148 mmol/L — ABNORMAL HIGH (ref 135–145)
TCO2: 20 mmol/L — ABNORMAL LOW (ref 22–32)
pCO2, Ven: 45.1 mmHg (ref 44.0–60.0)
pH, Ven: 7.213 — ABNORMAL LOW (ref 7.250–7.430)
pO2, Ven: 38 mmHg (ref 32.0–45.0)

## 2019-07-14 LAB — CBC
HCT: 52.2 % — ABNORMAL HIGH (ref 36.0–46.0)
Hemoglobin: 16.6 g/dL — ABNORMAL HIGH (ref 12.0–15.0)
MCH: 32.2 pg (ref 26.0–34.0)
MCHC: 31.8 g/dL (ref 30.0–36.0)
MCV: 101.4 fL — ABNORMAL HIGH (ref 80.0–100.0)
Platelets: 210 10*3/uL (ref 150–400)
RBC: 5.15 MIL/uL — ABNORMAL HIGH (ref 3.87–5.11)
RDW: 15 % (ref 11.5–15.5)
WBC: 28.3 10*3/uL — ABNORMAL HIGH (ref 4.0–10.5)
nRBC: 0 % (ref 0.0–0.2)

## 2019-07-14 LAB — URINALYSIS, ROUTINE W REFLEX MICROSCOPIC
Bilirubin Urine: NEGATIVE
Glucose, UA: NEGATIVE mg/dL
Ketones, ur: 20 mg/dL — AB
Leukocytes,Ua: NEGATIVE
Nitrite: NEGATIVE
Protein, ur: 100 mg/dL — AB
Specific Gravity, Urine: 1.018 (ref 1.005–1.030)
pH: 5 (ref 5.0–8.0)

## 2019-07-14 LAB — COMPREHENSIVE METABOLIC PANEL
ALT: UNDETERMINED U/L (ref 0–44)
AST: UNDETERMINED U/L (ref 15–41)
Albumin: 3.6 g/dL (ref 3.5–5.0)
Alkaline Phosphatase: 71 U/L (ref 38–126)
Anion gap: 23 — ABNORMAL HIGH (ref 5–15)
BUN: 43 mg/dL — ABNORMAL HIGH (ref 8–23)
CO2: 13 mmol/L — ABNORMAL LOW (ref 22–32)
Calcium: 9.7 mg/dL (ref 8.9–10.3)
Chloride: 110 mmol/L (ref 98–111)
Creatinine, Ser: 1.18 mg/dL — ABNORMAL HIGH (ref 0.44–1.00)
GFR calc Af Amer: 48 mL/min — ABNORMAL LOW (ref >60)
GFR calc non Af Amer: 41 mL/min — ABNORMAL LOW (ref >60)
Glucose, Bld: 81 mg/dL (ref 70–99)
Potassium: 4.6 mmol/L (ref 3.5–5.1)
Sodium: 146 mmol/L — ABNORMAL HIGH (ref 135–145)
Total Bilirubin: UNDETERMINED mg/dL (ref 0.3–1.2)
Total Protein: 7.4 g/dL (ref 6.5–8.1)

## 2019-07-14 LAB — POCT I-STAT 7, (LYTES, BLD GAS, ICA,H+H)
Acid-base deficit: 9 mmol/L — ABNORMAL HIGH (ref 0.0–2.0)
Bicarbonate: 18.3 mmol/L — ABNORMAL LOW (ref 20.0–28.0)
Calcium, Ion: 1.24 mmol/L (ref 1.15–1.40)
HCT: 40 % (ref 36.0–46.0)
Hemoglobin: 13.6 g/dL (ref 12.0–15.0)
O2 Saturation: 96 %
Patient temperature: 96
Potassium: 3.6 mmol/L (ref 3.5–5.1)
Sodium: 149 mmol/L — ABNORMAL HIGH (ref 135–145)
TCO2: 20 mmol/L — ABNORMAL LOW (ref 22–32)
pCO2 arterial: 42.8 mmHg (ref 32.0–48.0)
pH, Arterial: 7.231 — ABNORMAL LOW (ref 7.350–7.450)
pO2, Arterial: 88 mmHg (ref 83.0–108.0)

## 2019-07-14 LAB — CBG MONITORING, ED: Glucose-Capillary: 74 mg/dL (ref 70–99)

## 2019-07-14 LAB — CK: Total CK: 788 U/L — ABNORMAL HIGH (ref 38–234)

## 2019-07-14 LAB — LACTIC ACID, PLASMA: Lactic Acid, Venous: 4.1 mmol/L (ref 0.5–1.9)

## 2019-07-14 LAB — SARS CORONAVIRUS 2 BY RT PCR (HOSPITAL ORDER, PERFORMED IN ~~LOC~~ HOSPITAL LAB): SARS Coronavirus 2: NEGATIVE

## 2019-07-14 LAB — TROPONIN I (HIGH SENSITIVITY): Troponin I (High Sensitivity): 39 ng/L — ABNORMAL HIGH (ref <18)

## 2019-07-14 MED ORDER — SODIUM CHLORIDE 0.9% FLUSH: 3.0000 mL | INTRAVENOUS | Status: DC | PRN

## 2019-07-14 MED ORDER — LORAZEPAM 2 MG/ML PO CONC: 1.0000 mg | ORAL | Status: DC | PRN

## 2019-07-14 MED ORDER — SODIUM CHLORIDE 0.9 % IV SOLN: 250.0000 mL | INTRAVENOUS | Status: DC | PRN

## 2019-07-14 MED ORDER — HALOPERIDOL LACTATE 5 MG/ML IJ SOLN: 0.5000 mg | INTRAMUSCULAR | Status: DC | PRN

## 2019-07-14 MED ORDER — SODIUM CHLORIDE 0.9 % IV SOLN
1.0000 g | Freq: Once | INTRAVENOUS | Status: AC
  Administered 2019-07-14: 1 g via INTRAVENOUS
  Filled 2019-07-14: qty 10

## 2019-07-14 MED ORDER — SODIUM CHLORIDE 0.9 % IV SOLN: 500.0000 mg | Freq: Once | INTRAVENOUS | Status: DC

## 2019-07-14 MED ORDER — GLYCOPYRROLATE 1 MG PO TABS
1.0000 mg | ORAL_TABLET | ORAL | Status: DC | PRN
  Filled 2019-07-14: qty 1

## 2019-07-14 MED ORDER — ONDANSETRON HCL 4 MG/2ML IJ SOLN: 4.0000 mg | Freq: Four times a day (QID) | INTRAMUSCULAR | Status: DC | PRN

## 2019-07-14 MED ORDER — ACETAMINOPHEN 325 MG PO TABS: 650.0000 mg | ORAL_TABLET | Freq: Four times a day (QID) | ORAL | Status: DC | PRN

## 2019-07-14 MED ORDER — HALOPERIDOL 0.5 MG PO TABS
0.5000 mg | ORAL_TABLET | ORAL | Status: DC | PRN
  Filled 2019-07-14: qty 1

## 2019-07-14 MED ORDER — SODIUM CHLORIDE 0.9% FLUSH
3.0000 mL | Freq: Two times a day (BID) | INTRAVENOUS | Status: DC
  Administered 2019-07-14: 3 mL via INTRAVENOUS

## 2019-07-14 MED ORDER — LORAZEPAM 2 MG/ML IJ SOLN: 1.0000 mg | INTRAMUSCULAR | Status: DC | PRN

## 2019-07-14 MED ORDER — MORPHINE SULFATE (CONCENTRATE) 10 MG/0.5ML PO SOLN: 5.0000 mg | ORAL | Status: DC | PRN

## 2019-07-14 MED ORDER — ACETAMINOPHEN 650 MG RE SUPP: 650.0000 mg | Freq: Four times a day (QID) | RECTAL | Status: DC | PRN

## 2019-07-14 MED ORDER — LORAZEPAM 1 MG PO TABS: 1.0000 mg | ORAL_TABLET | ORAL | Status: DC | PRN

## 2019-07-14 MED ORDER — LORAZEPAM 2 MG/ML IJ SOLN
1.0000 mg | INTRAMUSCULAR | Status: DC | PRN
  Administered 2019-07-15: 1 mg via INTRAVENOUS
  Filled 2019-07-14: qty 1

## 2019-07-14 MED ORDER — HALOPERIDOL LACTATE 2 MG/ML PO CONC
0.5000 mg | ORAL | Status: DC | PRN
  Filled 2019-07-14: qty 0.3

## 2019-07-14 MED ORDER — SODIUM CHLORIDE 0.9 % IV BOLUS
1000.0000 mL | Freq: Once | INTRAVENOUS | Status: AC
  Administered 2019-07-14: 1000 mL via INTRAVENOUS

## 2019-07-14 MED ORDER — MORPHINE SULFATE (PF) 2 MG/ML IV SOLN: 1.0000 mg | INTRAVENOUS | Status: DC | PRN

## 2019-07-14 MED ORDER — POLYVINYL ALCOHOL 1.4 % OP SOLN
1.0000 [drp] | Freq: Four times a day (QID) | OPHTHALMIC | Status: DC | PRN
  Filled 2019-07-14: qty 15

## 2019-07-14 MED ORDER — GLYCOPYRROLATE 0.2 MG/ML IJ SOLN
0.2000 mg | INTRAMUSCULAR | Status: DC | PRN
  Administered 2019-07-15: 0.2 mg via INTRAVENOUS
  Filled 2019-07-14: qty 1

## 2019-07-14 MED ORDER — GLYCOPYRROLATE 0.2 MG/ML IJ SOLN: 0.2000 mg | INTRAMUSCULAR | Status: DC | PRN

## 2019-07-14 MED ORDER — LOPERAMIDE HCL 2 MG PO CAPS: 2.0000 mg | ORAL_CAPSULE | ORAL | Status: DC | PRN

## 2019-07-14 MED ORDER — ONDANSETRON 4 MG PO TBDP: 4.0000 mg | ORAL_TABLET | Freq: Four times a day (QID) | ORAL | Status: DC | PRN

## 2019-07-14 MED ORDER — BIOTENE DRY MOUTH MT LIQD: 15.0000 mL | OROMUCOSAL | Status: DC | PRN

## 2019-07-14 NOTE — ED Notes (Signed)
Son remains at the bedside

## 2019-07-14 NOTE — ED Provider Notes (Signed)
[facility] Emergency Department Provider Note MRN:  [id number]  Arrival date & time: 08/11/2019     Chief Complaint   Found down History of Present Illness   [name] is a 85 y.o. year-old female with a history of A. fib, CHF, CAD, tobacco abuse presenting to the ED with chief complaint of found down.  I was unable to obtain an accurate HPI, PMH, or ROS due to the patient's altered mental status.  Level 5 caveat.  Visited by family and found on the ground, had soiled herself, altered, unknown amount of time on the ground.  Hypoxia noted by EMS.  Review of Systems  Positive for altered mental status, found on the ground.  Patient's Health History    Past Medical History:  Diagnosis Date   Anxiety    Atrial fibrillation (HCC)    a. in sinus on Tikosyn and coumadin;  b. s/p MAZE @ time of MVR/CABG 05/2008;  c. h/o pulm toxicity while on amio.   Chronic anticoagulation    coumadin   Chronic systolic CHF (congestive heart failure) (HCC)    a. 02/2013 Echo: EF 35% inflat AK, mod to sev calcified MV annulus w/o MS, mod dil LA, PASP .   Coronary artery disease    a. 1996 s/p PS BMS;  b. 03/2007 NSTEMI/PCI: 3.0x24 and 3.0x28 Taxus DES' t RCA; c. 05/2008 CABG x 2 with MV repair and Maze (LIMA->LAD, VG->PDA)   Depression    High risk medication use    Tikosyn   Hyperlipidemia    Hypertension    IDA (iron deficiency anemia)    LBBB (left bundle branch block)    Mitral insufficiency    a. 05/2008 s/p MV repair with [employer] MV ring (model 5200, 41mm ser # [id number].   Subarachnoid hemorrhage (HCC)    a. in setting of syncope/fall/digoxin toxicity 11/2007.   Tachycardia-bradycardia [facility])    a. 05/2008 s/p MDT P1501DR Enrhythm DC PPM, ser # [id number].    Past Surgical History:  Procedure Laterality Date   CARDIAC CATHETERIZATION  03/19/2007   EF 45%. THERE IS 2+ MITRAL INSUFFICIENCY. THERE IS MODERATE INFERIOR WALL HYPOKINESIA  WITH OVERALL MILD TO MODERATE LV DYSFUNCTION   CORONARY ARTERY BYPASS GRAFT  04/2008   INCLUDING A MITRAL VALVE REPAIR AND A LEFT SIDED MAZE PROCEDURE   CORONARY ARTERY BYPASS GRAFT     X2 with LIMA to LAD and SVG to PD and WITH A LEFT SIDED MAZE PROCEDURE & MITRAL VALVE REPAIR. THIS INCLUDED AN LIMA GRAFT TO THE LAD, AND A SAPHENOUS VEIN GRAFT TO THE PDA.   CORONARY STENT PLACEMENT  1996, AND 03/2007   RIGHT CORONARY   EP IMPLANTABLE DEVICE N/A 10/16/2016   Procedure: PPM Generator Changeout;  Surgeon: Hillis Range, MD;  [facility];  Service: Cardiovascular;  Laterality: N/A;   MITRAL VALVE REPAIR     PACEMAKER INSERTION  05/19/2008   MDT EnRhythm implanted for mobitz II AV block by Dr Reyes Ivan   REMOVAL OF COLON POLYP     AND A LARGE CECAL POLYP   TRANSTHORACIC ECHOCARDIOGRAM  06/21/2008   EF 35%    Family History  Problem Relation Age of Onset   Heart attack Mother    Cancer Father     Social History   Socioeconomic History   Marital status: Widowed    Spouse name: Not on file   Number of children: Not on file   Years of education: Not on  file   Highest education level: Not on file  Occupational History   Not on file  Social Needs   Financial resource strain: Not on file   Food insecurity    Worry: Not on file    Inability: Not on file   Transportation needs    Medical: Not on file    Non-medical: Not on file  Tobacco Use   Smoking status: Former Smoker    Packs/day: 0.50    Years: 60.00    Pack years: 30.00    Types: Cigarettes   Smokeless tobacco: Never Used  Substance and Sexual Activity   Alcohol use: No   Drug use: No   Sexual activity: Never  Lifestyle   Physical activity    Days per week: Not on file    Minutes per session: Not on file   Stress: Not on file  Relationships   Social connections    Talks on phone: Not on file    Gets together: Not on file    Attends religious service: Not on file    Active member  of club or organization: Not on file    Attends meetings of clubs or organizations: Not on file    Relationship status: Not on file   Intimate partner violence    Fear of current or ex partner: Not on file    Emotionally abused: Not on file    Physically abused: Not on file    Forced sexual activity: Not on file  Other Topics Concern   Not on file  Social History Narrative   Not on file     Physical Exam  Vital Signs and Nursing Notes reviewed Vitals:   14-Aug-2019 2000 14-Aug-2019 2030  BP: 116/62 (!) 106/57  Pulse: (!) 27 (!) 29  Resp:    Temp:    SpO2: (!) 83% (!) 83%    CONSTITUTIONAL: Ill-appearing, moderate respiratory distress, soiled, appears dehydrated NEURO: Awake, not following commands, moving all extremities EYES:  eyes equal and reactive ENT/NECK:  no LAD, no JVD CARDIO: Tachycardic rate, well-perfused, normal S1 and S2 PULM: Diffuse rhonchi GI/GU:  normal bowel sounds, non-distended, non-tender MSK/SPINE:  No gross deformities, no edema SKIN:  no rash, atraumatic PSYCH:  Appropriate speech and behavior  Diagnostic and Interventional Summary    EKG Interpretation  Date/Time:  Tuesday July 14 2019 18:03:31 EDT Ventricular Rate:  135 PR Interval:    QRS Duration: 165 QT Interval:  368 QTC Calculation: 552 R Axis:   100 Text Interpretation: Sinus tachycardia, left bundle branch block confirmed by Kennis CarinaBero, Donzella Carrol [phone]) on 07/13/2019 6:21:37 PM      Labs Reviewed  CBC - Abnormal; Notable for the following components:      Result Value   WBC 28.3 (*)    RBC 5.15 (*)    Hemoglobin 16.6 (*)    HCT 52.2 (*)    MCV 101.4 (*)    All other components within normal limits  COMPREHENSIVE METABOLIC PANEL - Abnormal; Notable for the following components:   Sodium 146 (*)    CO2 13 (*)    BUN 43 (*)    Creatinine, Ser 1.18 (*)    GFR calc non Af Amer 41 (*)    GFR calc Af Amer 48 (*)    Anion gap 23 (*)    All other components within normal limits    LACTIC ACID, PLASMA - Abnormal; Notable for the following components:   Lactic Acid, Venous 4.1 (*)  All other components within normal limits  URINALYSIS, ROUTINE W REFLEX MICROSCOPIC - Abnormal; Notable for the following components:   APPearance CLOUDY (*)    Hgb urine dipstick SMALL (*)    Ketones, ur 20 (*)    Protein, ur 100 (*)    Bacteria, UA RARE (*)    All other components within normal limits  CK - Abnormal; Notable for the following components:   Total CK 788 (*)    All other components within normal limits  POCT I-STAT EG7 - Abnormal; Notable for the following components:   pH, Ven 7.213 (*)    Bicarbonate 18.2 (*)    TCO2 20 (*)    Acid-base deficit 10.0 (*)    Sodium 148 (*)    HCT 47.0 (*)    Hemoglobin 16.0 (*)    All other components within normal limits  POCT I-STAT 7, (LYTES, BLD GAS, ICA,H+H) - Abnormal; Notable for the following components:   pH, Arterial 7.231 (*)    Bicarbonate 18.3 (*)    TCO2 20 (*)    Acid-base deficit 9.0 (*)    Sodium 149 (*)    All other components within normal limits  TROPONIN I (HIGH SENSITIVITY) - Abnormal; Notable for the following components:   Troponin I (High Sensitivity) 39 (*)    All other components within normal limits  SARS CORONAVIRUS 2 (HOSPITAL ORDER, PERFORMED IN [place])  CULTURE, BLOOD (ROUTINE X 2)  CULTURE, BLOOD (ROUTINE X 2)  LACTIC ACID, PLASMA  BLOOD GAS, ARTERIAL  CBG MONITORING, ED    CT HEAD WO CONTRAST  Final Result    CT CERVICAL SPINE WO CONTRAST  Final Result    DG Chest Port 1 View  Final Result    DG Pelvis Portable  Final Result      Medications  LORazepam (ATIVAN) tablet 1 mg (has no administration in time range)    Or  LORazepam (ATIVAN) 2 MG/ML concentrated solution 1 mg (has no administration in time range)    Or  LORazepam (ATIVAN) injection 1 mg (has no administration in time range)  sodium chloride 0.9 % bolus 1,000 mL (1,000 mLs Intravenous New  Bag/Given 08/08/2019 1846)  sodium chloride 0.9 % bolus 1,000 mL (1,000 mLs Intravenous New Bag/Given Aug 08, 2019 1855)  cefTRIAXone (ROCEPHIN) 1 g in sodium chloride 0.9 % 100 mL IVPB (1 g Intravenous New Bag/Given August 08, 2019 1855)     Ultrasound ED Peripheral IV (Provider)  Date/Time: 08-08-2019 6:22 PM Performed by: Sabas Sous, MD Authorized by: Sabas Sous, MD   Procedure details:    Indications: hydration, multiple failed IV attempts and poor IV access     Skin Prep: chlorhexidine gluconate     Location: Left basilic vein.   Angiocath:  20 G   Bedside Ultrasound Guided: Yes     Images: not archived     Patient tolerated procedure without complications: Yes     Dressing applied: Yes     Critical Care Critical Care Documentation Critical care time provided by me (excluding procedures): 45 minutes  Condition necessitating critical care: Hypoxic respiratory failure, sepsis  Components of critical care management: reviewing of prior records, laboratory and imaging interpretation, frequent re-examination and reassessment of vital signs, administration of IV fluids, IV antibiotics, airway management, discussion with consulting services, discussions with family regarding end-of-life care.    ED Course and Medical Decision Making  I have reviewed the triage vital signs and the nursing notes.  Pertinent labs &  imaging results that were available during my care of the patient were reviewed by me and considered in my medical decision making (see below for details).  Found down, appears severely dehydrated, heart rate 130s, no obvious trauma on exam, EKG with left bundle branch block, large amount of discordant change, consulted cardiology to ensure this is not concerning for acute MI, they agree that there is no need for emergent cardiology intervention at this time.  Providing IV fluids.  Patient is hypoxic, 88% on nonrebreather.  Was able to contact patient's son, who confirms the  patient is DNR/DNI and is not interested in heroic measures.  Will move forward with high flow nasal cannula and obtain further testing.  7:45 PM update: Having difficult time obtaining accurate pulse ox, respiratory therapy consulted for ABG revealing PO2 of 88.  This is with 16 L high flow nasal cannula as well as bag-valve-mask.  Would expect PO2 to be much higher.  PF ratio 88 suggesting severe ARDS.  Patient's son will be arriving soon, planning to discuss end-of-life care.  8:45 PM update: Patient's son at bedside, I discussed the patient's condition and very poor prognosis.  Patient's son explains that she has had poor quality of life for years and her wishes are clear, to not be kept alive any longer than she needs to be.  I thoroughly explained the management options ranging from ICU admission to full comfort care.  It became clear that the patient's wishes given the severity of illness is full comfort care.  Explained to patient's son that we will not be monitoring her vital signs, will not be taking any more blood tests, we will be focusing purely on her comfort and letting her die naturally.  Will admit to hospital service for further care.  Barth Kirks. Sedonia Small, [place] [email or web address]  Final Clinical Impressions(s) / ED Diagnoses     ICD-10-CM   1. Acute respiratory failure with hypoxia (HCC)  J96.01   2. SOB (shortness of breath)  R06.02 DG Chest [facility] 1 View    DG Chest [place] 1 View  3. Sepsis, due to unspecified organism, unspecified whether acute organ dysfunction present ([place])  A41.9   4. Community acquired pneumonia, unspecified laterality  J18.9     ED Discharge Orders    None      Discharge Instructions Discussed with and Provided to Patient: Discharge Instructions   None       Maudie Flakes, MD 07/24/2019 2054

## 2019-07-14 NOTE — ED Triage Notes (Signed)
Pt arrives [facility] from home where she was found down. lkw 3 days ago. Pt follows commands but not speaking. Pt HR 140, 84% NRB. EDP at bedside upon pt arrival.

## 2019-07-14 NOTE — ED Notes (Signed)
Pacer interrogated at this time

## 2019-07-14 NOTE — H&P (Signed)
History and Physical    [name] LPF:[id number] DOB: [birth year] DOA: Aug 05, 2019  PCP: Patient, No Pcp Per   Patient coming from: Home   Chief Complaint: Found down   HPI: [name] is a 85 y.o. female with medical history significant for atrial fibrillation, chronic systolic CHF, coronary artery disease, tachycardia-bradycardia syndrome with pacer, now presenting to the emergency department after she was found poorly responsive.  Patient is unable to contribute to the history due to her clinical condition, and her son at the bedside provides historical information.  Patient has reportedly been in poor and declining health for years, has made her end-of-life wishes clear to her children, including DNR/DNI.  She was reportedly last seen in her usual state approximately 3 days ago before she was found today having been incontinent and poorly responsive.  EMS was called out, found the patient to be hypoxic, and she was brought into the ED.  ED Course: Upon arrival to the ED, patient is found to be hypothermic, hypoxic, tachypneic, bradycardic, and with systolic blood pressure of 90.  EKG was concerning for possible acute MI.  Chest x-ray concerning for pneumonia.  Noncontrast head CT negative.  Chemistry panel with mild hyponatremia, bicarbonate of 13, and creatinine 1.18.  CBC notable for marked leukocytosis and polycythemia.  Lactic acid elevated just above 4.  Blood cultures were collected in the ED, patient was given IV fluids, Rocephin, and discussion was had at the bedside with her son who felt as though the patient would want [place] to focus on comfort only at this point.  Review of Systems:  Unable to complete ROS secondary to the patient's clinical condition.  Past Medical History:  Diagnosis Date   Anxiety    Atrial fibrillation ([place])    a. in sinus on Tikosyn and coumadin;  b. s/p MAZE @ time of MVR/CABG 05/2008;  c. h/o pulm toxicity while on amio.   Chronic anticoagulation     coumadin   Chronic systolic CHF (congestive heart failure) ([place])    a. 02/2013 Echo: EF 35% inflat AK, mod to sev calcified MV annulus w/o MS, mod dil LA, PASP 7mmHg.   Coronary artery disease    a. 1996 s/p PS BMS;  b. 03/2007 NSTEMI/PCI: 3.0x24 and 3.0x28 Taxus DES' t RCA; c. 05/2008 CABG x 2 with MV repair and Maze (LIMA->LAD, VG->PDA)   Depression    High risk medication use    Tikosyn   Hyperlipidemia    Hypertension    IDA (iron deficiency anemia)    LBBB (left bundle branch block)    Mitral insufficiency    a. 05/2008 s/p MV repair with [employer] MV ring (model 5200, 49mm ser # [id number].   Subarachnoid hemorrhage ([place])    a. in setting of syncope/fall/digoxin toxicity 11/2007.   Tachycardia-bradycardia [facility])    a. 05/2008 s/p MDT [id number] Enrhythm DC PPM, ser # [id number].    Past Surgical History:  Procedure Laterality Date   CARDIAC CATHETERIZATION  03/19/2007   EF 45%. THERE IS 2+ MITRAL INSUFFICIENCY. THERE IS MODERATE INFERIOR WALL HYPOKINESIA WITH OVERALL MILD TO MODERATE LV DYSFUNCTION   CORONARY ARTERY BYPASS GRAFT  04/2008   INCLUDING A MITRAL VALVE REPAIR AND A LEFT SIDED MAZE PROCEDURE   CORONARY ARTERY BYPASS GRAFT     X2 with LIMA to LAD and SVG to PD and WITH A LEFT SIDED MAZE PROCEDURE & MITRAL VALVE REPAIR. THIS INCLUDED AN LIMA GRAFT TO THE LAD, AND A SAPHENOUS VEIN GRAFT  TO THE PDA.   CORONARY STENT PLACEMENT  1996, AND 03/2007   RIGHT CORONARY   EP IMPLANTABLE DEVICE N/A 10/16/2016   Procedure: PPM Generator Changeout;  Surgeon: Hillis Range, MD;  Location: [facility] INVASIVE [facility];  Service: Cardiovascular;  Laterality: N/A;   MITRAL VALVE REPAIR     PACEMAKER INSERTION  05/19/2008   MDT EnRhythm implanted for mobitz II AV block by Dr Reyes Ivan   REMOVAL OF COLON POLYP     AND A LARGE CECAL POLYP   TRANSTHORACIC ECHOCARDIOGRAM  06/21/2008   EF 35%     reports that she has quit smoking. Her smoking use included cigarettes. She has a 30.00  pack-year smoking history. She has never used smokeless tobacco. She reports that she does not drink alcohol or use drugs.  Allergies  Allergen Reactions   Amoxicillin Other (See Comments)    REACTION: "made flush...cheeks red"   Amiodarone     unknown   Atorvastatin     Myalgia   Atorvastatin Calcium     Myalgia   Diphenhydramine Hcl Other (See Comments)    unknown   Rosuvastatin     myalgia    Family History  Problem Relation Age of Onset   Heart attack Mother    Cancer Father      Prior to Admission medications   Medication Sig Start Date End Date Taking? Authorizing Provider  albuterol (PROVENTIL HFA;VENTOLIN HFA) 108 (90 Base) MCG/ACT inhaler Inhale 1-2 puffs into the lungs every 6 (six) hours as needed for wheezing or shortness of breath. 02/24/18   Donnetta Hutching, MD  azithromycin (ZITHROMAX) 250 MG tablet Take 1 tablet (250 mg total) by mouth daily. Take first 2 tablets together, then 1 every day until finished. 02/24/18   Donnetta Hutching, MD  Cholecalciferol (VITAMIN D) 2000 UNITS tablet Take 2,000 Units by mouth daily.      [provider]  dofetilide (TIKOSYN) 250 MCG capsule TAKE 1 CAPSULE (250 MCG) BY MOUTH TWICE DAILY 01/01/19   Allred, Fayrene Fearing, MD  furosemide (LASIX) 20 MG tablet TAKE 2 TABLETS BY MOUTH EVERY DAY 11/10/18   Allred, Fayrene Fearing, MD  metoprolol tartrate (LOPRESSOR) 25 MG tablet TAKE 1/2 TABLET TWICE A DAY 06/23/18   Allred, Fayrene Fearing, MD  pantoprazole (PROTONIX) 40 MG tablet TAKE 1 TABLET BY MOUTH EVERY DAY 03/10/19   Allred, Fayrene Fearing, MD  potassium chloride SA (K-DUR,KLOR-CON) 20 MEQ tablet Take 1 tablet (20 mEq total) by mouth 2 (two) times daily. Patient not taking: Reported on 12/18/2017 11/21/15   Hillis Range, MD  predniSONE (DELTASONE) 10 MG tablet Take 1 tablet (10 mg total) by mouth daily with breakfast. 2 tablets for 5 days, 1 tablet for 5 days, 1/2 tablet for 5 days 02/24/18   Donnetta Hutching, MD    Physical Exam: Vitals:   07/18/2019 1945 08/02/2019 1946  07/13/2019 2000 08/07/2019 2030  BP:  (!) 135/45 116/62 (!) 106/57  Pulse: (!) 59 (!) 32 (!) 27 (!) 29  Resp: (!) 21 (!) 32    Temp:      TempSrc:      SpO2: (!) 84% (!) 85% (!) 83% (!) 83%    Constitutional: Cachectic, somnolent  Eyes: PERTLA, lids and conjunctivae normal ENMT: Mucous membranes are dry. Posterior pharynx clear of any exudate or lesions.   Neck:  supple, no masses, no thyromegaly Respiratory: Tachypnea. Rhonchi bilaterally. No pallor.   Cardiovascular: Rate ~100 and regular. No extremity edema.  Abdomen: No distension, no tenderness, soft. Bowel sounds  active.  Musculoskeletal: no clubbing / cyanosis. No joint deformity upper and lower extremities.   Skin: no significant rashes, lesions, ulcers. Warm, dry, well-perfused. Neurologic: No gross facial asymmetry. Moving all extremities.  Psychiatric: Somnolent. No intelligible speech.     Labs on Admission: I have personally reviewed following labs and imaging studies  CBC: Recent Labs  Lab 07/31/2019 1801 07/09/2019 1832 07/19/2019 1935  WBC 28.3*  --   --   HGB 16.6* 16.0* 13.6  HCT 52.2* 47.0* 40.0  MCV 101.4*  --   --   PLT 210  --   --    Basic Metabolic Panel: Recent Labs  Lab 07/10/2019 1801 07/24/2019 1832 08/02/2019 1935  NA 146* 148* 149*  K 4.6 4.4 3.6  CL 110  --   --   CO2 13*  --   --   GLUCOSE 81  --   --   BUN 43*  --   --   CREATININE 1.18*  --   --   CALCIUM 9.7  --   --    GFR: CrCl cannot be calculated (Unknown ideal weight.). Liver Function Tests: Recent Labs  Lab 07/20/2019 1801  AST QUANTITY NOT SUFFICIENT, UNABLE TO PERFORM TEST  ALT QUANTITY NOT SUFFICIENT, UNABLE TO PERFORM TEST  ALKPHOS 71  BILITOT QUANTITY NOT SUFFICIENT, UNABLE TO PERFORM TEST  PROT 7.4  ALBUMIN 3.6   No results for input(s): LIPASE, AMYLASE in the last 168 hours. No results for input(s): AMMONIA in the last 168 hours. Coagulation Profile: No results for input(s): INR, PROTIME in the last 168 hours. Cardiac  Enzymes: Recent Labs  Lab 07/11/2019 1801  CKTOTAL 788*   BNP (last 3 results) No results for input(s): PROBNP in the last 8760 hours. HbA1C: No results for input(s): HGBA1C in the last 72 hours. CBG: Recent Labs  Lab 07/24/2019 1955  GLUCAP 74   Lipid Profile: No results for input(s): CHOL, HDL, LDLCALC, TRIG, CHOLHDL, LDLDIRECT in the last 72 hours. Thyroid Function Tests: No results for input(s): TSH, T4TOTAL, FREET4, T3FREE, THYROIDAB in the last 72 hours. Anemia Panel: No results for input(s): VITAMINB12, FOLATE, FERRITIN, TIBC, IRON, RETICCTPCT in the last 72 hours. Urine analysis:    Component Value Date/Time   COLORURINE YELLOW 07/21/2019 1840   APPEARANCEUR CLOUDY (A) 08/07/2019 1840   LABSPEC 1.018 07/31/2019 1840   PHURINE 5.0 07/13/2019 1840   GLUCOSEU NEGATIVE 07/28/2019 1840   HGBUR SMALL (A) 07/31/2019 1840   BILIRUBINUR NEGATIVE 07/12/2019 1840   KETONESUR 20 (A) 07/12/2019 1840   PROTEINUR 100 (A) 07/27/2019 1840   UROBILINOGEN 0.2 11/23/2013 0759   NITRITE NEGATIVE 08/05/2019 1840   LEUKOCYTESUR NEGATIVE 07/18/2019 1840   Sepsis Labs: @LABRCNTIP (procalcitonin:4,lacticidven:4) ) Recent Results (from the past 240 hour(s))  SARS Coronavirus 2 [facility](Hospital order, Performed [facility]) Nasopharyngeal Nasopharyngeal Swab     Status: None   Collection Time: 07/25/2019  6:22 PM   Specimen: Nasopharyngeal Swab  Result Value Ref Range Status   SARS Coronavirus 2 NEGATIVE NEGATIVE Final    Comment: (NOTE) If result is NEGATIVE SARS-CoV-2 target nucleic acids are NOT DETECTED. The SARS-CoV-2 RNA is generally detectable in upper and lower  respiratory specimens during the acute phase of infection. The lowest  concentration of SARS-CoV-2 viral copies this assay can detect is 250  copies / mL. A negative result does not preclude SARS-CoV-2 infection  and should not be used as the sole basis for treatment or other  patient management decisions.  A  negative result may occur with  improper specimen collection / handling, submission of specimen other  than nasopharyngeal swab, presence of viral mutation(s) within the  areas targeted by this assay, and inadequate number of viral copies  (<250 copies / mL). A negative result must be combined with clinical  observations, patient history, and epidemiological information. If result is POSITIVE SARS-CoV-2 target nucleic acids are DETECTED. The SARS-CoV-2 RNA is generally detectable in upper and lower  respiratory specimens dur ing the acute phase of infection.  Positive  results are indicative of active infection with SARS-CoV-2.  Clinical  correlation with patient history and other diagnostic information is  necessary to determine patient infection status.  Positive results do  not rule out bacterial infection or co-infection with other viruses. If result is PRESUMPTIVE POSTIVE SARS-CoV-2 nucleic acids MAY BE PRESENT.   A presumptive positive result was obtained on the submitted specimen  and confirmed on repeat testing.  While 2019 novel coronavirus  (SARS-CoV-2) nucleic acids may be present in the submitted sample  additional confirmatory testing may be necessary for epidemiological  and / or clinical management purposes  to differentiate between  SARS-CoV-2 and other Sarbecovirus currently known to infect humans.  If clinically indicated additional testing with an alternate test  methodology [id number]) is advised. The SARS-CoV-2 RNA is generally  detectable in upper and lower respiratory sp ecimens during the acute  phase of infection. The expected result is Negative. Fact Sheet for Patients:  BoilerBrush.com.cy Fact Sheet for Healthcare Providers: https://pope.com/ This test is not yet approved or cleared by the [place] FDA and has been authorized for detection and/or diagnosis of SARS-CoV-2 by FDA under an Emergency Use  Authorization (EUA).  This EUA will remain in effect (meaning this test can be used) for the duration of the COVID-19 declaration under Section 564(b)(1) of the Act, 21 U.S.C. section 360bbb-3(b)(1), unless the authorization is terminated or revoked sooner. Performed at [facility], [address] [place], [place] [address]      Radiological Exams on Admission: Ct Head Wo Contrast  Result Date: 2019/07/22 CLINICAL DATA:  Fall.  Altered mental status. EXAM: CT HEAD WITHOUT CONTRAST CT CERVICAL SPINE WITHOUT CONTRAST TECHNIQUE: Multidetector CT imaging of the head and cervical spine was performed following the standard protocol without intravenous contrast. Multiplanar CT image reconstructions of the cervical spine were also generated. COMPARISON:  CT head dated December 04, 2007. FINDINGS: CT HEAD FINDINGS Brain: No evidence of acute infarction, hemorrhage, hydrocephalus, extra-axial collection or mass lesion/mass effect. Progressive moderate atrophy. Vascular: Atherosclerotic vascular calcification of the carotid siphons. No hyperdense vessel. Skull: Normal. Negative for fracture or focal lesion. Sinuses/Orbits: No acute finding. Diffuse paranasal sinus mucosal thickening. The mastoid air cells are clear. Other: None. CT CERVICAL SPINE FINDINGS Alignment: Normal. Skull base and vertebrae: No acute fracture. No primary bone lesion or focal pathologic process. Soft tissues and spinal canal: No prevertebral fluid or swelling. No visible canal hematoma. Disc levels: Multilevel spondylosis with at least mild spinal canal stenosis at C3-C4, C4-C5, and C5-C6. Moderate right greater than left neuroforaminal stenosis at C4-C5 and C5-C6 due to uncovertebral hypertrophy. Upper chest: Centrilobular emphysema. Other: Negative. IMPRESSION: 1.  No acute intracranial abnormality. 2.  No acute cervical spine fracture. 3.  Emphysema (ICD10-J43.9). Electronically Signed   By: Obie Dredge M.D.   On: 07/22/2019  19:58   Ct Cervical Spine Wo Contrast  Result Date: 07-22-2019 CLINICAL DATA:  Fall.  Altered mental status. EXAM:  CT HEAD WITHOUT CONTRAST CT CERVICAL SPINE WITHOUT CONTRAST TECHNIQUE: Multidetector CT imaging of the head and cervical spine was performed following the standard protocol without intravenous contrast. Multiplanar CT image reconstructions of the cervical spine were also generated. COMPARISON:  CT head dated December 04, 2007. FINDINGS: CT HEAD FINDINGS Brain: No evidence of acute infarction, hemorrhage, hydrocephalus, extra-axial collection or mass lesion/mass effect. Progressive moderate atrophy. Vascular: Atherosclerotic vascular calcification of the carotid siphons. No hyperdense vessel. Skull: Normal. Negative for fracture or focal lesion. Sinuses/Orbits: No acute finding. Diffuse paranasal sinus mucosal thickening. The mastoid air cells are clear. Other: None. CT CERVICAL SPINE FINDINGS Alignment: Normal. Skull base and vertebrae: No acute fracture. No primary bone lesion or focal pathologic process. Soft tissues and spinal canal: No prevertebral fluid or swelling. No visible canal hematoma. Disc levels: Multilevel spondylosis with at least mild spinal canal stenosis at C3-C4, C4-C5, and C5-C6. Moderate right greater than left neuroforaminal stenosis at C4-C5 and C5-C6 due to uncovertebral hypertrophy. Upper chest: Centrilobular emphysema. Other: Negative. IMPRESSION: 1.  No acute intracranial abnormality. 2.  No acute cervical spine fracture. 3.  Emphysema (ICD10-J43.9). Electronically Signed   By: Obie Dredge M.D.   On: 07/10/2019 19:58   Dg Pelvis Portable  Result Date: 07/26/2019 CLINICAL DATA:  85 year old who was found on the ground earlier today, low core body temperature, possible fall. EXAM: PORTABLE PELVIS 1-2 VIEWS COMPARISON:  None. FINDINGS: No evidence of acute fracture. Hip joints anatomically aligned with symmetric mild joint space narrowing. Sacroiliac joints  anatomically aligned with degenerative changes. Symphysis pubis anatomically aligned without degenerative changes. Mild degenerative changes involving the visualized lower lumbar spine. Aorto-iliofemoral atherosclerosis. IMPRESSION: 1. No acute osseous abnormality. 2. Symmetric mild osteoarthritis in both hips. Electronically Signed   By: Hulan Saas M.D.   On: 07/10/2019 18:53   Dg Chest Port 1 View  Result Date: 07/25/2019 CLINICAL DATA:  85 year old who was found on the ground earlier today, low core body temperature, with shortness of breath. EXAM: PORTABLE CHEST 1 VIEW COMPARISON:  02/24/2018 and earlier. FINDINGS: Prior sternotomy for CABG and aortic valve replacement. Cardiac silhouette upper normal in size to slightly enlarged for AP portable technique, unchanged. LEFT subclavian dual lead transvenous pacemaker unchanged and appears intact. Patchy ground-glass airspace opacities scattered throughout both lungs, predominantly in the RIGHT lung base. No confluent airspace consolidation. No visible pleural effusions. No pneumothorax. IMPRESSION: 1. Patchy ground-glass airspace opacities throughout both lungs, predominantly in the RIGHT lung base, likely indicating pneumonia. 2. Stable borderline to mild cardiomegaly without pulmonary edema. Electronically Signed   By: Hulan Saas M.D.   On: 08/05/2019 18:51    EKG: Independently reviewed. Sinus tachycardia (rate 135), PAC, ST-T abnormalities.   Assessment/Plan   1. End of life care  - Patient presents poorly responsive and hypoxic after last being seen 3 days earlier  - She appears to be septic from PNA, possibly complicated by acute MI  - Her son is at bedside and discussed with ED physician, and again with this writer, that the patient has been suffering with chronic health problems for years, had been clear in her desire for DNR/DNI status, and would want [place] to focus only on keeping her comfortable at this point  - Palliative  consultation requested, no further antibiotics or labs per discussion with son, continue full comfort-focused care    PPE: Mask, face shield  DVT prophylaxis: none  Code Status: DNR, full comfort care  Family Communication: Discussed with son, Zaniah [place],  at the bedside Consults called: None Admission status: Observation     Briscoe Deutscher, MD [facility] Pager [phone]  If 7PM-7AM, please contact night-coverage [email or web address] Password [email or web address]  07/24/2019, 9:11 PM

## 2019-07-15 DIAGNOSIS — J9601 Acute respiratory failure with hypoxia: Secondary | ICD-10-CM | POA: Diagnosis not present

## 2019-07-15 DIAGNOSIS — Z515 Encounter for palliative care: Secondary | ICD-10-CM | POA: Diagnosis not present

## 2019-07-15 DIAGNOSIS — A419 Sepsis, unspecified organism: Secondary | ICD-10-CM | POA: Diagnosis not present

## 2019-07-15 DIAGNOSIS — J189 Pneumonia, unspecified organism: Secondary | ICD-10-CM | POA: Diagnosis not present

## 2019-07-16 LAB — BLOOD CULTURE ID PANEL (REFLEXED)

## 2019-07-17 LAB — CULTURE, BLOOD (ROUTINE X 2): Special Requests: ADEQUATE

## 2019-07-19 LAB — CULTURE, BLOOD (ROUTINE X 2): Culture: NO GROWTH

## 2019-08-09 NOTE — Progress Notes (Signed)
Patients son will follow up with patient placement for the name of funeral home.

## 2019-08-09 NOTE — Progress Notes (Addendum)
Nutrition Brief Note  RD working remotely. Chart reviewed. Pt now transitioning to comfort care; per RN notes, pt has expired. No further nutrition interventions warranted at this time.   Neilah Fulwider A. Jimmye Norman, RD, LDN, [place] Registered Dietitian II Certified Diabetes Care and Education Specialist Pager: [phone] After hours Pager: [phone]

## 2019-08-09 NOTE — Death Summary Note (Signed)
Death Summary  Kim CroftBarbara [name] ZOX:[id number] DOB: 11-Nov-1929 DOA: 07/17/2019  PCP: Patient, No Pcp Per  Admit date: 07/19/2019 Date of Death: 07/10/2019 Time of Death: 04:45  Notification: Patient, No Pcp Per notified of death of 08/06/2019   History of present illness:  Kim CroftBarbara [name] is a 85 y.o. female with a history of atrial fibrillation, chronic systolic CHF, coronary artery disease, presenting to the emergency department on 07/27/2019 after she was found by family to be unresponsive.  Patient had last been seen approximately 3 days earlier.  EMS was called out, found the patient to be hypoxic, and she was brought into the ED.  Patient appeared to be septic from pneumonia, possibly complicated by acute MI, and was disoriented.  Her son, Theron Aristaeter, arrived to the hospital, explained that the patient's health has been progressively declining for a long time and she had made her wishes clear to her family that she would not want any type of heroic measures and would want to be DNR/DNI.  Her son felt that given the current state, patient would want us to focus on just keeping her comfortable.  Patient was brought into the hospital for comfort care, and she expired at 445 the following morning.  Final Diagnoses:  1.   Sepsis secondary to pneumonia   The results of significant diagnostics from this hospitalization (including imaging, microbiology, ancillary and laboratory) are listed below for reference.    Significant Diagnostic Studies: Ct Head Wo Contrast  Result Date: 07/25/2019 CLINICAL DATA:  Fall.  Altered mental status. EXAM: CT HEAD WITHOUT CONTRAST CT CERVICAL SPINE WITHOUT CONTRAST TECHNIQUE: Multidetector CT imaging of the head and cervical spine was performed following the standard protocol without intravenous contrast. Multiplanar CT image reconstructions of the cervical spine were also generated. COMPARISON:  CT head dated December 04, 2007. FINDINGS: CT HEAD FINDINGS Brain: No  evidence of acute infarction, hemorrhage, hydrocephalus, extra-axial collection or mass lesion/mass effect. Progressive moderate atrophy. Vascular: Atherosclerotic vascular calcification of the carotid siphons. No hyperdense vessel. Skull: Normal. Negative for fracture or focal lesion. Sinuses/Orbits: No acute finding. Diffuse paranasal sinus mucosal thickening. The mastoid air cells are clear. Other: None. CT CERVICAL SPINE FINDINGS Alignment: Normal. Skull base and vertebrae: No acute fracture. No primary bone lesion or focal pathologic process. Soft tissues and spinal canal: No prevertebral fluid or swelling. No visible canal hematoma. Disc levels: Multilevel spondylosis with at least mild spinal canal stenosis at C3-C4, C4-C5, and C5-C6. Moderate right greater than left neuroforaminal stenosis at C4-C5 and C5-C6 due to uncovertebral hypertrophy. Upper chest: Centrilobular emphysema. Other: Negative. IMPRESSION: 1.  No acute intracranial abnormality. 2.  No acute cervical spine fracture. 3.  Emphysema (ICD10-J43.9). Electronically Signed   By: Obie DredgeWilliam T Derry M.D.   On: 10/28/2018 19:58   Ct Cervical Spine Wo Contrast  Result Date: 08/06/2019 CLINICAL DATA:  Fall.  Altered mental status. EXAM: CT HEAD WITHOUT CONTRAST CT CERVICAL SPINE WITHOUT CONTRAST TECHNIQUE: Multidetector CT imaging of the head and cervical spine was performed following the standard protocol without intravenous contrast. Multiplanar CT image reconstructions of the cervical spine were also generated. COMPARISON:  CT head dated December 04, 2007. FINDINGS: CT HEAD FINDINGS Brain: No evidence of acute infarction, hemorrhage, hydrocephalus, extra-axial collection or mass lesion/mass effect. Progressive moderate atrophy. Vascular: Atherosclerotic vascular calcification of the carotid siphons. No hyperdense vessel. Skull: Normal. Negative for fracture or focal lesion. Sinuses/Orbits: No acute finding. Diffuse paranasal sinus mucosal  thickening. The mastoid air cells are clear. Other: None. CT  CERVICAL SPINE FINDINGS Alignment: Normal. Skull base and vertebrae: No acute fracture. No primary bone lesion or focal pathologic process. Soft tissues and spinal canal: No prevertebral fluid or swelling. No visible canal hematoma. Disc levels: Multilevel spondylosis with at least mild spinal canal stenosis at C3-C4, C4-C5, and C5-C6. Moderate right greater than left neuroforaminal stenosis at C4-C5 and C5-C6 due to uncovertebral hypertrophy. Upper chest: Centrilobular emphysema. Other: Negative. IMPRESSION: 1.  No acute intracranial abnormality. 2.  No acute cervical spine fracture. 3.  Emphysema (ICD10-J43.9). Electronically Signed   By: Titus Dubin M.D.   On: 07/28/2019 19:58   Dg Pelvis Portable  Result Date: 08/08/2019 CLINICAL DATA:  85 year old who was found on the ground earlier today, low core body temperature, possible fall. EXAM: PORTABLE PELVIS 1-2 VIEWS COMPARISON:  None. FINDINGS: No evidence of acute fracture. Hip joints anatomically aligned with symmetric mild joint space narrowing. Sacroiliac joints anatomically aligned with degenerative changes. Symphysis pubis anatomically aligned without degenerative changes. Mild degenerative changes involving the visualized lower lumbar spine. Aorto-iliofemoral atherosclerosis. IMPRESSION: 1. No acute osseous abnormality. 2. Symmetric mild osteoarthritis in both hips. Electronically Signed   By: Evangeline Dakin M.D.   On: 08/08/2019 18:53   Dg Chest Port 1 View  Result Date: 08/07/2019 CLINICAL DATA:  86 year old who was found on the ground earlier today, low core body temperature, with shortness of breath. EXAM: PORTABLE CHEST 1 VIEW COMPARISON:  02/24/2018 and earlier. FINDINGS: Prior sternotomy for CABG and aortic valve replacement. Cardiac silhouette upper normal in size to slightly enlarged for AP portable technique, unchanged. LEFT subclavian dual lead transvenous pacemaker  unchanged and appears intact. Patchy ground-glass airspace opacities scattered throughout both lungs, predominantly in the RIGHT lung base. No confluent airspace consolidation. No visible pleural effusions. No pneumothorax. IMPRESSION: 1. Patchy ground-glass airspace opacities throughout both lungs, predominantly in the RIGHT lung base, likely indicating pneumonia. 2. Stable borderline to mild cardiomegaly without pulmonary edema. Electronically Signed   By: Evangeline Dakin M.D.   On: 08/07/2019 18:51    Microbiology: Recent Results (from the past 240 hour(s))  Culture, blood (Routine X 2) w Reflex to ID Panel     Status: None (Preliminary result)   Collection Time: 07/21/2019  6:00 PM   Specimen: BLOOD  Result Value Ref Range Status   Specimen Description BLOOD LEFT UPPER ARM  Final   Special Requests   Final    BOTTLES DRAWN AEROBIC AND ANAEROBIC Blood Culture adequate volume   Culture   Final    NO GROWTH < 24 HOURS Performed at [facility], [place] [address] [place], [place] [address]    Report Status PENDING  Incomplete  SARS Coronavirus 2 [facility] order, Performed in [facility] hospital lab) Nasopharyngeal Nasopharyngeal Swab     Status: None   Collection Time: 08/08/2019  6:22 PM   Specimen: Nasopharyngeal Swab  Result Value Ref Range Status   SARS Coronavirus 2 NEGATIVE NEGATIVE Final    Comment: (NOTE) If result is NEGATIVE SARS-CoV-2 target nucleic acids are NOT DETECTED. The SARS-CoV-2 RNA is generally detectable in upper and lower  respiratory specimens during the acute phase of infection. The lowest  concentration of SARS-CoV-2 viral copies this assay can detect is 250  copies / mL. A negative result does not preclude SARS-CoV-2 infection  and should not be used as the sole basis for treatment or other  patient management decisions.  A negative result may occur with  improper specimen collection / handling, submission of  specimen other  than nasopharyngeal swab,  presence of viral mutation(s) within the  areas targeted by this assay, and inadequate number of viral copies  (<250 copies / mL). A negative result must be combined with clinical  observations, patient history, and epidemiological information. If result is POSITIVE SARS-CoV-2 target nucleic acids are DETECTED. The SARS-CoV-2 RNA is generally detectable in upper and lower  respiratory specimens dur ing the acute phase of infection.  Positive  results are indicative of active infection with SARS-CoV-2.  Clinical  correlation with patient history and other diagnostic information is  necessary to determine patient infection status.  Positive results do  not rule out bacterial infection or co-infection with other viruses. If result is PRESUMPTIVE POSTIVE SARS-CoV-2 nucleic acids MAY BE PRESENT.   A presumptive positive result was obtained on the submitted specimen  and confirmed on repeat testing.  While 2019 novel coronavirus  (SARS-CoV-2) nucleic acids may be present in the submitted sample  additional confirmatory testing may be necessary for epidemiological  and / or clinical management purposes  to differentiate between  SARS-CoV-2 and other Sarbecovirus currently known to infect humans.  If clinically indicated additional testing with an alternate test  methodology [phone]) is advised. The SARS-CoV-2 RNA is generally  detectable in upper and lower respiratory sp ecimens during the acute  phase of infection. The expected result is Negative. Fact Sheet for Patients:  BoilerBrush.com.cy Fact Sheet for Healthcare Providers: https://pope.com/ This test is not yet approved or cleared by the [place] FDA and has been authorized for detection and/or diagnosis of SARS-CoV-2 by FDA under an Emergency Use Authorization (EUA).  This EUA will remain in effect (meaning this test can be used) for the duration of the COVID-19 declaration under  Section 564(b)(1) of the Act, 21 U.S.C. section 360bbb-3(b)(1), unless the authorization is terminated or revoked sooner. Performed at [facility], [address], [place], [place] [address]   Culture, blood (Routine X 2) w Reflex to ID Panel     Status: None (Preliminary result)   Collection Time: July 15, 2019  6:29 PM   Specimen: BLOOD LEFT WRIST  Result Value Ref Range Status   Specimen Description BLOOD LEFT WRIST  Final   Special Requests   Final    AEROBIC BOTTLE ONLY Blood Culture results may not be optimal due to an inadequate volume of blood received in culture bottles   Culture   Final    NO GROWTH < 24 HOURS Performed at [facility], [address], [place], [place] [address]    Report Status PENDING  Incomplete     Labs: Basic Metabolic Panel: Recent Labs  Lab 07/11/2019 1801 15-Jul-2019 1832 07/25/2019 1935  NA 146* 148* 149*  K 4.6 4.4 3.6  CL 110  --   --   CO2 13*  --   --   GLUCOSE 81  --   --   BUN 43*  --   --   CREATININE 1.18*  --   --   CALCIUM 9.7  --   --    Liver Function Tests: Recent Labs  Lab 08/05/2019 1801  AST QUANTITY NOT SUFFICIENT, UNABLE TO PERFORM TEST  ALT QUANTITY NOT SUFFICIENT, UNABLE TO PERFORM TEST  ALKPHOS 71  BILITOT QUANTITY NOT SUFFICIENT, UNABLE TO PERFORM TEST  PROT 7.4  ALBUMIN 3.6   No results for input(s): LIPASE, AMYLASE in the last 168 hours. No results for input(s): AMMONIA in the last 168 hours. CBC: Recent  Labs  Lab 2019/08/05 1801 08-05-19 1832 2019-08-05 1935  WBC 28.3*  --   --   HGB 16.6* 16.0* 13.6  HCT 52.2* 47.0* 40.0  MCV 101.4*  --   --   PLT 210  --   --    Cardiac Enzymes: Recent Labs  Lab 08/05/2019 1801  CKTOTAL 788*   D-Dimer No results for input(s): DDIMER in the last 72 hours. BNP: Invalid input(s): POCBNP CBG: Recent Labs  Lab 08-05-19 1955  GLUCAP 74   Anemia work up No results for input(s): VITAMINB12, FOLATE, FERRITIN, TIBC, IRON, RETICCTPCT in the last 72  hours. Urinalysis    Component Value Date/Time   COLORURINE YELLOW 2019-08-05 1840   APPEARANCEUR CLOUDY (A) 08-05-2019 1840   LABSPEC 1.018 2019-08-05 1840   PHURINE 5.0 08-05-19 1840   GLUCOSEU NEGATIVE 2019-08-05 1840   HGBUR SMALL (A) 2019/08/05 1840   BILIRUBINUR NEGATIVE 08/05/19 1840   KETONESUR 20 (A) 08/05/2019 1840   PROTEINUR 100 (A) 2019-08-05 1840   UROBILINOGEN 0.2 11/23/2013 0759   NITRITE NEGATIVE August 05, 2019 1840   LEUKOCYTESUR NEGATIVE 2019/08/05 1840   Sepsis Labs Invalid input(s): PROCALCITONIN,  WBC,  LACTICIDVEN  On behalf of all of [place] who had the privilege of caring for this fine lady, our thoughts and prayers are with her family and friends as the mourn her passing.    SIGNED:  Briscoe Deutscher, MD  [facility] 07/13/2019, 7:16 PM Pager   If 7PM-7AM, please contact night-coverage [email or web address] Password [email or web address]

## 2019-08-09 NOTE — Progress Notes (Signed)
Death verified with Reginold Agent, RN.

## 2019-08-09 NOTE — Progress Notes (Addendum)
[employer] called to request pacemaker turn off. Spoke to rep who put request in. Chartered certified accountant called back to state the type of pacemaker patient has doesn't need to be turned off and that everything is good. Patient doesn't have that type of defibrillator.

## 2019-08-09 NOTE — Progress Notes (Signed)
Patient expired time of death 44. MD paged. Patients son Collier Salina notified. Waiting on family to arrive.

## 2019-08-09 DEATH — deceased
